# Patient Record
Sex: Female | Born: 1945 | Race: White | Hispanic: No | Marital: Married | Smoking: Current every day smoker
Health system: Southern US, Community
[De-identification: ages and names within clinical notes are randomized; demographics above are authoritative.]

## PROBLEM LIST (undated history)

## (undated) DIAGNOSIS — K219 Gastro-esophageal reflux disease without esophagitis: Secondary | ICD-10-CM

## (undated) DIAGNOSIS — T8859XA Other complications of anesthesia, initial encounter: Secondary | ICD-10-CM

## (undated) DIAGNOSIS — E781 Pure hyperglyceridemia: Secondary | ICD-10-CM

## (undated) DIAGNOSIS — T4145XA Adverse effect of unspecified anesthetic, initial encounter: Secondary | ICD-10-CM

## (undated) DIAGNOSIS — G4730 Sleep apnea, unspecified: Secondary | ICD-10-CM

## (undated) DIAGNOSIS — E039 Hypothyroidism, unspecified: Secondary | ICD-10-CM

## (undated) DIAGNOSIS — J449 Chronic obstructive pulmonary disease, unspecified: Secondary | ICD-10-CM

## (undated) DIAGNOSIS — H269 Unspecified cataract: Secondary | ICD-10-CM

## (undated) DIAGNOSIS — J45909 Unspecified asthma, uncomplicated: Secondary | ICD-10-CM

## (undated) DIAGNOSIS — R011 Cardiac murmur, unspecified: Secondary | ICD-10-CM

## (undated) DIAGNOSIS — F419 Anxiety disorder, unspecified: Secondary | ICD-10-CM

## (undated) DIAGNOSIS — E7800 Pure hypercholesterolemia, unspecified: Secondary | ICD-10-CM

## (undated) DIAGNOSIS — M797 Fibromyalgia: Secondary | ICD-10-CM

## (undated) DIAGNOSIS — I10 Essential (primary) hypertension: Secondary | ICD-10-CM

## (undated) DIAGNOSIS — R0602 Shortness of breath: Secondary | ICD-10-CM

## (undated) HISTORY — PX: ABDOMINAL HYSTERECTOMY: SHX81

## (undated) HISTORY — PX: CARPAL TUNNEL RELEASE: SHX101

## (undated) HISTORY — PX: TONSILLECTOMY: SUR1361

## (undated) HISTORY — PX: BACK SURGERY: SHX140

## (undated) HISTORY — PX: TUBAL LIGATION: SHX77

---

## 2002-09-25 ENCOUNTER — Encounter: Payer: Self-pay | Admitting: Neurosurgery

## 2002-09-26 ENCOUNTER — Ambulatory Visit (HOSPITAL_COMMUNITY): Admission: RE | Admit: 2002-09-26 | Discharge: 2002-09-26 | Payer: Self-pay | Admitting: Neurosurgery

## 2002-09-26 ENCOUNTER — Encounter: Payer: Self-pay | Admitting: Neurosurgery

## 2003-05-08 ENCOUNTER — Encounter: Payer: Self-pay | Admitting: Neurosurgery

## 2003-05-08 ENCOUNTER — Encounter: Admission: RE | Admit: 2003-05-08 | Discharge: 2003-05-08 | Payer: Self-pay | Admitting: Neurosurgery

## 2003-05-31 ENCOUNTER — Encounter: Payer: Self-pay | Admitting: Cardiology

## 2003-06-08 ENCOUNTER — Inpatient Hospital Stay (HOSPITAL_COMMUNITY): Admission: RE | Admit: 2003-06-08 | Discharge: 2003-06-10 | Payer: Self-pay | Admitting: Neurosurgery

## 2003-06-08 ENCOUNTER — Encounter: Payer: Self-pay | Admitting: Neurosurgery

## 2003-07-11 ENCOUNTER — Encounter: Payer: Self-pay | Admitting: Neurosurgery

## 2003-07-11 ENCOUNTER — Encounter: Admission: RE | Admit: 2003-07-11 | Discharge: 2003-07-11 | Payer: Self-pay | Admitting: Neurosurgery

## 2003-09-11 ENCOUNTER — Encounter: Admission: RE | Admit: 2003-09-11 | Discharge: 2003-09-11 | Payer: Self-pay | Admitting: Neurosurgery

## 2004-04-27 ENCOUNTER — Encounter: Payer: Self-pay | Admitting: Cardiology

## 2004-05-01 ENCOUNTER — Encounter: Payer: Self-pay | Admitting: Cardiology

## 2004-07-16 ENCOUNTER — Ambulatory Visit: Payer: Self-pay | Admitting: Cardiology

## 2004-07-17 ENCOUNTER — Encounter: Payer: Self-pay | Admitting: Cardiology

## 2004-07-21 ENCOUNTER — Inpatient Hospital Stay (HOSPITAL_BASED_OUTPATIENT_CLINIC_OR_DEPARTMENT_OTHER): Admission: RE | Admit: 2004-07-21 | Discharge: 2004-07-21 | Payer: Self-pay | Admitting: Cardiology

## 2004-07-28 ENCOUNTER — Ambulatory Visit: Payer: Self-pay | Admitting: Cardiology

## 2004-10-13 ENCOUNTER — Encounter: Payer: Self-pay | Admitting: Cardiology

## 2004-11-04 ENCOUNTER — Encounter: Admission: RE | Admit: 2004-11-04 | Discharge: 2004-11-04 | Payer: Self-pay | Admitting: Neurosurgery

## 2004-11-20 ENCOUNTER — Encounter: Admission: RE | Admit: 2004-11-20 | Discharge: 2004-11-20 | Payer: Self-pay | Admitting: Neurosurgery

## 2004-12-03 ENCOUNTER — Encounter: Admission: RE | Admit: 2004-12-03 | Discharge: 2004-12-03 | Payer: Self-pay | Admitting: Neurosurgery

## 2004-12-19 ENCOUNTER — Encounter: Admission: RE | Admit: 2004-12-19 | Discharge: 2004-12-19 | Payer: Self-pay | Admitting: Neurosurgery

## 2005-08-17 ENCOUNTER — Encounter: Admission: RE | Admit: 2005-08-17 | Discharge: 2005-08-17 | Payer: Self-pay | Admitting: Neurosurgery

## 2006-01-18 ENCOUNTER — Encounter
Admission: RE | Admit: 2006-01-18 | Discharge: 2006-01-18 | Payer: Self-pay | Admitting: Physical Medicine and Rehabilitation

## 2006-07-23 ENCOUNTER — Encounter: Admission: RE | Admit: 2006-07-23 | Discharge: 2006-07-23 | Payer: Self-pay | Admitting: Neurology

## 2006-11-03 ENCOUNTER — Ambulatory Visit: Payer: Self-pay | Admitting: Vascular Surgery

## 2006-11-03 ENCOUNTER — Ambulatory Visit (HOSPITAL_COMMUNITY): Admission: RE | Admit: 2006-11-03 | Discharge: 2006-11-03 | Payer: Self-pay | Admitting: Neurology

## 2007-04-10 ENCOUNTER — Emergency Department (HOSPITAL_COMMUNITY): Admission: EM | Admit: 2007-04-10 | Discharge: 2007-04-10 | Payer: Self-pay | Admitting: *Deleted

## 2007-04-19 ENCOUNTER — Encounter
Admission: RE | Admit: 2007-04-19 | Discharge: 2007-04-19 | Payer: Self-pay | Admitting: Physical Medicine and Rehabilitation

## 2007-12-28 ENCOUNTER — Encounter: Admission: RE | Admit: 2007-12-28 | Discharge: 2007-12-28 | Payer: Self-pay | Admitting: Neurosurgery

## 2008-01-30 ENCOUNTER — Inpatient Hospital Stay (HOSPITAL_COMMUNITY): Admission: RE | Admit: 2008-01-30 | Discharge: 2008-01-31 | Payer: Self-pay | Admitting: Neurosurgery

## 2008-06-17 ENCOUNTER — Encounter: Payer: Self-pay | Admitting: Cardiology

## 2008-06-20 ENCOUNTER — Encounter: Payer: Self-pay | Admitting: Cardiology

## 2008-06-26 ENCOUNTER — Encounter: Admission: RE | Admit: 2008-06-26 | Discharge: 2008-06-26 | Payer: Self-pay | Admitting: Neurosurgery

## 2008-09-03 IMAGING — CR DG CHEST 2V
2 series · 2 of 2 positions shown · non-contrast
Comparison: Report of a prior chest x-ray 09/25/2002.

CLINICAL DATA: Preop for spinal stenosis

CHEST - 2 VIEW

[view not recorded (1 of 2)]
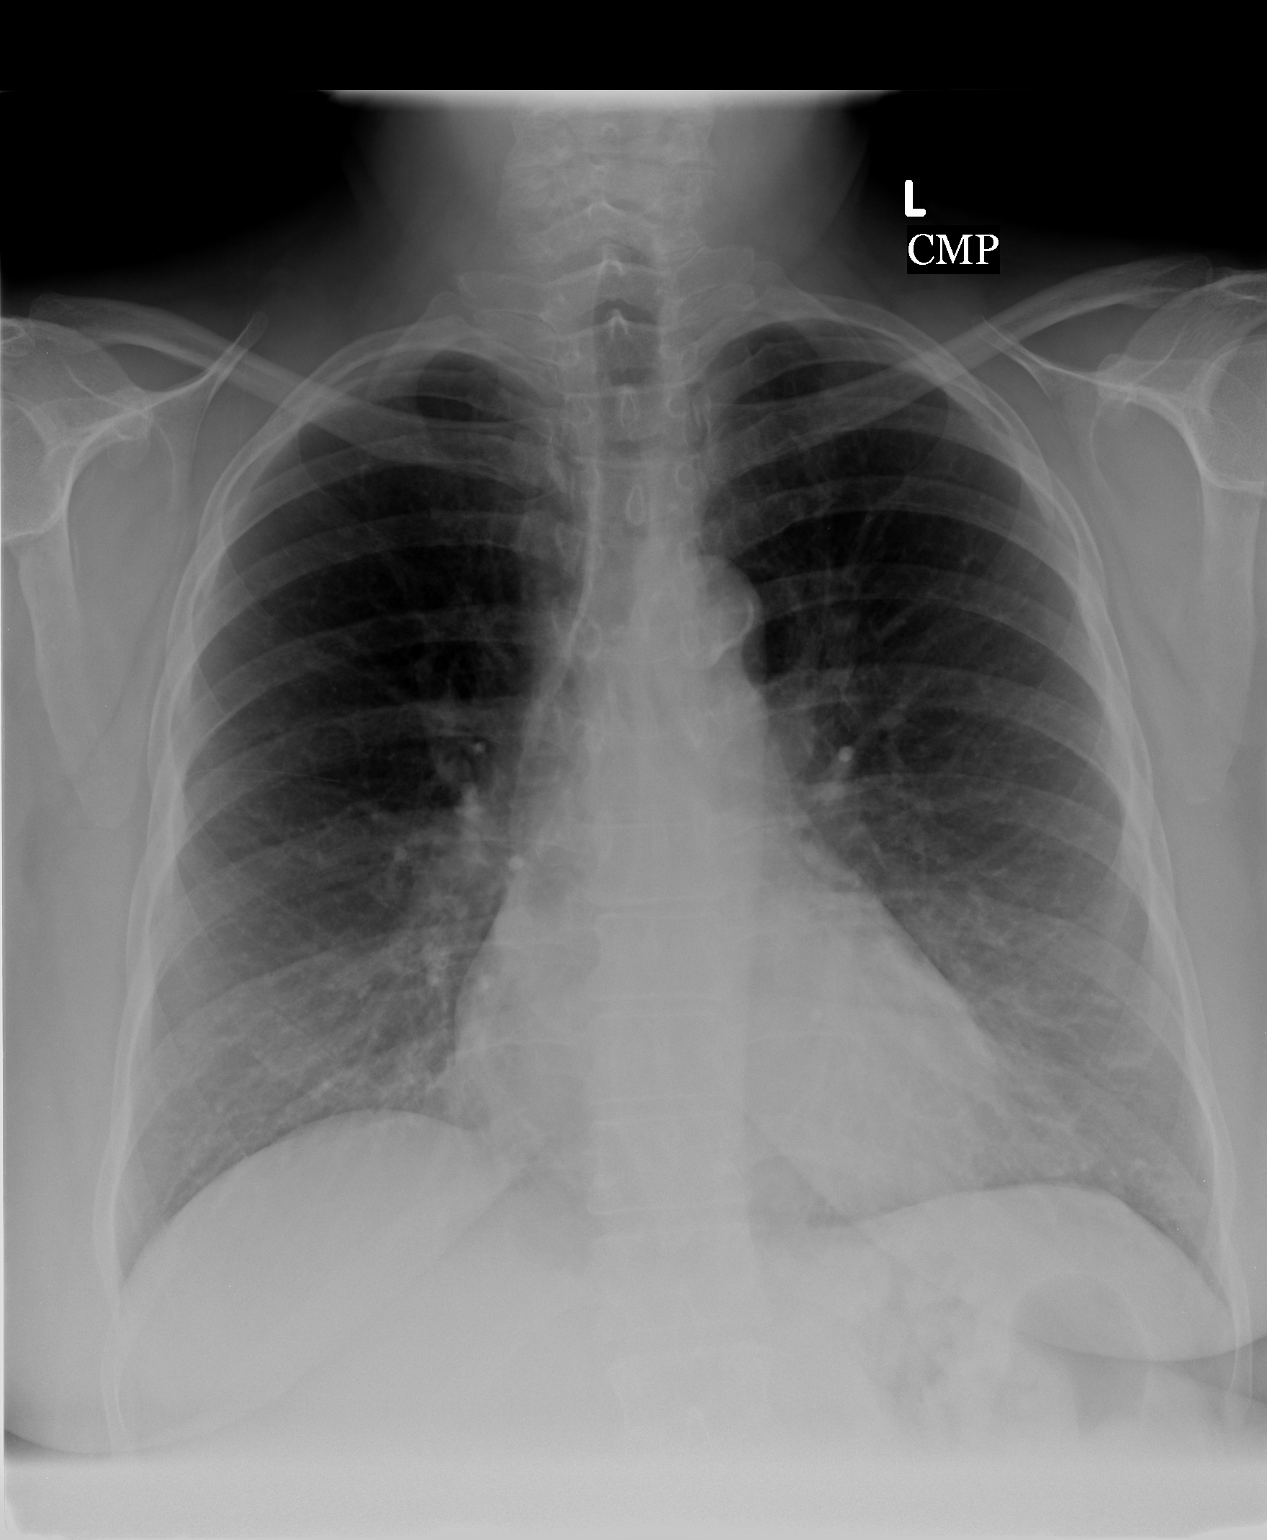

[view not recorded (2 of 2)]
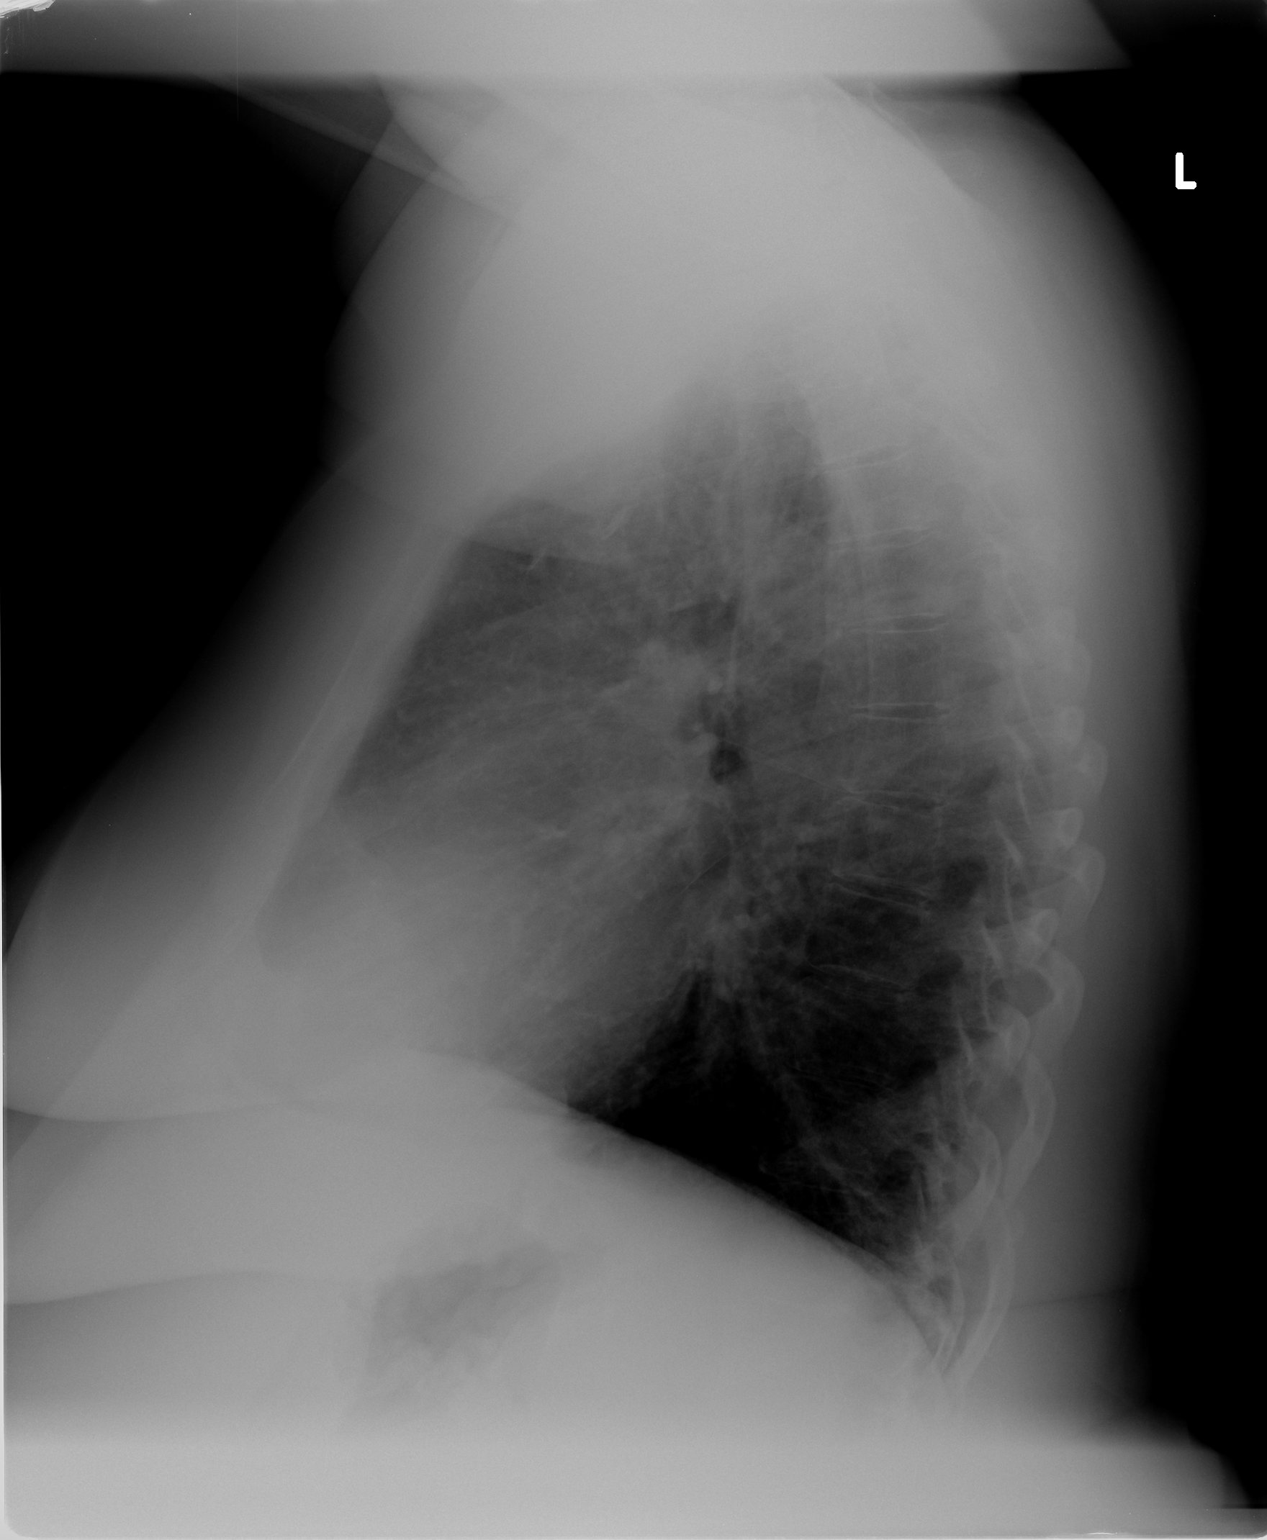

[2 of 2 positions shown; findings below may reference images not displayed]

FINDINGS: Heart size upper normal.  There are prominent lung
markings without active airspace disease.  The lungs are
hyperaerated.  There is no acute process.  Osseous structures
intact.
IMPRESSION: 1.  Heart size upper normal.
2.  Chronic lung changes.
3.  No active disease.

## 2009-04-22 ENCOUNTER — Encounter: Admission: RE | Admit: 2009-04-22 | Discharge: 2009-04-22 | Payer: Self-pay | Admitting: Neurosurgery

## 2009-07-29 ENCOUNTER — Inpatient Hospital Stay (HOSPITAL_COMMUNITY): Admission: RE | Admit: 2009-07-29 | Discharge: 2009-08-01 | Payer: Self-pay | Admitting: Neurosurgery

## 2009-08-21 ENCOUNTER — Encounter: Admission: RE | Admit: 2009-08-21 | Discharge: 2009-08-21 | Payer: Self-pay | Admitting: Neurosurgery

## 2009-11-08 ENCOUNTER — Encounter: Admission: RE | Admit: 2009-11-08 | Discharge: 2009-11-08 | Payer: Self-pay | Admitting: Neurosurgery

## 2009-12-19 ENCOUNTER — Encounter: Payer: Self-pay | Admitting: Cardiology

## 2010-01-08 ENCOUNTER — Encounter: Payer: Self-pay | Admitting: Cardiology

## 2010-03-12 ENCOUNTER — Encounter: Payer: Self-pay | Admitting: Cardiology

## 2010-03-21 ENCOUNTER — Encounter: Payer: Self-pay | Admitting: Cardiology

## 2010-04-08 DIAGNOSIS — I10 Essential (primary) hypertension: Secondary | ICD-10-CM | POA: Insufficient documentation

## 2010-04-08 DIAGNOSIS — F411 Generalized anxiety disorder: Secondary | ICD-10-CM | POA: Insufficient documentation

## 2010-04-08 DIAGNOSIS — R079 Chest pain, unspecified: Secondary | ICD-10-CM

## 2010-04-08 DIAGNOSIS — F17200 Nicotine dependence, unspecified, uncomplicated: Secondary | ICD-10-CM

## 2010-04-08 DIAGNOSIS — E039 Hypothyroidism, unspecified: Secondary | ICD-10-CM | POA: Insufficient documentation

## 2010-04-08 DIAGNOSIS — E119 Type 2 diabetes mellitus without complications: Secondary | ICD-10-CM

## 2010-04-11 ENCOUNTER — Ambulatory Visit: Payer: Self-pay | Admitting: Cardiology

## 2010-04-11 ENCOUNTER — Encounter (INDEPENDENT_AMBULATORY_CARE_PROVIDER_SITE_OTHER): Payer: Self-pay | Admitting: *Deleted

## 2010-04-11 DIAGNOSIS — E669 Obesity, unspecified: Secondary | ICD-10-CM

## 2010-04-11 DIAGNOSIS — R011 Cardiac murmur, unspecified: Secondary | ICD-10-CM

## 2010-04-11 DIAGNOSIS — E781 Pure hyperglyceridemia: Secondary | ICD-10-CM

## 2010-04-11 DIAGNOSIS — R0989 Other specified symptoms and signs involving the circulatory and respiratory systems: Secondary | ICD-10-CM | POA: Insufficient documentation

## 2010-04-14 ENCOUNTER — Encounter: Payer: Self-pay | Admitting: Cardiology

## 2010-04-24 ENCOUNTER — Ambulatory Visit: Payer: Self-pay | Admitting: Cardiology

## 2010-04-24 ENCOUNTER — Encounter: Payer: Self-pay | Admitting: Cardiology

## 2010-05-12 ENCOUNTER — Encounter: Payer: Self-pay | Admitting: Cardiology

## 2010-05-13 ENCOUNTER — Ambulatory Visit: Payer: Self-pay | Admitting: Cardiology

## 2010-05-13 DIAGNOSIS — R0609 Other forms of dyspnea: Secondary | ICD-10-CM

## 2010-05-15 ENCOUNTER — Encounter: Payer: Self-pay | Admitting: Cardiology

## 2010-05-28 ENCOUNTER — Telehealth (INDEPENDENT_AMBULATORY_CARE_PROVIDER_SITE_OTHER): Payer: Self-pay | Admitting: *Deleted

## 2010-08-29 ENCOUNTER — Encounter: Payer: Self-pay | Admitting: Cardiology

## 2010-10-28 NOTE — Progress Notes (Signed)
Summary: Office Visit/ LETTER TO DR. [facility]  Office Visit/ LETTER TO DR. [facility]   Imported By: Dorise Hiss 04/08/2010 14:32:05  _____________________________________________________________________  External Attachment:    Type:   Image     Comment:   External Document

## 2010-10-28 NOTE — Cardiovascular Report (Signed)
Summary: Cardiac Catheterization  Cardiac Catheterization   Imported By: Dorise Hiss 04/08/2010 14:07:40  _____________________________________________________________________  External Attachment:    Type:   Image     Comment:   External Document

## 2010-10-28 NOTE — Letter (Signed)
Summary: [facility] Medical Office Visit Note   [facility] Medical Office Visit Note   Imported By: Roderic Ovens 05/21/2010 11:37:06  _____________________________________________________________________  External Attachment:    Type:   Image     Comment:   External Document

## 2010-10-28 NOTE — Letter (Signed)
Summary: External Correspondence/ FAXED DR. Peterson Ao  External Correspondence/ FAXED DR. Peterson Ao   Imported By: Dorise Hiss 04/14/2010 12:09:33  _____________________________________________________________________  External Attachment:    Type:   Image     Comment:   External Document

## 2010-10-28 NOTE — Letter (Signed)
Summary: External Correspondence/ FAXED REFERRAL MEDICAL NUTRITION THERAP  External Correspondence/ FAXED REFERRAL MEDICAL NUTRITION THERAPY   Imported By: Dorise Hiss 05/23/2010 16:49:38  _____________________________________________________________________  External Attachment:    Type:   Image     Comment:   External Document

## 2010-10-28 NOTE — Assessment & Plan Note (Signed)
Summary: 1 MO F/U PER 7/15 OV-   Visit Type:  Follow-up Primary Provider:  Dr. Sherryll Burger  CC:  Hypertriglyceridemia.  History of Present Illness: The patient presents for followup of dyspnea and chest discomfort. She also has severe hypertriglyceridemia. I sent her to [facility] for management of this and she has been started on fabric acid and fish oil. I did order an echocardiogram which demonstrated normal left ventricular systolic function and no valvular abnormalities. Stress perfusion study suggested no ischemia. She did have some mild carotid plaque less than 50% on the left. She has had no change in her dyspnea and is not describing any PND or orthopnea. She is not having any chest pressure, neck or arm discomfort. She is not having any palpitations, presyncope or syncope. She continues to smoke cigarettes.  Preventive Screening-Counseling & Management  Alcohol-Tobacco     Smoking Status: current     Smoking Cessation Counseling: yes     Packs/Day: 1 PPD  Current Medications (verified): 1)  Klor-Con 10 10 Meq Cr-Tabs (Potassium Chloride) .... Take 1 Tablet By Mouth Once A Day 2)  Hyzaar 100-25 Mg Tabs (Losartan Potassium-Hctz) .... Take 1 Tablet By Mouth Once A Day 3)  Alprazolam 0.5 Mg Tabs (Alprazolam) .... Take 1 Tablet By Mouth Two Times A Day As Needed 4)  Proair Hfa 108 (90 Base) Mcg/act Aers (Albuterol Sulfate) .Marland Kitchen.. 1 Puffs Four Times A Day As Needed 5)  Spiriva Handihaler 18 Mcg Caps (Tiotropium Bromide Monohydrate) .... One Inhalation Daily 6)  Omeprazole 20 Mg Cpdr (Omeprazole) .... Take 1 Tablet By Mouth Two Times A Day 7)  Drisdol 04540 Unit Caps (Ergocalciferol) .... Take One By Mouth Weekly 8)  Metformin Hcl 500 Mg Tabs (Metformin Hcl) .... Take 1 Tablet By Mouth Three Times A Day 9)  Provigil 200 Mg Tabs (Modafinil) .... Take 1/2 Tablet By Mouth Once A Day 10)  Levothyroxine Sodium 150 Mcg Tabs (Levothyroxine Sodium) .... Take 1 Tablet By Mouth Once A Day 11)  Lyrica  75 Mg Caps (Pregabalin) .... Take 1 Tablet By Mouth Two Times A Day 12)  Amlodipine Besylate 5 Mg Tabs (Amlodipine Besylate) .... Take 1 Tablet By Mouth Once A Day 13)  Gabapentin 300 Mg Caps (Gabapentin) .... Take 1 Tablet By Mouth Three Times A Day 14)  Crestor 10 Mg Tabs (Rosuvastatin Calcium) .... Take 1 Tablet By Mouth Once A Day 15)  Metoprolol Succinate 100 Mg Xr24h-Tab (Metoprolol Succinate) .... Take 1 Tablet By Mouth Two Times A Day 16)  Furosemide 40 Mg Tabs (Furosemide) .... Take 1 Tablet By Mouth Once A Day 17)  Nabumetone 750 Mg Tabs (Nabumetone) .... Take 1 Tablet By Mouth Two Times A Day 18)  Cymbalta 60 Mg Cpep (Duloxetine Hcl) .... Take 1 Tablet By Mouth Once A Day 19)  Morphine Sulfate 15 Mg Tabs (Morphine Sulfate) .... Take 1 Tablet By Mouth Three Times A Day 20)  Morphine Sulfate 30 Mg Tabs (Morphine Sulfate) .... Take 1 Tablet By Mouth Three Times A Day  Allergies (verified): No Known Drug Allergies  Comments:  Nurse/Medical Assistant: The patient's medications and allergies were reviewed with the patient and were updated in the Medication and Allergy Lists.  Past History:  Past Medical History: Reviewed history from 04/11/2010 and no changes required. Hypertension Hypothyroidism Hypertriglyceridemia Chronic back pain Tobacco abuse Diabetes mellitus Sleep apnea Morbid obesity  Past Surgical History: Reviewed history from 04/11/2010 and no changes required. Cardiac Catheterization-10/13/2004 Left L3-4 extraforaminal microdiskectomy. 2003 L3-4  stenosis status post L4-5 fusion.   Social History: Packs/Day:  1 PPD  Review of Systems       As stated in the HPI and negative for all other systems.   Vital Signs:  Patient profile:   65 year old female Height:      61 inches Weight:      181 pounds Pulse rate:   82 / minute BP sitting:   130 / 75  (left arm) Cuff size:   large  Vitals Entered By: Carlye Grippe (May 13, 2010 11:50 AM)  Physical  Exam  General:  Well developed, well nourished, in no acute distress. Head:  normocephalic and atraumatic Neck:  Neck supple, no JVD. No masses, thyromegaly or abnormal cervical nodes. Chest Wall:  no deformities or breast masses noted Lungs:  Clear bilaterally to auscultation and percussion. Abdomen:  Bowel sounds positive; abdomen soft and non-tender without masses, organomegaly, or hernias noted. No hepatosplenomegaly, obese Msk:  Back normal, normal gait. Muscle strength and tone normal. Extremities:  No clubbing or cyanosis. Neurologic:  Alert and oriented x 3. Psych:  Normal affect.   Detailed Cardiovascular Exam  Neck    Carotids: right carotid bruit versus transmitted systolic murmur, otherwise unremarkable    Neck Veins: Normal, no JVD.    Heart    Inspection: no deformities or lifts noted.      Palpation: normal PMI with no thrills palpable.      Auscultation: S1 and S2 within normal limits, no S3, no S4, 2/6 apical systolic murmur heard best at the right upper sternal border, early peaking, no diastolic murmurs.  Vascular    Abdominal Aorta: no palpable masses, pulsations, or audible bruits.      Femoral Pulses: normal femoral pulses bilaterally.      Pedal Pulses: normal pedal pulses bilaterally.      Radial Pulses: normal radial pulses bilaterally.      Peripheral Circulation: no clubbing, cyanosis, or edema noted with normal capillary refill.     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 We will continue with risk reduction.  Problem # 2:  TOBACCO ABUSE (ICD-305.1) I spent greater than 3 minutes discussing the need to stop smoking. Hopefully she will comply.  Problem # 3:  HYPERTENSION (ICD-401.9) Her blood pressure is controlled. She will continue the meds as listed.  Problem # 4:  HYPERTRIGLYCERIDEMIA (ICD-272.1) Her lipids will be followed by the lipid clinic at  [facility]. She has labs scheduled for October.  Problem # 5:  DYSPNEA (ICD-786.05) This is likely related to her long-standing smoking. No further cardiac workup is suggested.  Patient Instructions: 1)  Your physician wants you to follow-up in: 6 months. You will receive a reminder letter in the mail one-two months in advance. If you don't receive a letter, please call our office to schedule the follow-up appointment. 2)  Your physician recommends that you continue on your current medications as directed. Please refer to the Current Medication list given to you today. 3)  Your physician discussed the hazards of tobacco use.  Tobacco use cessation is recommended and techniques and options to help you quit were discussed.  Appended Document: Orders Update    Clinical Lists Changes  Orders: Added new Referral order of Nutrition Referral (Nutrition) - Signed

## 2010-10-28 NOTE — Letter (Signed)
Summary: [facility] D/C DR. Beatrix Fetters [facility]  [facility] D/C DR. Kirstie Peri   Imported By: Zachary George 04/08/2010 13:14:31  _____________________________________________________________________  External Attachment:    Type:   Image     Comment:   External Document

## 2010-10-28 NOTE — Progress Notes (Signed)
Summary: Nutritional Referral  Phone Note Outgoing Call   Call placed by: Cyril Loosen, RN, BSN,  May 28, 2010 12:04 PM Call placed to: Patient Summary of Call: Spoke with patient who states nutritional appt has been set up. Initial call taken by: Cyril Loosen, RN, BSN,  May 28, 2010 12:04 PM

## 2010-10-28 NOTE — Assessment & Plan Note (Signed)
Summary: NP-FOLLOW UP ON TRIGLYCERIDES ELEV   Visit Type:  Initial Consult Primary Provider:  Dr. Sherryll Burger  CC:  Chest pain and SOB .  History of Present Illness: The patient presents for evaluation of shortness of breath and chest discomfort and also for management of severe hypertriglyceridemia. She has had cardiovascular workups in the past for complaints of chest discomfort. Treadmill in 2005 was negative. She subsequently had a cardiac catheterization in 2006 demonstrating scattered nonobstructive plaque. She presents now with progressive dyspnea. This has been slow over years but now she is dyspneic to the point of being short of breath walking to her house. She does not describe resting shortness of breath, PND or orthopnea. She does occasionally have some palpitations but no presyncope or syncope. She has chest discomfort that is alternately sharp and heavy. This happens sporadically. She cannot bring this on she is very limited in activity secondary to chronic back pain. She does not describe associated symptoms with this. She does not describe radiation of discomfort to her jaw or arms though she will have neck pain associated with what she thinks her muscle spasms.  Of note the patient has a history of hypertriglyceridemia. I did call the primary care office and reviewed labs that included a total cholesterol of 213, HDL 16, triglycerides 1494. Direct LDL was not obtained. TSH was borderline at 0.4. Sodium was 133.    Preventive Screening-Counseling & Management  Alcohol-Tobacco     Smoking Status: current     Smoking Cessation Counseling: yes     Packs/Day: 1&1/2 PPD  Current Medications (verified): 1)  Klor-Con 10 10 Meq Cr-Tabs (Potassium Chloride) .... Take 1 Tablet By Mouth Once A Day 2)  Hyzaar 100-25 Mg Tabs (Losartan Potassium-Hctz) .... Take 1 Tablet By Mouth Once A Day 3)  Alprazolam 0.5 Mg Tabs (Alprazolam) .... Take 1 Tablet By Mouth Two Times A Day As Needed 4)  Proair  Hfa 108 (90 Base) Mcg/act Aers (Albuterol Sulfate) .Marland Kitchen.. 1 Puffs Four Times A Day As Needed 5)  Spiriva Handihaler 18 Mcg Caps (Tiotropium Bromide Monohydrate) .... One Inhalation Daily 6)  Omeprazole 20 Mg Cpdr (Omeprazole) .... Take 1 Tablet By Mouth Two Times A Day 7)  Drisdol 16109 Unit Caps (Ergocalciferol) .... Take One By Mouth Weekly 8)  Metformin Hcl 500 Mg Tabs (Metformin Hcl) .... Take 1 Tablet By Mouth Three Times A Day 9)  Provigil 200 Mg Tabs (Modafinil) .... Take 1/2 Tablet By Mouth Once A Day 10)  Levothyroxine Sodium 150 Mcg Tabs (Levothyroxine Sodium) .... Take 1 Tablet By Mouth Once A Day 11)  Lyrica 75 Mg Caps (Pregabalin) .... Take 1 Tablet By Mouth Two Times A Day 12)  Amlodipine Besylate 5 Mg Tabs (Amlodipine Besylate) .... Take 1 Tablet By Mouth Once A Day 13)  Gabapentin 300 Mg Caps (Gabapentin) .... Take 1 Tablet By Mouth Three Times A Day 14)  Crestor 10 Mg Tabs (Rosuvastatin Calcium) .... Take 1 Tablet By Mouth Once A Day 15)  Metoprolol Succinate 100 Mg Xr24h-Tab (Metoprolol Succinate) .... Take 1 Tablet By Mouth Two Times A Day 16)  Furosemide 40 Mg Tabs (Furosemide) .... Take 1 Tablet By Mouth Once A Day 17)  Nabumetone 750 Mg Tabs (Nabumetone) .... Take 1 Tablet By Mouth Two Times A Day 18)  Cymbalta 60 Mg Cpep (Duloxetine Hcl) .... Take 1 Tablet By Mouth Once A Day 19)  Morphine Sulfate 15 Mg Tabs (Morphine Sulfate) .... Take 1 Tablet By Mouth Three Times  A Day 20)  Morphine Sulfate 30 Mg Tabs (Morphine Sulfate) .... Take 1 Tablet By Mouth Three Times A Day  Allergies (verified): No Known Drug Allergies  Comments:  Nurse/Medical Assistant: The patient's medication bottles and allergies were reviewed with the patient and were updated in the Medication and Allergy Lists.  Past History:  Past Medical History: Hypertension Hypothyroidism Hypertriglyceridemia Chronic back pain Tobacco abuse Diabetes mellitus Sleep apnea Morbid obesity  Past Surgical  History: Cardiac Catheterization-10/13/2004 Left L3-4 extraforaminal microdiskectomy. 2003 L3-4 stenosis status post L4-5 fusion.   Family History: Father deceased lung CA Mother:deceased GSW No early heart disease  Social History: Disabled  Alcohol Use - no Drug Use - no Tobacco 1 1/2 ppd x 36 years Smoking Status:  current Packs/Day:  1&1/2 PPD  Review of Systems       Positive for hand and feet swelling, joint pains, fatigue, sweats, headaches, 30 pound weight gain in 8 years, cataracts, anxiety, diffuse weakness, asthma, wheezing, frequent cough, stomach cramping. Otherwise as stated in the history of present illness negative for other systems.  Vital Signs:  Patient profile:   65 year old female Height:      61 inches Weight:      185 pounds BMI:     35.08 O2 Sat:      94 % on Room air Pulse rate:   78 / minute BP sitting:   139 / 79  (left arm) Cuff size:   large  Vitals Entered By: Carlye Grippe (April 11, 2010 1:27 PM)  Nutrition Counseling: Patient's BMI is greater than 25 and therefore counseled on weight management options.  O2 Flow:  Room air CC: Chest pain, SOB    Physical Exam  General:  Well developed, well nourished, in no acute distress. Head:  normocephalic and atraumatic Eyes:  PERRLA/EOM intact; conjunctiva and lids normal. Mouth:  Teeth, gums and palate normal. Oral mucosa normal. Neck:  Neck supple, no JVD. No masses, thyromegaly or abnormal cervical nodes. Chest Wall:  no deformities or breast masses noted Lungs:  Clear bilaterally to auscultation and percussion. Abdomen:  Bowel sounds positive; abdomen soft and non-tender without masses, organomegaly, or hernias noted. No hepatosplenomegaly, obese Msk:  Back normal, normal gait. Muscle strength and tone normal. Extremities:  No clubbing or cyanosis. Neurologic:  Alert and oriented x 3. Skin:  Intact without lesions or rashes. Cervical Nodes:  no significant adenopathy Axillary Nodes:  no  significant adenopathy Inguinal Nodes:  no significant adenopathy Psych:  Normal affect.   Detailed Cardiovascular Exam  Neck    Carotids: right carotid bruit versus transmitted systolic murmur, otherwise unremarkable    Neck Veins: Normal, no JVD.    Heart    Inspection: no deformities or lifts noted.      Palpation: normal PMI with no thrills palpable.      Auscultation: S1 and S2 within normal limits, no S3, no S4, 2/6 apical systolic murmur heard best at the right upper sternal border, early peaking, no diastolic murmurs.  Vascular    Abdominal Aorta: no palpable masses, pulsations, or audible bruits.      Femoral Pulses: normal femoral pulses bilaterally.      Pedal Pulses: normal pedal pulses bilaterally.      Radial Pulses: normal radial pulses bilaterally.      Peripheral Circulation: no clubbing, cyanosis, or edema noted with normal capillary refill.     EKG  Procedure date:  04/11/2010  Findings:      Sinus rhythm,  rate 79, axis within normal limits, intervals within normal limits, no acute ST-T wave changes.  Impression & Recommendations:  Problem # 1:  CHEST PAIN UNSPECIFIED (ICD-786.50) The patient has atypical chest pain. She does have progressive dyspnea which could be an anginal equivalent. She had some nonobstructive coronary disease several years ago and catheterization and significant ongoing risk factors. Stress testing is indicated but she would not be able to ambulate. Therefore, she can have a pharmacologic perfusion study Orders: EKG w/ Interpretation (93000) Nuclear Med (Nuc Med)  Problem # 2:  TOBACCO ABUSE (ICD-305.1) We had a long discussion about the need to stop smoking. We gave her a telephone number for support and help. She has failed pharmacologic and nicotine replacement therapies.  Problem # 3:  HYPERTRIGLYCERIDEMIA (ICD-272.1) This is a significant problem and puts her at risk for among other things pancreatitis.  I discussed at length  the need for a very low fat diet. Complicating her treatment is her polypharmacy. In addition she has diabetes. However, she will need combination therapy with niacin and fibroids. I think with the degree of her hypertriglyceridemia she would best be served in the lipid clinic and I have made this followup to [facility]. Orders: Misc. Referral (Misc. Ref)  Problem # 4:  CAROTID BRUIT (ICD-785.9) This may represent a transmitted murmur. However, she has significant vascular risk factors and so carotid Doppler is indicated. Orders: Carotid Duplex (Carotid Duplex)  Problem # 5:  MURMUR (ICD-785.2) I suspect aortic sclerosis or mild stenosis and will follow with an echocardiogram. Orders: 2-D Echocardiogram (2D Echo)  Problem # 6:  OBESITY, UNSPECIFIED (ICD-278.00) Frankly, and unfortunately, this is not her most significant problem and for now I will discuss it with her blood plan no specific therapy.  Patient Instructions: 1)  Your physician has requested that you have a carotid duplex. This test is an ultrasound of the carotid arteries in your neck. It looks at blood flow through these arteries that supply the brain with blood. Allow one hour for this exam. There are no restrictions or special instructions. 2)  Your physician has requested that you have an echocardiogram.  Echocardiography is a painless test that uses sound waves to create images of your heart. It provides your doctor with information about the size and shape of your heart and how well your heart's chambers and valves are working.  This procedure takes approximately one hour. There are no restrictions for this procedure. 3)  Your physician has requested that you have an Best boy.  For further information please visit [email or web address]  Please follow instruction sheet, as given. 4)  You have been referred to Lipid Clinic at [facility] are scheduled with Dr. Garey Ham on Monday, May 12, 2010 at  12:55pm. Their contact number is [phone]. They will mail you a new patient packet with a map for their location. 5)  Follow up with Dr. Antoine Poche on Tues, May 13, 2010 at 11:45am. 6)  Your physician discussed the hazards of tobacco use.  Tobacco use cessation is recommended and techniques and options to help you quit were discussed. 1-800-QUIT-NOW

## 2010-10-28 NOTE — Letter (Signed)
Summary: External Correspondence/ [facility]  External Correspondence/ [facility]   Imported By: Dorise Hiss 03/24/2010 09:21:43  _____________________________________________________________________  External Attachment:    Type:   Image     Comment:   External Document

## 2010-10-28 NOTE — Letter (Signed)
Summary: Lexiscan or Dobutamine Pharmacist, community at [facility]  [address]   [place], [place] [address]   Phone: [phone]  Fax: [phone]      [facility] Cardiovascular Services  Lexiscan or Dobutamine Cardiolite Strss Test    Patricia Stein  Appointment Date:_  Appointment Time:_  Your doctor has ordered a CARDIOLITE STRESS TEST using a medication to stimulate exercise so that you will not have to walk on the treadmill to determine the condition of your heart during stress. If you take blood pressure medication, ask your doctor if you should take it the day of your test. You should not have anything to eat or drink at least 4 hours before your test is scheduled, and no caffeine, including decaffeinated tea and coffee, chocolate, and soft drinks for 24 hours before your test.  You will need to register at the Outpatient/Main Entrance at the hospital 15 minutes before your appointment time. It is a good idea to bring a copy of your order with you. They will direct you to the Diagnostic Imaging (Radiology) Department.  You will be asked to undress from the waist up and given a hospital gown to wear, so dress comfortably from the waist down for example: Sweat pants, shorts, or skirt Rubber soled lace up shoes (tennis shoes)  Plan on about three hours from registration to release from the hospital  You may take all of your medications with water the morning of your test. However, if you do not eat breakfast the morning of your test, you may want to hold your diabetic medications until after the test. Also, you may want to hold Lasix the morning of the test. You can take these medications after the test.

## 2010-10-28 NOTE — Letter (Signed)
Summary: External Correspondence/ [facility]  External Correspondence/ [facility]   Imported By: Dorise Hiss 03/24/2010 09:20:13  _____________________________________________________________________  External Attachment:    Type:   Image     Comment:   External Document

## 2010-11-17 ENCOUNTER — Encounter: Payer: Self-pay | Admitting: Cardiology

## 2010-12-04 NOTE — Letter (Signed)
Summary: [facility]  [facility]   Imported By: Marylou Mccoy 11/28/2010 11:17:46  _____________________________________________________________________  External Attachment:    Type:   Image     Comment:   External Document

## 2010-12-31 LAB — GLUCOSE, CAPILLARY
Glucose-Capillary: 114 mg/dL — ABNORMAL HIGH (ref 70–99)
Glucose-Capillary: 120 mg/dL — ABNORMAL HIGH (ref 70–99)
Glucose-Capillary: 122 mg/dL — ABNORMAL HIGH (ref 70–99)
Glucose-Capillary: 134 mg/dL — ABNORMAL HIGH (ref 70–99)
Glucose-Capillary: 153 mg/dL — ABNORMAL HIGH (ref 70–99)

## 2011-01-01 LAB — BASIC METABOLIC PANEL
BUN: 8 mg/dL (ref 6–23)
Creatinine, Ser: 0.57 mg/dL (ref 0.4–1.2)
GFR calc Af Amer: >60 mL/min (ref >60)
GFR calc non Af Amer: >60 mL/min (ref >60)
Potassium: 3.6 mEq/L (ref 3.5–5.1)

## 2011-01-01 LAB — TYPE AND SCREEN
ABO/RH(D): O POS
Antibody Screen: NEGATIVE

## 2011-01-01 LAB — DIFFERENTIAL
Basophils Absolute: 0 10*3/uL (ref 0.0–0.1)
Basophils Relative: 0 % (ref 0–1)
Monocytes Absolute: 0.5 10*3/uL (ref 0.1–1.0)
Neutro Abs: 5.3 10*3/uL (ref 1.7–7.7)
Neutrophils Relative %: 60 % (ref 43–77)

## 2011-01-01 LAB — CBC
MCHC: 35.2 g/dL (ref 30.0–36.0)
Platelets: 170 10*3/uL (ref 150–400)
RDW: 14.6 % (ref 11.5–15.5)

## 2011-02-10 NOTE — Op Note (Signed)
Patricia Stein, Patricia Stein NO.:  [id number]   MEDICAL RECORD NO.:  [id number]          PATIENT TYPE:  INP   LOCATION:  3022                         FACILITY:  [facility]   PHYSICIAN:  Kathaleen Maser. Pool, M.D.    DATE OF BIRTH:  Apr 15, 1946   DATE OF PROCEDURE:  01/30/2008  DATE OF DISCHARGE:                               OPERATIVE REPORT   PREOPERATIVE DIAGNOSES:  L4-L5 degenerative disease with stenosis,  instability, status post L5-S1 decompression and fusion instrumentation.   POSTOPERATIVE DIAGNOSES:  L4-L5 degenerative disease with stenosis,  instability, status post L5-S1 decompression and fusion instrumentation.   PROCEDURE NOTE:  L4-L5 re-exploration of laminectomy with a redo  complete laminectomy with L4 and L5 foraminotomies, more than will be  required for simple interbody fusion alone.  L4-L5 posterior lumbar  revision utilizing tangent interbody allograft wedge, Telamon interbody  PEEK cage, and local autografting.  L4-L5 posterolateral arthrodesis  utilizing non-segmental pedicle fixation and local autografting.  Re-  exploration of L5-S1 fusion with removal of hardware.   SURGEON:  Kathaleen Maser. Pool, MD   ANESTHESIA:  General endotracheal.   INDICATION:  Ms. Tlatelpa is a 65 year old female status post previous L5-  S1 fusion.  Workup demonstrates evidence of instability and stenosis at  the L4-L5 level.  The patient has failed conservative management.  She  decides to proceed with L4-L5 decompression and fusion hopes of  improving her symptoms.   OPERATION PERFORMED:  The patient was brought to the operating room and  placed on the operating table in supine position.  After an adequate  level was achieved, the patient was prone onto Wilson frame firmly  padded.  The patient's lumbar regions prepped and draped sterilely.  A  10-blade was used to make curvilinear incision overlying the L4-L5  interspace.  This carried out sharply to the midline.  Subperiosteal  dissection was then performed exposing the lamina and facet joints of L3  and L4 as well as the previous laminectomy of L4-L5, and the pedicle  screw fixation at L4-L5.  A deep self-retainer was placed.  Intraoperative fluoroscopy was used and levels were confirmed.  Pedicle  screw fixation at L5-S1 was disassembled.  The fusion was explored at L5-  S1 and found to be solid.  Pedicle screws at S1 were removed.  Pedicle  screws at L5 were left in place.  The previous laminectomy at L5 was  dissected free to the level of inferior lamina of L4.  Complete  laminectomy of L4 was then performed using Leksell rongeurs, Kerrison  rongeurs, and high-speed drill.  All elements of the lamina were  completely removed.  All inferior facetectomies of L4 were performed  bilaterally.  Superior facetectomies of L5 were performed bilaterally.  Ligament flavum and epidural scar was elevated and resected in the usual  fashion using Kerrison rongeurs.  Wide decompressive foraminotomy was  then performed along course of exiting L4 and L5 nerve roots  bilaterally.  Epidural venous plexus was coagulated and cut.  So, I  infused the patient's left side thecal sac nerve roots gently and  mobilized, and  tracked towards the middle.  Disk space was then incised  with 15-blade in a rectangular fashion to widen disk space.  Clean-out  was achieved.  Using pituitary rongeurs, upward angled pituitary  rongeurs, and Epstein curettes.  Procedure was then repeated on the  contralateral side.  Disk space was then distracted up to 10 mm and 10-  mm distractor was left in place.  On starting first, distractor on the  patient's right side, thecal sac nerve root was checked in the left  side.  Disk space was then reamed and cut with a 10-mm tangent  instrument.  Soft tissue was then removed from the interspace.  A 10 x  26-mm Telamon cage packed with morselized autograft and Pro[employer]ix putty  was then packed into place, recessed  approximately 1 mm from posterior  cortical margin of L4.  Distractor was removed from the patient's right  side of L4-L5.  Disk space was then reamed and cut with a 10-mm tangent  instruments.  Soft tissues was then removed from the interspace.  A  morselized autograft was then packed in the interspace and then a 10 x  26-mm tangent wedge was then packed into place, and recessed  approximately 1 mm from the posterior cortical margin of L4.  Pedicles  of L4 were then identified using surface landmarks with an  intraoperative fluoroscopy.  Superficial bone around the pedicle was  then removed using high-speed drill.  The pedicles was then probed using  pedicle awl.  Each pedicle awl tract was then tapped 5.25 mm screw tap.  Each screw tap hole was probed and found to be solid within bone.  The  6.75 x 45-mm radius screws were placed bilaterally at L4.  Transverse  processes were then decorticated at L4 and L5.  Morselized autograft was  packed posterolaterally for later fusion.  Short segment of titanium  rods were then placed over the screw heads.  Locking caps were then  placed over the screw head.  Locking caps then engaged with construct  under compression.  Final images revealed a good position of bone grafts  and hardware with proper operative level and normal alignment of spine.  Wound was then irrigated with antibiotic solution.  Gelfoam was placed  topically.  Hemostasis was found to be good.  A medium Hemovac drain was  left in the epidural space.  The wounds was then closed in layers with  Vicryl suture.  Steri-Strips and sterile dressings were applied.  There  are no complications.  She tolerated the procedure well and she returns  to recovery room postoperatively.           ______________________________  Kathaleen Maser Pool, M.D.     HAP/MEDQ  D:  01/30/2008  T:  01/31/2008  Job:  [id number]

## 2011-02-13 NOTE — Cardiovascular Report (Signed)
Patricia Stein, Patricia Stein NO.:  [id number]   MEDICAL RECORD NO.:  [id number]          PATIENT TYPE:  OIB   LOCATION:  6501                         FACILITY:  [facility]   PHYSICIAN:  Learta Codding, M.D. LHCDATE OF BIRTH:  [birth year]   DATE OF PROCEDURE:  10/13/2004  DATE OF DISCHARGE:  07/21/2004                              CARDIAC CATHETERIZATION   PROCEDURE:  1.  Left heart catheterization with selective coronary angiography.  2.  Ventriculography.   DIAGNOSIS:  Nonobstructive coronary artery disease.   INDICATIONS FOR PROCEDURE:  The patient is a 65 year old female with history  of substernal chest pain.  The patient has been referred for cardiac  catheterization to evaluate her coronary anatomy.   DESCRIPTION OF PROCEDURE:  After informed consent was obtained, the patient  was brought to the catheterization laboratory.  Standard catheters were used  for coronary angiography.  Ventriculography was performed in a single-plane  RAO projection.  No complications were occurred during the procedure.   FINDINGS:  1.  Hemodynamics:  Left ventricular pressure 119/8 mmHg, arterial pressure      119/70 mmHg.  2.  Ventriculography:  Ejection fraction 60%, no wall motion abnormalities.  3.  Selective coronary angiography:  The left main coronary artery was a      large caliber vessel with no evidence of flow limiting disease.  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  The diagonal      branch was free of flow limiting disease.  The circumflex coronary      artery had a proximal 30% stenosis.  The marginal branches were free of      flow-limiting disease.  Right coronary artery is a large caliber vessel      with some narrowing of approximately 20 to 30% in the midsection of the      vessel.   RECOMMENDATIONS:  Angiographic images were reviewed and relayed to Dr. Sherryll Burger.  The  patient will be switched from Lisinopril to an ARB secondary to cough.  Continue medical therapy for hypertriglyceridemia and hypercholesterolemia  as indicated.      GED/MEDQ  D:  10/13/2004  T:  10/13/2004  Job:  [id number]

## 2011-02-13 NOTE — Op Note (Signed)
NAME:  Patricia Stein, Patricia Stein                        ACCOUNT NO.:  [id number]   MEDICAL RECORD NO.:  [id number]                   PATIENT TYPE:  OIB   LOCATION:  3172                                 FACILITY:  [facility]   PHYSICIAN:  Kathaleen Maser. Pool, M.D.                 DATE OF BIRTH:  August 04, 1946   DATE OF PROCEDURE:  09/26/2002  DATE OF DISCHARGE:                                 OPERATIVE REPORT   PREOPERATIVE DIAGNOSES:  Left L3-4 extraforaminal herniated nucleus pulposus  with radiculopathy.   POSTOPERATIVE DIAGNOSES:  Left L3-4 extraforaminal herniated nucleus  pulposus with radiculopathy.   OPERATION PERFORMED:  Left L3-4 extraforaminal microdiskectomy.   SURGEON:  Kathaleen Maser. Pool, M.D.   ASSISTANT:  Donalee Citrin, M.D.   ANESTHESIA:  General endotracheal.   INDICATIONS FOR PROCEDURE:  The patient is a 65 year old female with a  history of back and left lower extremity pain consistent with left-sided L3  radiculopathy which has failed conservative management.  MRI scan  demonstrates a focal left-sided L3-4 extraforaminal disk herniation with  compression exiting left-sided L3 nerve root.  We discussed the options  available for management.  The patient has decided to proceed with  extraforaminal microdiskectomy for hopeful improvement in her symptoms.   DESCRIPTION OF PROCEDURE:  The patient was taken to the operating room and  placed on the table in the supine position.  After adequate level of  anesthesia was achieved, the patient was positioned prone onto a Wilson  frame and appropriately padded.  The patient's lumbar region was prepped and  draped sterilely.  A 10 blade was used to make a linear skin incision  overlying the  interspace.  This was carried down sharply in the midline.  A  subperiosteal dissection was then performed exposing the lamina and facet  joints of L3 and L4 as well as the transverse process at L3.  Deep self-  retaining retractor was placed.  Intraoperative  x-ray was taken, the level  was confirmed.  Extraforaminal approach was then made using the high speed  drill to remove the lateral aspect of the inferior facet of L3 and the  superior facet of L4 as well as the lateral aspect of the pars  interarticularis.  The intertransverse ligament was then elevated and  resected in piecemeal fashion using Kerrison rongeurs.   The microscope was brought into the field and used for microdissection of  the left-sided L3 nerve root and disk herniation.  After clamp was placed  this was coagulated and cut.  The L3 nerve root was identified.  It was  mobilized gradually superiorly.  Disk herniation was readily apparent.  This  was incised with a 15 blade in rectangular fashion.  A wide disk space  cleanout was then achieved pituitary rongeurs and upward angled pituitary  rongeurs and Epstein curets.  All elements of the disk herniation were  completely resected.  The  disk space was entered and all loose or obviously  degenerative disk material was removed from within the disk space.  At this  point a very thorough diskectomy had been performed.  There was no evidence  of any further compression of thecal sac or nerve roots.  The wound was then  irrigated with antibiotic solution.  Gelfoam was placed topically for  hemostasis which was found to be good.  Microscope and retractor system were  removed.  Hemostasis of the muscle was achieved with electrocautery.  The  wound was then closed in layers with Vicryl sutures.  Steri-Strips and  sterile  dressing were applied.  There were no apparent complications.  The patient  tolerated the procedure well and returned to the recovery room  postoperatively.                                                Henry A. Pool, M.D.    HAP/MEDQ  D:  09/26/2002  T:  09/26/2002  Job:  [id number]

## 2011-02-13 NOTE — Op Note (Signed)
NAME:  Patricia Stein, Patricia Stein                        ACCOUNT NO.:  [id number]   MEDICAL RECORD NO.:  [id number]                   PATIENT TYPE:  INP   LOCATION:  3010                                 FACILITY:  [facility]   PHYSICIAN:  Kathaleen Maser. Pool, M.D.                 DATE OF BIRTH:  [birth year]   DATE OF PROCEDURE:  06/08/2003  DATE OF DISCHARGE:                                 OPERATIVE REPORT   PREOPERATIVE DIAGNOSIS:  L5-S1 degenerative disk disease with chronic pain.   POSTOPERATIVE DIAGNOSIS:  L5-S1 degenerative disk disease with chronic pain.   PROCEDURES:  1. L5-S1 decompressive laminectomy with foraminotomies, followed by     posterior lumbar fusion utilizing Tangent wedges and local autograft.  2. Posterolateral fusion using pedicle screw instrumentation and local     autograft.   SURGEON:  Kathaleen Maser. Pool, M.D.   ASSISTANT:  Donalee Citrin, M.D.   ANESTHESIA:  General endotracheal.   INDICATIONS:  Patricia Stein is a 65 year old female with a history of severe  back and left lower extremity pain failing all conservative management.  Workup has demonstrated evidence of very severe degenerative disk disease  with complete disk space collapse at the L5-S1 level.  Discography produces  a concordant pain response and is negative at the L3-4 and L4-5 levels.  The  patient has been counseled as to her options. She has decided to proceed  with an L5-S1 decompression and fusion for hopeful improvement of her  symptoms.   OPERATIVE NOTE:  The patient was taken to the operating room and placed on  the operating room table in a supine position.  After an adequate level of  anesthesia achieved, the patient was placed prone on the Wilson frame,  appropriately padded.  The patient's lumbar region was prepped and draped  sterilely.  A 10 blade was used to make a linear skin incision overlying the  L5 and S1 levels.  This was carried down sharply in the midline.  A  subperiosteal dissection was then  performed, exposing the laminae and facet  joints at L4, L5, and S1, as well as the transverse processes of L5 and the  sacral alae bilaterally.  A deep self-retaining retractor was placed,  intraoperative x-ray was taken, and the level was confirmed.  Complete  laminectomy of L5 was then performed using Leksell rongeurs, Kerrison  rongeurs, and the high-speed drill.  Complete inferior facetectomies of L5  were performed bilaterally and complete superior facetectomies at S1 were  performed bilaterally.  All bone was cleaned and used in later autograft.  The ligamentum flavum was elevated and resected in piecemeal fashion.  The  underlying thecal sac and exiting L5 and S1 nerve roots were widely  decompressed.  Epidural venous plexus was coagulated and cut.  The disk  space was isolated and incised with a 15 blade in rectangular fashion.  A  wide disk space clean-out  was then achieved using pituitary rongeurs, upward-  angled pituitary rongeurs, and Epstein curettes.  This was accomplished  bilaterally.  The disk space was then dilated up to 8 mm and then the 8 mm  dilator was left in the patient's right side.  The thecal sac and nerve  roots were protected on the left.  The disk space was then cut with an 8 mm  box cutter and then cut with an 8 mm chisel.  Soft tissue was removed from  the interspace.  An 8 x 26 mm Tangent wedge was then impacted into place,  recessed approximately 2 mm from the posterior cortical margin.  The  distractor was removed from the patient's right side.  Thecal sac and nerve  roots were protected on the right.  The disk space was then once again cut  with the 8 mm box cutter and cut with an 8 mm chisel.  Soft tissue was  removed.  The disk space was further curetted.  Morcellized autograft was  packed in the interspace.  A second 8 x 26 mm Tangent wedge was then  impacted into place and recessed approximately 1 mm from the posterior  cortical margin.  The  pedicles at L5 and S1 were then isolated and confirmed  using surface landmarks and intraoperative fluoroscopy.  Superficial bone  overlying the pedicles was removed using the high-speed drill.  Each pedicle  was then probed using a pedicle awl.  Each pedicle awl track was found to be  solidly within bone.  Each pedicle awl track was then tapped with a 5.25 mm  screw tap.  Each screw tap hole was found to be solidly within bone.  Spiral  90 6.75 x 40 mm screws were placed bilaterally at L5 and 6.75 x 35 mm screws  were placed bilaterally at S1.  The transverse processes and sacral alae  were then decorticated using the high-speed drill.  Morcellized autograft  was packed posterolaterally.  A short segment of titanium rod was then  contoured and placed through the screw heads at L5 and S1.  Locking caps  were then placed on the screw heads.  The locking caps were then engaged in  a sequential fashion with the construct under compression.  Final images  revealed good position of bone grafts and hardware, proper operative level,  with normal alignment of the spine.  The wound was then irrigated and then  closed in typical fashion.  A medium Hemovac drain was left in the epidural  space.  There were no apparent complications.  The patient tolerated the  procedure well, and she returns to the recovery room postop.                                               Henry A. Pool, M.D.    HAP/MEDQ  D:  06/08/2003  T:  06/09/2003  Job:  [id number]

## 2011-05-04 ENCOUNTER — Other Ambulatory Visit: Payer: Self-pay | Admitting: Neurological Surgery

## 2011-05-04 DIAGNOSIS — M542 Cervicalgia: Secondary | ICD-10-CM

## 2011-05-04 DIAGNOSIS — M545 Low back pain: Secondary | ICD-10-CM

## 2011-05-07 ENCOUNTER — Other Ambulatory Visit: Payer: Self-pay | Admitting: Neurological Surgery

## 2011-05-07 DIAGNOSIS — M545 Low back pain: Secondary | ICD-10-CM

## 2011-05-07 DIAGNOSIS — M542 Cervicalgia: Secondary | ICD-10-CM

## 2011-05-08 ENCOUNTER — Ambulatory Visit
Admission: RE | Admit: 2011-05-08 | Discharge: 2011-05-08 | Disposition: A | Discharge status: Home / Self Care | Payer: Medicare Other | Source: Ambulatory Visit | Attending: Neurological Surgery | Admitting: Neurological Surgery

## 2011-05-08 DIAGNOSIS — M545 Low back pain, unspecified: Secondary | ICD-10-CM

## 2011-05-08 DIAGNOSIS — M542 Cervicalgia: Secondary | ICD-10-CM

## 2011-06-23 LAB — CBC
HCT: 38.6
MCV: 91.7
Platelets: 217
RBC: 4.21
WBC: 8.4

## 2011-06-23 LAB — BASIC METABOLIC PANEL
BUN: 7
CO2: 32
Chloride: 94 — ABNORMAL LOW
Potassium: 3 — ABNORMAL LOW

## 2011-06-23 LAB — DIFFERENTIAL
Eosinophils Absolute: 0.2
Eosinophils Relative: 2
Lymphs Abs: 2.9
Monocytes Relative: 6

## 2011-06-23 LAB — ABO/RH: ABO/RH(D): O POS

## 2011-07-14 LAB — URINALYSIS, ROUTINE W REFLEX MICROSCOPIC
Glucose, UA: NEGATIVE
Hgb urine dipstick: NEGATIVE
Ketones, ur: NEGATIVE
Protein, ur: NEGATIVE

## 2011-07-22 ENCOUNTER — Other Ambulatory Visit: Payer: Self-pay | Admitting: Neurosurgery

## 2011-07-22 DIAGNOSIS — M545 Low back pain, unspecified: Secondary | ICD-10-CM

## 2011-07-29 ENCOUNTER — Ambulatory Visit
Admission: RE | Admit: 2011-07-29 | Discharge: 2011-07-29 | Disposition: A | Discharge status: Home / Self Care | Payer: Medicare Other | Source: Ambulatory Visit | Attending: Neurosurgery | Admitting: Neurosurgery

## 2011-07-29 DIAGNOSIS — M545 Low back pain, unspecified: Secondary | ICD-10-CM

## 2011-09-25 ENCOUNTER — Encounter (HOSPITAL_COMMUNITY): Payer: Self-pay | Admitting: Pharmacy Technician

## 2011-10-05 ENCOUNTER — Encounter (HOSPITAL_COMMUNITY)
Admission: RE | Admit: 2011-10-05 | Discharge: 2011-10-05 | Disposition: A | Discharge status: Home / Self Care | Payer: Medicare Other | Source: Ambulatory Visit | Attending: Anesthesiology | Admitting: Anesthesiology

## 2011-10-05 ENCOUNTER — Other Ambulatory Visit: Payer: Self-pay

## 2011-10-05 ENCOUNTER — Encounter (HOSPITAL_COMMUNITY): Payer: Self-pay

## 2011-10-05 ENCOUNTER — Encounter (HOSPITAL_COMMUNITY)
Admission: RE | Admit: 2011-10-05 | Discharge: 2011-10-05 | Disposition: A | Discharge status: Home / Self Care | Payer: Medicare Other | Source: Ambulatory Visit | Attending: Neurosurgery | Admitting: Neurosurgery

## 2011-10-05 HISTORY — DX: Pure hypercholesterolemia, unspecified: E78.00

## 2011-10-05 HISTORY — DX: Chronic obstructive pulmonary disease, unspecified: J44.9

## 2011-10-05 HISTORY — DX: Shortness of breath: R06.02

## 2011-10-05 HISTORY — DX: Other complications of anesthesia, initial encounter: T88.59XA

## 2011-10-05 HISTORY — DX: Essential (primary) hypertension: I10

## 2011-10-05 HISTORY — DX: Cardiac murmur, unspecified: R01.1

## 2011-10-05 HISTORY — DX: Anxiety disorder, unspecified: F41.9

## 2011-10-05 HISTORY — DX: Adverse effect of unspecified anesthetic, initial encounter: T41.45XA

## 2011-10-05 HISTORY — DX: Sleep apnea, unspecified: G47.30

## 2011-10-05 HISTORY — DX: Hypothyroidism, unspecified: E03.9

## 2011-10-05 LAB — BASIC METABOLIC PANEL
GFR calc Af Amer: >90 mL/min (ref >90)
GFR calc non Af Amer: >90 mL/min (ref >90)
Glucose, Bld: 108 mg/dL — ABNORMAL HIGH (ref 70–99)
Potassium: 3.7 mEq/L (ref 3.5–5.1)
Sodium: 135 mEq/L (ref 135–145)

## 2011-10-05 LAB — TYPE AND SCREEN: ABO/RH(D): O POS

## 2011-10-05 LAB — SURGICAL PCR SCREEN: Staphylococcus aureus: NEGATIVE

## 2011-10-05 LAB — CBC
HCT: 44.5 % (ref 36.0–46.0)
Hemoglobin: 15.8 g/dL — ABNORMAL HIGH (ref 12.0–15.0)
MCV: 91.9 fL (ref 78.0–100.0)
Platelets: 137 10*3/uL — ABNORMAL LOW (ref 150–400)
RBC: 4.84 MIL/uL (ref 3.87–5.11)
WBC: 6.9 10*3/uL (ref 4.0–10.5)

## 2011-10-05 LAB — DIFFERENTIAL
Eosinophils Relative: 4 % (ref 0–5)
Lymphocytes Relative: 48 % — ABNORMAL HIGH (ref 12–46)
Lymphs Abs: 3.3 10*3/uL (ref 0.7–4.0)

## 2011-10-05 MED ORDER — CHLORHEXIDINE GLUCONATE 4 % EX LIQD: 1.0000 "application " | Freq: Once | CUTANEOUS | Status: DC

## 2011-10-05 NOTE — Progress Notes (Signed)
Spoke with Revonda Standard, [place] regarding pt's history and anesthesia history. Sleep study, echo and stress test from [facility] then [place] will review.

## 2011-10-05 NOTE — Pre-Procedure Instructions (Signed)
20 Patricia Stein  10/05/2011   Your procedure is scheduled on:  October 13, 2011  Report to [facility] at 0530 AM.  Call this number if you have problems the morning of surgery: [phone]   Remember:   Do not eat food:After Midnight.  May have clear liquids: up to 4 Hours before arrival.  Clear liquids include soda, tea, black coffee, apple or grape juice, broth.  Take these medicines the morning of surgery with A SIP OF WATER: Albuterol, Xanax, Norvasc, Levothyroxine, Morphine, Prilosec   Do not wear jewelry, make-up or nail polish.  Do not wear lotions, powders, or perfumes. You may wear deodorant.  Do not shave 48 hours prior to surgery.  Do not bring valuables to the hospital.  Contacts, dentures or bridgework may not be worn into surgery.  Leave suitcase in the car. After surgery it may be brought to your room.  For patients admitted to the hospital, checkout time is 11:00 AM the day of discharge.   Patients discharged the day of surgery will not be allowed to drive home.  Special Instructions: CHG Shower Use Special Wash: 1/2 bottle night before surgery and 1/2 bottle morning of surgery.   Please read over the following fact sheets that you were given: Pain Booklet, Coughing and Deep Breathing, Blood Transfusion Information and Surgical Site Infection Prevention

## 2011-10-06 ENCOUNTER — Encounter (HOSPITAL_COMMUNITY): Payer: Self-pay | Admitting: Vascular Surgery

## 2011-10-06 NOTE — Consult Note (Signed)
Anesthesia:  Patient is a 66 year old female scheduled for a L3-5 posterior lateral arthrodesis with pedicle screws on 10/13/11.  Her medical history includes smoking, DM2, hypothyroidism, COPD, OSA, anxiety, HTN, and hypercholesterolemia.    She also reported hypotension in PACU and welts from an undetermined source after her last surgery.  (The Anesthesia records from her last two procedures can be found in her old medical charts that are with her current chart for the Anesthesiologist to review on the day of surgery.)  Her Cardiologist is Dr. Antoine Poche.  He last saw her in August of 2011.  She had a stress test at [facility] on 04/24/10 that showed probably normal LV perfusion.  Global LV systolic function was normal, EF 66%, normal wall motion, small, fixed apical defect consistent with attenuation artifact.  Echo on that same day showed EF 60-65%, normal LV systolic function, trace MR.  Carotid duplex showed no right ICA stenosis and < 50% left ICA stenosis.   Her last cath was done in 2006 and showed: 1. Hemodynamics: Left ventricular pressure 119/8 mmHg, arterial pressure 119/70 mmHg.  2. Ventriculography: Ejection fraction 60%, no wall motion abnormalities.  3. Selective coronary angiography: The left main coronary artery was a large caliber vessel with no evidence of flow limiting disease. 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 The diagonal branch was free of flow limiting disease. The circumflex coronary  artery had a proximal 30% stenosis. The marginal branches were free of flow-limiting disease. Right coronary artery is a large caliber vessel with some narrowing of approximately 20 to 30% in the midsection of the vessel.  Her EKG from 10/05/11 showed NSR, poor anterior R wave progression, but it was not felt significantly changed from her EKG on 07/23/09.  CXR from 10/05/11 showed no active disease.  Stable central mild bronchitic changes.  Labs reviewed.  Her last sleep study is also on her chart.  Plan to proceed in no acute cardiopulmonary symptoms.

## 2011-10-12 MED ORDER — CEFAZOLIN SODIUM-DEXTROSE 2-3 GM-% IV SOLR
2.0000 g | INTRAVENOUS | Status: AC
  Administered 2011-10-13: 2 g via INTRAVENOUS
  Filled 2011-10-12: qty 50

## 2011-10-12 MED ORDER — DEXAMETHASONE SODIUM PHOSPHATE 10 MG/ML IJ SOLN
10.0000 mg | Freq: Once | INTRAMUSCULAR | Status: AC
  Administered 2011-10-13: 10 mg via INTRAVENOUS
  Filled 2011-10-12: qty 1

## 2011-10-13 ENCOUNTER — Encounter (HOSPITAL_COMMUNITY): Payer: Self-pay | Admitting: Vascular Surgery

## 2011-10-13 ENCOUNTER — Ambulatory Visit (HOSPITAL_COMMUNITY): Payer: Medicare Other | Admitting: Vascular Surgery

## 2011-10-13 ENCOUNTER — Encounter (HOSPITAL_COMMUNITY): Payer: Self-pay | Admitting: *Deleted

## 2011-10-13 ENCOUNTER — Ambulatory Visit (HOSPITAL_COMMUNITY): Payer: Medicare Other

## 2011-10-13 ENCOUNTER — Encounter (HOSPITAL_COMMUNITY): Payer: Self-pay | Admitting: Neurosurgery

## 2011-10-13 ENCOUNTER — Inpatient Hospital Stay (HOSPITAL_COMMUNITY)
Admission: RE | Admit: 2011-10-13 | Discharge: 2011-10-14 | DRG: 460 | Disposition: A | Discharge status: Home / Self Care | Payer: Medicare Other | Source: Ambulatory Visit | Attending: Neurosurgery | Admitting: Neurosurgery

## 2011-10-13 ENCOUNTER — Encounter (HOSPITAL_COMMUNITY)
Admission: RE | Disposition: A | Discharge status: Home / Self Care | Payer: Self-pay | Source: Ambulatory Visit | Attending: Neurosurgery

## 2011-10-13 DIAGNOSIS — G4730 Sleep apnea, unspecified: Secondary | ICD-10-CM | POA: Diagnosis present

## 2011-10-13 DIAGNOSIS — Z79899 Other long term (current) drug therapy: Secondary | ICD-10-CM

## 2011-10-13 DIAGNOSIS — X58XXXA Exposure to other specified factors, initial encounter: Secondary | ICD-10-CM | POA: Diagnosis present

## 2011-10-13 DIAGNOSIS — F411 Generalized anxiety disorder: Secondary | ICD-10-CM | POA: Diagnosis present

## 2011-10-13 DIAGNOSIS — Z23 Encounter for immunization: Secondary | ICD-10-CM

## 2011-10-13 DIAGNOSIS — S32009A Unspecified fracture of unspecified lumbar vertebra, initial encounter for closed fracture: Principal | ICD-10-CM | POA: Diagnosis present

## 2011-10-13 DIAGNOSIS — E039 Hypothyroidism, unspecified: Secondary | ICD-10-CM | POA: Diagnosis present

## 2011-10-13 DIAGNOSIS — E7800 Pure hypercholesterolemia, unspecified: Secondary | ICD-10-CM | POA: Diagnosis present

## 2011-10-13 DIAGNOSIS — F17200 Nicotine dependence, unspecified, uncomplicated: Secondary | ICD-10-CM | POA: Diagnosis present

## 2011-10-13 DIAGNOSIS — E119 Type 2 diabetes mellitus without complications: Secondary | ICD-10-CM | POA: Diagnosis present

## 2011-10-13 DIAGNOSIS — I10 Essential (primary) hypertension: Secondary | ICD-10-CM | POA: Diagnosis present

## 2011-10-13 DIAGNOSIS — J4489 Other specified chronic obstructive pulmonary disease: Secondary | ICD-10-CM | POA: Diagnosis present

## 2011-10-13 DIAGNOSIS — J449 Chronic obstructive pulmonary disease, unspecified: Secondary | ICD-10-CM | POA: Diagnosis present

## 2011-10-13 LAB — GLUCOSE, CAPILLARY: Glucose-Capillary: 223 mg/dL — ABNORMAL HIGH (ref 70–99)

## 2011-10-13 SURGERY — POSTERIOR LUMBAR FUSION 1 LEVEL
Anesthesia: General | Site: Back | Wound class: Clean

## 2011-10-13 MED ORDER — HYDROMORPHONE HCL PF 1 MG/ML IJ SOLN
0.5000 mg | INTRAMUSCULAR | Status: DC | PRN
  Administered 2011-10-13 (×2): 0.5 mg via INTRAVENOUS

## 2011-10-13 MED ORDER — LOSARTAN POTASSIUM-HCTZ 100-25 MG PO TABS: 1.0000 | ORAL_TABLET | Freq: Every day | ORAL | Status: DC

## 2011-10-13 MED ORDER — ACETAMINOPHEN 325 MG PO TABS: 650.0000 mg | ORAL_TABLET | ORAL | Status: DC | PRN

## 2011-10-13 MED ORDER — MORPHINE SULFATE CR 15 MG PO TB12
30.0000 mg | ORAL_TABLET | Freq: Three times a day (TID) | ORAL | Status: DC
  Administered 2011-10-13 – 2011-10-14 (×3): 30 mg via ORAL
  Filled 2011-10-13 (×2): qty 2

## 2011-10-13 MED ORDER — POTASSIUM CHLORIDE ER 10 MEQ PO TBCR
10.0000 meq | EXTENDED_RELEASE_TABLET | ORAL | Status: DC
  Administered 2011-10-14: 10 meq via ORAL
  Filled 2011-10-13 (×2): qty 1

## 2011-10-13 MED ORDER — METFORMIN HCL 500 MG PO TABS
500.0000 mg | ORAL_TABLET | Freq: Three times a day (TID) | ORAL | Status: DC
  Administered 2011-10-13 – 2011-10-14 (×3): 500 mg via ORAL
  Filled 2011-10-13 (×6): qty 1

## 2011-10-13 MED ORDER — SODIUM CHLORIDE 0.9 % IV SOLN
INTRAVENOUS | Status: AC
  Filled 2011-10-13: qty 500

## 2011-10-13 MED ORDER — SODIUM CHLORIDE 0.9 % IV SOLN: 250.0000 mL | INTRAVENOUS | Status: DC

## 2011-10-13 MED ORDER — HYDROMORPHONE HCL PF 1 MG/ML IJ SOLN
INTRAMUSCULAR | Status: AC
  Filled 2011-10-13: qty 1

## 2011-10-13 MED ORDER — LIDOCAINE HCL 1 % IJ SOLN: INTRAMUSCULAR | Status: DC | PRN

## 2011-10-13 MED ORDER — LACTATED RINGERS IV SOLN
INTRAVENOUS | Status: DC | PRN
  Administered 2011-10-13: 07:00:00 via INTRAVENOUS

## 2011-10-13 MED ORDER — ALBUTEROL SULFATE HFA 108 (90 BASE) MCG/ACT IN AERS
1.0000 | INHALATION_SPRAY | Freq: Four times a day (QID) | RESPIRATORY_TRACT | Status: DC | PRN
  Filled 2011-10-13: qty 6.7

## 2011-10-13 MED ORDER — SODIUM CHLORIDE 0.9 % IR SOLN
Status: DC | PRN
  Administered 2011-10-13: 07:00:00

## 2011-10-13 MED ORDER — HEMOSTATIC AGENTS (NO CHARGE) OPTIME
TOPICAL | Status: DC | PRN
  Administered 2011-10-13: 1 via TOPICAL

## 2011-10-13 MED ORDER — SODIUM CHLORIDE 0.9 % IJ SOLN: 3.0000 mL | INTRAMUSCULAR | Status: DC | PRN

## 2011-10-13 MED ORDER — NIACIN ER (ANTIHYPERLIPIDEMIC) 500 MG PO TBCR
1000.0000 mg | EXTENDED_RELEASE_TABLET | Freq: Two times a day (BID) | ORAL | Status: DC
Start: 2011-10-13 — End: 2011-10-14
  Administered 2011-10-13 – 2011-10-14 (×3): 1000 mg via ORAL
  Filled 2011-10-13 (×5): qty 2

## 2011-10-13 MED ORDER — DIAZEPAM 5 MG PO TABS: 5.0000 mg | ORAL_TABLET | Freq: Four times a day (QID) | ORAL | Status: DC | PRN

## 2011-10-13 MED ORDER — LIDOCAINE HCL 4 % MT SOLN
OROMUCOSAL | Status: DC | PRN
  Administered 2011-10-13: 4 mL via TOPICAL

## 2011-10-13 MED ORDER — THROMBIN 20000 UNITS EX KIT
PACK | CUTANEOUS | Status: DC | PRN
  Administered 2011-10-13: 20000 [IU] via TOPICAL

## 2011-10-13 MED ORDER — HYDROCHLOROTHIAZIDE 25 MG PO TABS
25.0000 mg | ORAL_TABLET | Freq: Every day | ORAL | Status: DC
  Filled 2011-10-13 (×2): qty 1

## 2011-10-13 MED ORDER — DOCUSATE SODIUM 100 MG PO CAPS
100.0000 mg | ORAL_CAPSULE | Freq: Two times a day (BID) | ORAL | Status: DC
  Administered 2011-10-13 – 2011-10-14 (×3): 100 mg via ORAL
  Filled 2011-10-13 (×2): qty 1

## 2011-10-13 MED ORDER — PNEUMOCOCCAL VAC POLYVALENT 25 MCG/0.5ML IJ INJ
0.5000 mL | INJECTION | INTRAMUSCULAR | Status: AC
  Administered 2011-10-14: 0.5 mL via INTRAMUSCULAR
  Filled 2011-10-13: qty 0.5

## 2011-10-13 MED ORDER — AMLODIPINE BESYLATE 5 MG PO TABS
5.0000 mg | ORAL_TABLET | Freq: Every day | ORAL | Status: DC
  Filled 2011-10-13 (×2): qty 1

## 2011-10-13 MED ORDER — SENNA 8.6 MG PO TABS
1.0000 | ORAL_TABLET | Freq: Two times a day (BID) | ORAL | Status: DC
  Administered 2011-10-13 – 2011-10-14 (×3): 8.6 mg via ORAL
  Filled 2011-10-13 (×4): qty 1

## 2011-10-13 MED ORDER — PANTOPRAZOLE SODIUM 40 MG PO TBEC
40.0000 mg | DELAYED_RELEASE_TABLET | Freq: Every day | ORAL | Status: DC
  Administered 2011-10-13 – 2011-10-14 (×2): 40 mg via ORAL
  Filled 2011-10-13 (×2): qty 1

## 2011-10-13 MED ORDER — PHENOL 1.4 % MT LIQD: 1.0000 | OROMUCOSAL | Status: DC | PRN

## 2011-10-13 MED ORDER — HYDROXYZINE HCL 50 MG PO TABS
50.0000 mg | ORAL_TABLET | ORAL | Status: DC | PRN
  Filled 2011-10-13: qty 1

## 2011-10-13 MED ORDER — INFLUENZA VIRUS VACC SPLIT PF IM SUSP
0.5000 mL | INTRAMUSCULAR | Status: AC
  Administered 2011-10-14: 0.5 mL via INTRAMUSCULAR
  Filled 2011-10-13: qty 0.5

## 2011-10-13 MED ORDER — OXYCODONE-ACETAMINOPHEN 5-325 MG PO TABS
1.0000 | ORAL_TABLET | ORAL | Status: DC | PRN
  Administered 2011-10-13 – 2011-10-14 (×4): 2 via ORAL
  Filled 2011-10-13 (×4): qty 2

## 2011-10-13 MED ORDER — HYDROCODONE-ACETAMINOPHEN 5-325 MG PO TABS: 1.0000 | ORAL_TABLET | ORAL | Status: DC | PRN

## 2011-10-13 MED ORDER — HYDROXYZINE HCL 50 MG/ML IM SOLN
50.0000 mg | INTRAMUSCULAR | Status: DC | PRN
  Filled 2011-10-13: qty 1

## 2011-10-13 MED ORDER — ROSUVASTATIN CALCIUM 10 MG PO TABS
10.0000 mg | ORAL_TABLET | Freq: Every day | ORAL | Status: DC
  Administered 2011-10-13: 10 mg via ORAL
  Filled 2011-10-13 (×2): qty 1

## 2011-10-13 MED ORDER — MORPHINE SULFATE CR 15 MG PO TB12: 30.0000 mg | ORAL_TABLET | Freq: Three times a day (TID) | ORAL | Status: DC

## 2011-10-13 MED ORDER — FUROSEMIDE 40 MG PO TABS
40.0000 mg | ORAL_TABLET | ORAL | Status: DC
  Administered 2011-10-14: 40 mg via ORAL
  Filled 2011-10-13 (×2): qty 1

## 2011-10-13 MED ORDER — MORPHINE SULFATE CR 15 MG PO TB12
15.0000 mg | ORAL_TABLET | Freq: Three times a day (TID) | ORAL | Status: DC
  Administered 2011-10-13 – 2011-10-14 (×3): 15 mg via ORAL
  Filled 2011-10-13: qty 3
  Filled 2011-10-13 (×2): qty 1

## 2011-10-13 MED ORDER — SODIUM CHLORIDE 0.9 % IJ SOLN
3.0000 mL | Freq: Two times a day (BID) | INTRAMUSCULAR | Status: DC
  Administered 2011-10-13: 3 mL via INTRAVENOUS

## 2011-10-13 MED ORDER — IBUPROFEN 200 MG PO TABS
200.0000 mg | ORAL_TABLET | Freq: Two times a day (BID) | ORAL | Status: DC
  Administered 2011-10-13 – 2011-10-14 (×2): 200 mg via ORAL
  Filled 2011-10-13 (×4): qty 1

## 2011-10-13 MED ORDER — DULOXETINE HCL 60 MG PO CPEP
60.0000 mg | ORAL_CAPSULE | Freq: Every day | ORAL | Status: DC
  Administered 2011-10-13 – 2011-10-14 (×2): 60 mg via ORAL
  Filled 2011-10-13 (×2): qty 1

## 2011-10-13 MED ORDER — PROPOFOL 10 MG/ML IV EMUL
INTRAVENOUS | Status: DC | PRN
  Administered 2011-10-13: 120 mg via INTRAVENOUS

## 2011-10-13 MED ORDER — ALPRAZOLAM 0.5 MG PO TABS
0.5000 mg | ORAL_TABLET | Freq: Two times a day (BID) | ORAL | Status: DC | PRN
  Administered 2011-10-13: 0.5 mg via ORAL
  Filled 2011-10-13: qty 1

## 2011-10-13 MED ORDER — CEFAZOLIN SODIUM 1-5 GM-% IV SOLN
1.0000 g | Freq: Three times a day (TID) | INTRAVENOUS | Status: AC
  Administered 2011-10-13 (×2): 1 g via INTRAVENOUS
  Filled 2011-10-13 (×3): qty 50

## 2011-10-13 MED ORDER — ROCURONIUM BROMIDE 100 MG/10ML IV SOLN
INTRAVENOUS | Status: DC | PRN
  Administered 2011-10-13: 50 mg via INTRAVENOUS

## 2011-10-13 MED ORDER — HYDROMORPHONE HCL PF 1 MG/ML IJ SOLN
0.2500 mg | INTRAMUSCULAR | Status: DC | PRN
  Administered 2011-10-13 (×4): 0.5 mg via INTRAVENOUS

## 2011-10-13 MED ORDER — MIDAZOLAM HCL 5 MG/5ML IJ SOLN
INTRAMUSCULAR | Status: DC | PRN
  Administered 2011-10-13: 2 mg via INTRAVENOUS

## 2011-10-13 MED ORDER — BACITRACIN 50000 UNITS IM SOLR
INTRAMUSCULAR | Status: AC
  Filled 2011-10-13: qty 1

## 2011-10-13 MED ORDER — LEVOTHYROXINE SODIUM 137 MCG PO TABS
137.0000 ug | ORAL_TABLET | ORAL | Status: DC
  Administered 2011-10-14: 137 ug via ORAL
  Filled 2011-10-13 (×2): qty 1

## 2011-10-13 MED ORDER — LOSARTAN POTASSIUM 50 MG PO TABS
100.0000 mg | ORAL_TABLET | Freq: Every day | ORAL | Status: DC
  Filled 2011-10-13 (×2): qty 2

## 2011-10-13 MED ORDER — ONDANSETRON HCL 4 MG/2ML IJ SOLN: 4.0000 mg | Freq: Once | INTRAMUSCULAR | Status: DC | PRN

## 2011-10-13 MED ORDER — ONDANSETRON HCL 4 MG/2ML IJ SOLN
INTRAMUSCULAR | Status: DC | PRN
  Administered 2011-10-13: 4 mg via INTRAVENOUS

## 2011-10-13 MED ORDER — 0.9 % SODIUM CHLORIDE (POUR BTL) OPTIME
TOPICAL | Status: DC | PRN
  Administered 2011-10-13: 1000 mL

## 2011-10-13 MED ORDER — NEOSTIGMINE METHYLSULFATE 1 MG/ML IJ SOLN
INTRAMUSCULAR | Status: DC | PRN
  Administered 2011-10-13: 4 mg via INTRAVENOUS

## 2011-10-13 MED ORDER — PREGABALIN 75 MG PO CAPS
150.0000 mg | ORAL_CAPSULE | Freq: Two times a day (BID) | ORAL | Status: DC
Start: 2011-10-13 — End: 2011-10-14
  Administered 2011-10-13 – 2011-10-14 (×3): 150 mg via ORAL
  Filled 2011-10-13: qty 1
  Filled 2011-10-13: qty 2
  Filled 2011-10-13 (×2): qty 1

## 2011-10-13 MED ORDER — FENTANYL CITRATE 0.05 MG/ML IJ SOLN
INTRAMUSCULAR | Status: DC | PRN
  Administered 2011-10-13 (×2): 50 ug via INTRAVENOUS
  Administered 2011-10-13: 100 ug via INTRAVENOUS

## 2011-10-13 MED ORDER — BUPIVACAINE HCL (PF) 0.25 % IJ SOLN
INTRAMUSCULAR | Status: DC | PRN
  Administered 2011-10-13: 20 mL

## 2011-10-13 MED ORDER — EPHEDRINE SULFATE 50 MG/ML IJ SOLN
INTRAMUSCULAR | Status: DC | PRN
  Administered 2011-10-13 (×3): 5 mg via INTRAVENOUS
  Administered 2011-10-13: 10 mg via INTRAVENOUS
  Administered 2011-10-13: 5 mg via INTRAVENOUS

## 2011-10-13 MED ORDER — HETASTARCH-ELECTROLYTES 6 % IV SOLN
INTRAVENOUS | Status: DC | PRN
  Administered 2011-10-13: 09:00:00 via INTRAVENOUS

## 2011-10-13 MED ORDER — GLYCOPYRROLATE 0.2 MG/ML IJ SOLN
INTRAMUSCULAR | Status: DC | PRN
  Administered 2011-10-13: .6 mg via INTRAVENOUS

## 2011-10-13 MED ORDER — HYDROMORPHONE HCL PF 1 MG/ML IJ SOLN
INTRAMUSCULAR | Status: AC
  Administered 2011-10-13: 0.5 mg via INTRAVENOUS
  Filled 2011-10-13: qty 1

## 2011-10-13 MED ORDER — LIDOCAINE HCL (CARDIAC) 20 MG/ML IV SOLN
INTRAVENOUS | Status: DC | PRN
  Administered 2011-10-13: 20 mg via INTRAVENOUS

## 2011-10-13 MED ORDER — ACETAMINOPHEN 650 MG RE SUPP: 650.0000 mg | RECTAL | Status: DC | PRN

## 2011-10-13 MED ORDER — MENTHOL 3 MG MT LOZG: 1.0000 | LOZENGE | OROMUCOSAL | Status: DC | PRN

## 2011-10-13 MED ORDER — HYDROMORPHONE HCL PF 1 MG/ML IJ SOLN: 0.5000 mg | INTRAMUSCULAR | Status: DC | PRN

## 2011-10-13 MED ORDER — METOPROLOL SUCCINATE ER 100 MG PO TB24
100.0000 mg | ORAL_TABLET | Freq: Two times a day (BID) | ORAL | Status: DC
  Filled 2011-10-13 (×4): qty 1

## 2011-10-13 SURGICAL SUPPLY — 66 items
BAG DECANTER FOR FLEXI CONT (MISCELLANEOUS) ×2 IMPLANT
BENZOIN TINCTURE PRP APPL 2/3 (GAUZE/BANDAGES/DRESSINGS) ×2 IMPLANT
BLADE SURG ROTATE 9660 (MISCELLANEOUS) IMPLANT
BRUSH SCRUB EZ PLAIN DRY (MISCELLANEOUS) ×2 IMPLANT
BUR MATCHSTICK NEURO 3.0 LAGG (BURR) ×2 IMPLANT
CANISTER SUCTION 2500CC (MISCELLANEOUS) ×2 IMPLANT
CAP LCK SPNE (Orthopedic Implant) ×6 IMPLANT
CAP LOCK SPINE RADIUS (Orthopedic Implant) ×6 IMPLANT
CAP LOCKING (Orthopedic Implant) ×6 IMPLANT
CLOTH BEACON ORANGE TIMEOUT ST (SAFETY) ×2 IMPLANT
CONT SPEC 4OZ CLIKSEAL STRL BL (MISCELLANEOUS) ×4 IMPLANT
COVER BACK TABLE 24X17X13 BIG (DRAPES) IMPLANT
COVER TABLE BACK 60X90 (DRAPES) ×2 IMPLANT
CROSSLINK MEDIUM (Orthopedic Implant) ×2 IMPLANT
DECANTER SPIKE VIAL GLASS SM (MISCELLANEOUS) ×2 IMPLANT
DERMABOND ADVANCED (GAUZE/BANDAGES/DRESSINGS) ×1
DERMABOND ADVANCED .7 DNX12 (GAUZE/BANDAGES/DRESSINGS) ×1 IMPLANT
DRAPE C-ARM 42X72 X-RAY (DRAPES) ×4 IMPLANT
DRAPE LAPAROTOMY 100X72X124 (DRAPES) ×2 IMPLANT
DRAPE POUCH INSTRU U-SHP 10X18 (DRAPES) ×2 IMPLANT
DRAPE PROXIMA HALF (DRAPES) IMPLANT
DRAPE SURG 17X23 STRL (DRAPES) ×8 IMPLANT
ELECT REM PT RETURN 9FT ADLT (ELECTROSURGICAL) ×2
ELECTRODE REM PT RTRN 9FT ADLT (ELECTROSURGICAL) ×1 IMPLANT
EVACUATOR 1/8 PVC DRAIN (DRAIN) ×2 IMPLANT
GAUZE SPONGE 4X4 16PLY XRAY LF (GAUZE/BANDAGES/DRESSINGS) IMPLANT
GLOVE BIOGEL PI IND STRL 6.5 (GLOVE) ×1 IMPLANT
GLOVE BIOGEL PI IND STRL 7.5 (GLOVE) ×1 IMPLANT
GLOVE BIOGEL PI INDICATOR 6.5 (GLOVE) ×1
GLOVE BIOGEL PI INDICATOR 7.5 (GLOVE) ×1
GLOVE ECLIPSE 7.5 STRL STRAW (GLOVE) ×2 IMPLANT
GLOVE ECLIPSE 8.5 STRL (GLOVE) ×4 IMPLANT
GLOVE EXAM NITRILE LRG STRL (GLOVE) IMPLANT
GLOVE EXAM NITRILE MD LF STRL (GLOVE) ×4 IMPLANT
GLOVE EXAM NITRILE XL STR (GLOVE) IMPLANT
GLOVE EXAM NITRILE XS STR PU (GLOVE) IMPLANT
GLOVE INDICATOR 7.0 STRL GRN (GLOVE) ×2 IMPLANT
GLOVE SS BIOGEL STRL SZ 6.5 (GLOVE) ×1 IMPLANT
GLOVE SUPERSENSE BIOGEL SZ 6.5 (GLOVE) ×1
GOWN BRE IMP SLV AUR LG STRL (GOWN DISPOSABLE) ×4 IMPLANT
GOWN BRE IMP SLV AUR XL STRL (GOWN DISPOSABLE) ×6 IMPLANT
GOWN STRL REIN 2XL LVL4 (GOWN DISPOSABLE) IMPLANT
GRAFT BN 10X1XDBM MAGNIFUSE (Bone Implant) ×1 IMPLANT
GRAFT BONE MAGNIFUSE 1X10CM (Bone Implant) ×1 IMPLANT
KIT BASIN OR (CUSTOM PROCEDURE TRAY) ×2 IMPLANT
KIT INFUSE X SMALL 1.4CC (Orthopedic Implant) ×2 IMPLANT
KIT ROOM TURNOVER OR (KITS) ×2 IMPLANT
MILL MEDIUM DISP (BLADE) ×2 IMPLANT
NEEDLE HYPO 22GX1.5 SAFETY (NEEDLE) ×2 IMPLANT
NS IRRIG 1000ML POUR BTL (IV SOLUTION) ×2 IMPLANT
PACK LAMINECTOMY NEURO (CUSTOM PROCEDURE TRAY) ×2 IMPLANT
ROD 70MM (Rod) ×2 IMPLANT
ROD SPNL 70X5.5XNS TI RDS (Rod) ×2 IMPLANT
SCREW 6.75X40MM (Screw) ×4 IMPLANT
SPONGE GAUZE 4X4 12PLY (GAUZE/BANDAGES/DRESSINGS) ×2 IMPLANT
SPONGE SURGIFOAM ABS GEL 100 (HEMOSTASIS) ×4 IMPLANT
STRIP CLOSURE SKIN 1/2X4 (GAUZE/BANDAGES/DRESSINGS) ×2 IMPLANT
SUT VIC AB 0 CT1 18XCR BRD8 (SUTURE) ×2 IMPLANT
SUT VIC AB 0 CT1 8-18 (SUTURE) ×2
SUT VIC AB 2-0 CT1 18 (SUTURE) ×2 IMPLANT
SUT VIC AB 3-0 SH 8-18 (SUTURE) ×6 IMPLANT
SYR 20ML ECCENTRIC (SYRINGE) ×2 IMPLANT
TOWEL OR 17X24 6PK STRL BLUE (TOWEL DISPOSABLE) ×2 IMPLANT
TOWEL OR 17X26 10 PK STRL BLUE (TOWEL DISPOSABLE) ×2 IMPLANT
TRAY FOLEY CATH 14FRSI W/METER (CATHETERS) ×2 IMPLANT
WATER STERILE IRR 1000ML POUR (IV SOLUTION) ×2 IMPLANT

## 2011-10-13 NOTE — Transfer of Care (Signed)
Immediate Anesthesia Transfer of Care Note  Patient: Patricia Stein  Procedure(s) Performed:  POSTERIOR LUMBAR FUSION 1 LEVEL - Lumbar three--five posterior lumbar arthrodesis WITH PEDICLE SCREWS  Patient Location: PACU  Anesthesia Type: General  Level of Consciousness: awake, alert  and oriented  Airway & Oxygen Therapy: Patient Spontanous Breathing and Patient connected to nasal cannula oxygen  Post-op Assessment: Report given to PACU RN, Post -op Vital signs reviewed and stable, Patient moving all extremities and Patient able to stick tongue midline  Post vital signs: Reviewed and stable Filed Vitals:   10/13/11 0627  BP: 99/66  Pulse: 58  Temp: 36.6 C  Resp: 16    Complications: No apparent anesthesia complications

## 2011-10-13 NOTE — Anesthesia Procedure Notes (Signed)
Procedure Name: Intubation Date/Time: 10/13/2011 7:53 AM Performed by: Elizbeth Squires Pre-anesthesia Checklist: Patient identified, Emergency Drugs available, Suction available and Patient being monitored Patient Re-evaluated:Patient Re-evaluated prior to inductionOxygen Delivery Method: Circle System Utilized Preoxygenation: Pre-oxygenation with 100% oxygen Intubation Type: IV induction Ventilation: Mask ventilation without difficulty and Oral airway inserted - appropriate to patient size Laryngoscope Size: Mac and 3 Grade View: Grade I Tube type: Oral Number of attempts: 1 Airway Equipment and Method: stylet Placement Confirmation: ETT inserted through vocal cords under direct vision,  positive ETCO2 and breath sounds checked- equal and bilateral Secured at: 19 cm Tube secured with: Tape Dental Injury: Teeth and Oropharynx as per pre-operative assessment

## 2011-10-13 NOTE — Anesthesia Postprocedure Evaluation (Signed)
  Anesthesia Post-op Note  Patient: Patricia Stein  Procedure(s) Performed:  POSTERIOR LUMBAR FUSION 1 LEVEL - Lumbar three--five posterior lumbar arthrodesis WITH PEDICLE SCREWS  Patient Location: PACU  Anesthesia Type: General  Level of Consciousness: awake, alert , oriented and patient cooperative  Airway and Oxygen Therapy: Patient Spontanous Breathing and Patient connected to nasal cannula oxygen  Post-op Pain: mild  Post-op Assessment: Post-op Vital signs reviewed, Patient's Cardiovascular Status Stable, Respiratory Function Stable, Patent Airway, No signs of Nausea or vomiting and Pain level controlled  Post-op Vital Signs: stable  Complications: No apparent anesthesia complications

## 2011-10-13 NOTE — Brief Op Note (Signed)
10/13/2011  9:39 AM  PATIENT:  Patricia Stein  66 y.o. female  PRE-OPERATIVE DIAGNOSIS:  FRACTURE  POST-OPERATIVE DIAGNOSIS:  FRACTURE  PROCEDURE:  Procedure(s): POSTERIOR LUMBAR FUSION 1 LEVEL  SURGEON:  Surgeon(s): Sherilyn Cooter A Rayner Erman  PHYSICIAN ASSISTANT:   ASSISTANTS: Kritzer   ANESTHESIA:   general  EBL:  Total I/O In: 1700 [I.V.:1200; IV Piggyback:500] Out: 450 [Urine:200; Blood:250]  BLOOD ADMINISTERED:none  DRAINS: (Medium) Hemovact drain(s) in the Epidural space with  Suction Open   LOCAL MEDICATIONS USED:  MARCAINE 20CC  SPECIMEN:  No Specimen  DISPOSITION OF SPECIMEN:  N/A  COUNTS:  YES  TOURNIQUET:  * No tourniquets in log *  DICTATION: .Dragon Dictation  PLAN OF CARE: Admit to inpatient   PATIENT DISPOSITION:  PACU - hemodynamically stable.   Delay start of Pharmacological VTE agent (>24hrs) due to surgical blood loss or risk of bleeding:  Yes

## 2011-10-13 NOTE — Op Note (Signed)
Date of procedure: 10/13/2011 F2  Date of dictation: Same  Service: Neurosurgery  Preoperative diagnosis: L4-5 fracture with instability.  Postoperative diagnosis: Same  Procedure Name: Reexploration of L L3-4 posterior lateral fusion with removal of instrumentation.  L3-4-5 posterior lateral arthrodesis utilizing segmental pedicle screw instrumentation local autographing, morselized allograft, and bone morphogenic protein.  Surgeon:Merranda Bolls A.Aaima Gaddie, M.D.  Asst. Surgeon: Gerlene Fee  Anesthesia: General  Indication: Patient is a 66 year old female status post previous L3-4 L4-5 and L5-S1 fusion remotely in the past. Patient was known to have solid arthrodesis at L4-5 and L5-S1 and subsequently underwent a L3 for decompression and fusion which was uncomplicated. Patient recently suffered an injury with a new fracture of her L4-5 fusion extending into the vertebral body of L4. She has failed conservative management and presents now for operative stabilization and fusion for hopeful improvement of her symptoms.  Operative note: Patient is taken to the operating room and placed on the operative table in the supine position. After an adequate level of anesthesia is achieved patient positioned prone onto a Wilson frame and appropriate padded. Patient's lumbar region was prepped and draped sterilely. 10 blade used to make a linear skin incision overlying the L4-5 interspace. This carried down sharply in the midline. Supper off Henderson Newcomer performed exposing the lamina of L2 and then working out laterally later exposing the pedicle screw sedation and lateral fusion mass at L3-4. Dissection of the lateral facet complex and transverse processes of L4 and L5 were then made. Deep self-retaining retractors placed intraoperative fluoroscopy is used and the levels were confirmed. Previously placed pedicle screw station at L3-4 was disassembled and the rods were removed. Pedicle screws at L3 and L4 we will position and  solid bilaterally. Pedicles of L5 were identified using surface landmarks and intraoperative fluoroscopy. Superficial bone overlying the pedicle of L5 presented it removed using the high-speed drill. Each pedicle was then probed using a pedicle awl. Each pedicle awl track was then tapped with a 5.2 fiber screw tap. Her temple was then probed and found to be solidly within bone. 6.75 x 40 mm radius screws placed bilaterally at L5. Transverse processes of L3-4 and 5 were decorticating high-speed drill. Morselized autograft and a bone morphogenic-soaked sponge was placed posterolaterally for later fusion. On top of this a package of magna graft was packed posterolaterally for additional graft material. Wound is then irrigated with and bike solution. Short same day short segment titanium rod was then placed through the screw heads at L3-4 and 5. Locking caps and placed over the screw heads. Transverse connector was placed and the construct was fully tightened down. Final images revealed good position the bone graft and hardware proper upper level with normal lamina spine. Wound is irrigated one final time and closed in a typical fashion. Steri-Strips triggers were applied. There were no apparent complications. Patient tolerated suture well and returns to the recovery room postop.

## 2011-10-13 NOTE — Anesthesia Preprocedure Evaluation (Addendum)
Anesthesia Evaluation  Patient identified by MRN, date of birth, ID band Patient awake, Patient confused and Patient unresponsive    Reviewed: Allergy & Precautions, H&P , NPO status , Patient's Chart, lab work & pertinent test results, reviewed documented beta blocker date and time   History of Anesthesia Complications (+) AWARENESS UNDER ANESTHESIA  Airway Mallampati: II TM Distance: >3 FB Neck ROM: Full    Dental  (+) Edentulous Upper, Partial Lower and Dental Advisory Given   Pulmonary shortness of breath and with exertion, sleep apnea and Continuous Positive Airway Pressure Ventilation , COPD COPD inhaler, Current Smoker,          Cardiovascular Exercise Tolerance: Poor hypertension, Pt. on medications and Pt. on home beta blockers + Valvular Problems/Murmurs regular Normal    Neuro/Psych Anxiety Negative Neurological ROS     GI/Hepatic negative GI ROS, Neg liver ROS,   Endo/Other  Diabetes mellitus-, Well Controlled, Type 2, Oral Hypoglycemic AgentsHypothyroidism   Renal/GU   Genitourinary negative   Musculoskeletal   Abdominal   Peds  Hematology negative hematology ROS (+)   Anesthesia Other Findings   Reproductive/Obstetrics                          Anesthesia Physical Anesthesia Plan  ASA: III  Anesthesia Plan: General   Post-op Pain Management:    Induction: Intravenous  Airway Management Planned: Oral ETT  Additional Equipment:   Intra-op Plan:   Post-operative Plan: Extubation in OR  Informed Consent: I have reviewed the patients History and Physical, chart, labs and discussed the procedure including the risks, benefits and alternatives for the proposed anesthesia with the patient or authorized representative who has indicated his/her understanding and acceptance.   Dental advisory given  Plan Discussed with: CRNA, Anesthesiologist and Surgeon  Anesthesia Plan  Comments:        Anesthesia Quick Evaluation

## 2011-10-13 NOTE — Preoperative (Signed)
Beta Blockers   Reason not to administer Beta Blockers:Not Applicable 

## 2011-10-13 NOTE — H&P (Signed)
Patricia Stein is an 66 y.o. female.   Chief Complaint: Back pain HPI: 66 year old female status post previous L3-4 L4-5 and L5-S1 fusion. The patient has had previous posterior hardware removed at L4-S1. She had been doing reasonably well when she suffered acute event leading to worsening back pain. Workup demonstrated evidence of an acute fracture through her fusion mass at L4-5 with new evidence of instability. There is no evidence of radiculopathy. There's no evidence of any new spinal stenosis. Patient's failed conservative management including bracing. We discussed options for management operative and not care. Patient presents now for lumbar fusion surgery at L4-5.  Past Medical History  Diagnosis Date  . Diabetes mellitus   . Hypothyroidism   . COPD (chronic obstructive pulmonary disease)   . Heart murmur     small per pt see [facility] cardiolgist  . Shortness of breath   . Sleep apnea     uses CPAP with oxygen sleep study done ~ 2 years ago in [place] at [facility]  . Anxiety   . Hypertension     stress test done in 2011, last seen heart MD in 2011  . Hypercholesteremia     pt reports that her levels are really high  . Complication of anesthesia     pt reports with last surgery they had trouble with her BP and getting it up she went to recovery room with welps on her unsure  what cause reaction    Past Surgical History  Procedure Date  . Abdominal hysterectomy   . Carpal tunnel release   . Tonsillectomy   . Tubal ligation   . Back surgery     x 4    Family History  Problem Relation Age of Onset  . Anesthesia problems Neg Hx    Social History:  reports that she has been smoking.  She has never used smokeless tobacco. She reports that she does not drink alcohol or use illicit drugs.  Allergies: No Known Allergies  Medications Prior to Admission  Medication Dose Route Frequency Provider Last Rate Last Dose  . bacitracin 66440 UNITS injection           .  ceFAZolin (ANCEF) IVPB 2 g/50 mL premix  2 g Intravenous 60 min Pre-Op Knute Mazzuca A Jolayne Branson      . dexamethasone (DECADRON) injection 10 mg  10 mg Intravenous Once [employer]      . HYDROmorphone (DILAUDID) injection 0.25-0.5 mg  0.25-0.5 mg Intravenous Q5 min PRN Rivka Barbara, MD      . ondansetron [facility]) injection 4 mg  4 mg Intravenous Once PRN Rivka Barbara, MD      . sodium chloride 0.9 % infusion            No current outpatient prescriptions on file as of 10/13/2011.    Results for orders placed during the hospital encounter of 10/13/11 (from the past 48 hour(s))  GLUCOSE, CAPILLARY     Status: Normal   Collection Time   10/13/11  6:29 AM      Component Value Range Comment   Glucose-Capillary 99  70 - 99 (mg/dL)    Comment 1 Notify RN      No results found.  Review of Systems  All other systems reviewed and are negative.    Blood pressure 99/66, pulse 58, temperature 97.9 F (36.6 C), temperature source Oral, resp. rate 16, SpO2 93.00%. Physical Exam  Nursing note and vitals reviewed. Constitutional: She is oriented  to person, place, and time. She appears well-developed and well-nourished. No distress.  HENT:  Head: Normocephalic and atraumatic.  Right Ear: External ear normal.  Left Ear: External ear normal.  Eyes: Conjunctivae and EOM are normal. Pupils are equal, round, and reactive to light.  Neck: Normal range of motion. Neck supple. No tracheal deviation present. No thyromegaly present.  Cardiovascular: Normal rate, regular rhythm and normal heart sounds.   Respiratory: Effort normal and breath sounds normal. No respiratory distress. She has no wheezes.  GI: Soft. Bowel sounds are normal. She exhibits no distension. There is no tenderness.  Musculoskeletal: Normal range of motion. She exhibits no edema and no tenderness.  Neurological: She is alert and oriented to person, place, and time. She has normal reflexes. She displays normal reflexes. No cranial  nerve deficit. She exhibits normal muscle tone. Coordination normal.  Skin: Skin is warm and dry. No rash noted. She is not diaphoretic. No erythema. No pallor.  Psychiatric: She has a normal mood and affect. Her behavior is normal. Judgment and thought content normal.     Assessment/Plan L4-5 fracture. Plan reexploration of previous L3-4 fusion with L3-L5 posterior lateral arthrodesis utilizing segmental pedicle screw instrumentation. Risks and benefits have been explained patient wishes to proceed.  Amontae Ng A 10/13/2011, 7:37 AM

## 2011-10-14 LAB — GLUCOSE, CAPILLARY: Glucose-Capillary: 135 mg/dL — ABNORMAL HIGH (ref 70–99)

## 2011-10-14 MED ORDER — OXYCODONE-ACETAMINOPHEN 5-325 MG PO TABS: 1.0000 | ORAL_TABLET | ORAL | Status: AC | PRN

## 2011-10-14 MED ORDER — MORPHINE SULFATE CR 30 MG PO TB12: 30.0000 mg | ORAL_TABLET | Freq: Three times a day (TID) | ORAL | Status: AC

## 2011-10-14 MED ORDER — DIAZEPAM 5 MG PO TABS: 5.0000 mg | ORAL_TABLET | Freq: Four times a day (QID) | ORAL | Status: AC | PRN

## 2011-10-14 MED ORDER — MORPHINE SULFATE CR 15 MG PO TB12: 15.0000 mg | ORAL_TABLET | Freq: Three times a day (TID) | ORAL | Status: AC

## 2011-10-14 NOTE — Progress Notes (Signed)
Utilization review completed. Jhanvi Drakeford, RN, BSN. 10/14/11 

## 2011-10-14 NOTE — Progress Notes (Signed)
Patient D/C instructions reviewed and patient had no further questions. Pt. States pain decreased and pt in no signs of acute distress. Pt D/C home.

## 2011-10-14 NOTE — Discharge Summary (Signed)
Physician Discharge Summary  Patient ID: CING [place] MRN: [id number] DOB/AGE: Feb 22, 1946 66 y.o.  Admit date: 10/13/2011 Discharge date: 10/14/2011  Admission Diagnoses:  Discharge Diagnoses:  Principal Problem:  *Lumbar vertebral fracture   Discharged Condition: good  Hospital Course: Patient mid-to the hospital where she underwent uncomplicated lumbar fusion at L4-5 for treatment of her symptomatic L4-5 fracture. Postoperatively she is done very well with resolution of her preoperative back pain. She is having no lower chamois symptoms and definitely feels improved relative to her preoperative state. Patient is tolerating regular diet she's angulating well and is ready for discharge home. S2   Discharge Exam: Blood pressure 111/66, pulse 69, temperature 99 F (37.2 C), temperature source Oral, resp. rate 17, SpO2 97.00%. Awake and alert oriented appropriate. Cranial nerve function is intact. Motor and sensory function extremities is normal except for some mild decreased sensation to pinprick and light touch her left L3 dermatome which is chronic. Her wound is healing well. Chest and abdominal exam is benign.  Disposition:    Medication List  As of 10/14/2011  9:07 AM   TAKE these medications         albuterol 108 (90 BASE) MCG/ACT inhaler   Commonly known as: PROVENTIL HFA;VENTOLIN HFA   Inhale 1 puff into the lungs every 6 (six) hours as needed. FOR WHEEZING      ALPRAZolam 0.5 MG tablet   Commonly known as: XANAX   Take 0.5 mg by mouth 2 (two) times daily as needed. FOR ANXIETY      amLODipine 5 MG tablet   Commonly known as: NORVASC   Take 5 mg by mouth daily.      diazepam 5 MG tablet   Commonly known as: VALIUM   Take 1-2 tablets (5-10 mg total) by mouth every 6 (six) hours as needed (spasms/pain).      DULoxetine 60 MG capsule   Commonly known as: CYMBALTA   Take 60 mg by mouth daily.      furosemide 40 MG tablet   Commonly known as: LASIX   Take 40 mg by  mouth every morning.      ibuprofen 200 MG tablet   Commonly known as: ADVIL,MOTRIN   Take 200 mg by mouth 2 (two) times daily.      levothyroxine 137 MCG tablet   Commonly known as: SYNTHROID, LEVOTHROID   Take 137 mcg by mouth every morning.      losartan-hydrochlorothiazide 100-25 MG per tablet   Commonly known as: HYZAAR   Take 1 tablet by mouth daily.      metFORMIN 500 MG tablet   Commonly known as: GLUCOPHAGE   Take 500 mg by mouth 3 (three) times daily after meals.      metoprolol succinate 100 MG 24 hr tablet   Commonly known as: TOPROL-XL   Take 100 mg by mouth 2 (two) times daily.      morphine 15 MG 12 hr tablet   Commonly known as: MS CONTIN   Take 15 mg by mouth 3 (three) times daily.      morphine 30 MG 12 hr tablet   Commonly known as: MS CONTIN   Take 30 mg by mouth 3 (three) times daily.      morphine 15 MG 12 hr tablet   Commonly known as: MS CONTIN   Take 1 tablet (15 mg total) by mouth every 8 (eight) hours.      morphine 30 MG 12 hr tablet   Commonly  known as: MS CONTIN   Take 1 tablet (30 mg total) by mouth every 8 (eight) hours.      niacin 1000 MG CR tablet   Commonly known as: NIASPAN   Take 1,000 mg by mouth 2 (two) times daily.      omeprazole 20 MG capsule   Commonly known as: PRILOSEC   Take 20 mg by mouth 2 (two) times daily.      oxyCODONE-acetaminophen 5-325 MG per tablet   Commonly known as: PERCOCET   Take 1-2 tablets by mouth every 4 (four) hours as needed for pain.      potassium chloride 10 MEQ tablet   Commonly known as: K-DUR   Take 10 mEq by mouth every morning.      pregabalin 150 MG capsule   Commonly known as: LYRICA   Take 150 mg by mouth 2 (two) times daily.      rosuvastatin 10 MG tablet   Commonly known as: CRESTOR   Take 10 mg by mouth at bedtime.           Follow-up Information    Follow up with Sharai Overbay A. Call in 1 week.   Contact information:   [address] [place]  [place] [address] [phone]          Signed: Temple Pacini 10/14/2011, 9:07 AM

## 2011-10-14 NOTE — Progress Notes (Signed)
UR COMPLTED

## 2011-10-16 MED FILL — Sodium Chloride IV Soln 0.9%: INTRAVENOUS | Qty: 1000 | Status: AC

## 2011-10-16 MED FILL — Heparin Sodium (Porcine) Inj 1000 Unit/ML: INTRAMUSCULAR | Qty: 30 | Status: AC

## 2011-11-05 ENCOUNTER — Other Ambulatory Visit: Payer: Self-pay | Admitting: Neurosurgery

## 2011-11-05 ENCOUNTER — Ambulatory Visit
Admission: RE | Admit: 2011-11-05 | Discharge: 2011-11-05 | Disposition: A | Discharge status: Home / Self Care | Payer: Medicare Other | Source: Ambulatory Visit | Attending: Neurosurgery | Admitting: Neurosurgery

## 2011-11-05 DIAGNOSIS — S32009A Unspecified fracture of unspecified lumbar vertebra, initial encounter for closed fracture: Secondary | ICD-10-CM

## 2011-12-22 ENCOUNTER — Other Ambulatory Visit: Payer: Self-pay | Admitting: Neurosurgery

## 2011-12-22 DIAGNOSIS — M545 Low back pain: Secondary | ICD-10-CM

## 2011-12-22 DIAGNOSIS — M25571 Pain in right ankle and joints of right foot: Secondary | ICD-10-CM

## 2012-01-13 ENCOUNTER — Ambulatory Visit
Admission: RE | Admit: 2012-01-13 | Discharge: 2012-01-13 | Disposition: A | Discharge status: Home / Self Care | Payer: Medicare Other | Source: Ambulatory Visit | Attending: Neurosurgery | Admitting: Neurosurgery

## 2012-01-13 DIAGNOSIS — M25571 Pain in right ankle and joints of right foot: Secondary | ICD-10-CM

## 2012-01-13 DIAGNOSIS — M545 Low back pain: Secondary | ICD-10-CM

## 2012-02-25 ENCOUNTER — Other Ambulatory Visit: Payer: Self-pay | Admitting: Neurosurgery

## 2012-02-25 DIAGNOSIS — M48061 Spinal stenosis, lumbar region without neurogenic claudication: Secondary | ICD-10-CM

## 2012-04-26 ENCOUNTER — Ambulatory Visit
Admission: RE | Admit: 2012-04-26 | Discharge: 2012-04-26 | Disposition: A | Discharge status: Home / Self Care | Payer: Medicare Other | Source: Ambulatory Visit | Attending: Neurosurgery | Admitting: Neurosurgery

## 2012-04-26 DIAGNOSIS — M48061 Spinal stenosis, lumbar region without neurogenic claudication: Secondary | ICD-10-CM

## 2012-04-27 ENCOUNTER — Other Ambulatory Visit: Payer: Medicare Other

## 2012-09-02 ENCOUNTER — Other Ambulatory Visit: Payer: Self-pay | Admitting: Neurosurgery

## 2012-09-02 DIAGNOSIS — M48061 Spinal stenosis, lumbar region without neurogenic claudication: Secondary | ICD-10-CM

## 2012-09-06 ENCOUNTER — Other Ambulatory Visit: Payer: Medicare Other

## 2012-09-09 ENCOUNTER — Other Ambulatory Visit: Payer: Medicare Other

## 2012-09-09 ENCOUNTER — Inpatient Hospital Stay: Admission: RE | Admit: 2012-09-09 | Payer: Medicare Other | Source: Ambulatory Visit

## 2012-09-13 ENCOUNTER — Ambulatory Visit
Admission: RE | Admit: 2012-09-13 | Discharge: 2012-09-13 | Disposition: A | Discharge status: Home / Self Care | Payer: Medicare Other | Source: Ambulatory Visit | Attending: Neurosurgery | Admitting: Neurosurgery

## 2012-09-13 VITALS — BP 166/89 | HR 72

## 2012-09-13 DIAGNOSIS — M48061 Spinal stenosis, lumbar region without neurogenic claudication: Secondary | ICD-10-CM

## 2012-09-13 MED ORDER — IOHEXOL 180 MG/ML  SOLN
15.0000 mL | Freq: Once | INTRAMUSCULAR | Status: AC | PRN
  Administered 2012-09-13: 15 mL via INTRATHECAL

## 2012-09-13 MED ORDER — DIAZEPAM 5 MG PO TABS
10.0000 mg | ORAL_TABLET | Freq: Once | ORAL | Status: AC
  Administered 2012-09-13: 10 mg via ORAL

## 2012-09-13 MED ORDER — ONDANSETRON HCL 4 MG/2ML IJ SOLN
4.0000 mg | Freq: Once | INTRAMUSCULAR | Status: AC
  Administered 2012-09-13: 4 mg via INTRAMUSCULAR

## 2012-09-13 MED ORDER — MEPERIDINE HCL 100 MG/ML IJ SOLN
75.0000 mg | Freq: Once | INTRAMUSCULAR | Status: AC
  Administered 2012-09-13: 75 mg via INTRAMUSCULAR

## 2012-09-30 ENCOUNTER — Other Ambulatory Visit: Payer: Self-pay | Admitting: Neurosurgery

## 2012-09-30 ENCOUNTER — Encounter (HOSPITAL_COMMUNITY): Payer: Self-pay | Admitting: Pharmacy Technician

## 2012-10-10 ENCOUNTER — Encounter (HOSPITAL_COMMUNITY): Payer: Self-pay | Admitting: Respiratory Therapy

## 2012-10-11 ENCOUNTER — Encounter (HOSPITAL_COMMUNITY)
Admission: RE | Admit: 2012-10-11 | Discharge: 2012-10-11 | Disposition: A | Discharge status: Home / Self Care | Payer: Medicare Other | Source: Ambulatory Visit | Attending: Neurosurgery | Admitting: Neurosurgery

## 2012-10-11 ENCOUNTER — Encounter (HOSPITAL_COMMUNITY): Payer: Self-pay

## 2012-10-11 HISTORY — DX: Gastro-esophageal reflux disease without esophagitis: K21.9

## 2012-10-11 HISTORY — DX: Fibromyalgia: M79.7

## 2012-10-11 LAB — SURGICAL PCR SCREEN
MRSA, PCR: NEGATIVE
Staphylococcus aureus: NEGATIVE

## 2012-10-11 LAB — TYPE AND SCREEN
ABO/RH(D): O POS
Antibody Screen: NEGATIVE

## 2012-10-11 LAB — BASIC METABOLIC PANEL
GFR calc Af Amer: >90 mL/min (ref >90)
GFR calc non Af Amer: >90 mL/min (ref >90)
Potassium: 3.9 mEq/L (ref 3.5–5.1)
Sodium: 135 mEq/L (ref 135–145)

## 2012-10-11 LAB — CBC WITH DIFFERENTIAL/PLATELET
Basophils Absolute: 0 10*3/uL (ref 0.0–0.1)
Basophils Relative: 0 % (ref 0–1)
HCT: 43.5 % (ref 36.0–46.0)
MCHC: 35.4 g/dL (ref 30.0–36.0)
Monocytes Absolute: 0.5 10*3/uL (ref 0.1–1.0)
Neutro Abs: 3.9 10*3/uL (ref 1.7–7.7)
RDW: 14.3 % (ref 11.5–15.5)

## 2012-10-11 NOTE — Pre-Procedure Instructions (Signed)
Patricia Stein  10/11/2012   Your procedure is scheduled on:  Friday October 14, 2012  Report to [facility] at 1125 AM.  Call this number if you have problems the morning of surgery: [phone]   Remember:   Do not eat food or drink liquids after midnight.   Take these medicines the morning of surgery with A SIP OF WATER: Alprazolam[ Xanax], Amlodipine[ Norvasc], Cymbalta [Duloxetine], Synthroid, Metoprolol, Prilosec,  And MS Contin if needed for pain. Bring inhaler Proventil with you day of surgery.   Do not wear jewelry, make-up or nail polish.  Do not wear lotions, powders, or perfumes. You may wear deodorant.  Do not shave 48 hours prior to surgery. Do not bring valuables to the hospital.  Contacts, dentures or bridgework may not be worn into surgery.  Leave suitcase in the car. After surgery it may be brought to your room.  For patients admitted to the hospital, checkout time is 11:00 AM the day of  discharge.   Patients discharged the day of surgery will not be allowed to drive  home.   Special Instructions: Shower using CHG 2 nights before surgery and the night before surgery.  If you shower the day of surgery use CHG.  Use special wash - you have one bottle of CHG for all showers.  You should use approximately 1/3 of the bottle for each shower.   Please read over the following fact sheets that you were given: Pain Booklet, Coughing and Deep Breathing, MRSA Information and Surgical Site Infection Prevention

## 2012-10-12 ENCOUNTER — Encounter (HOSPITAL_COMMUNITY): Payer: Self-pay | Admitting: Vascular Surgery

## 2012-10-12 NOTE — Consult Note (Signed)
Anesthesia Chart Review: Patient is a 67 year old female scheduled for left L2-3 extreme lumber interbody fusion by Dr. Jordan Likes on 10/14/12.  She is s/p L3-5 posterior lateral arthrodesis on 10/13/11. Other medical history includes smoking, DM2, hypothyroidism, COPD, OSA, anxiety, HTN, murmur, obesity, fibromyalgia, hypercholesterolemia.  She reported hypotension in PACU and welts from an undetermined source from her surgery ~ 2010, and feels she is difficult to wake up after anesthesia.  PCP is Dr. Kirstie Peri.  She has been evaluated by Cardiologist Dr. Antoine Poche in the past for chest pain in July 2011, and a stress test and echo were ordered (see below). He last saw her in August of 2011.   She had a stress test at [facility] on 04/24/10 that showed probably normal LV perfusion. Global LV systolic function was normal, EF 66%, normal wall motion, small, fixed apical defect consistent with attenuation artifact. Echo on that same day showed EF 60-65%, normal LV systolic function, trace MR. Carotid duplex showed no right ICA stenosis and < 50% left ICA stenosis. [facility] records are scanned under the Media tab under Correspondence dated 10/15/11.)  Her last cath was done in 2006 and showed:  1. Hemodynamics: Left ventricular pressure 119/8 mmHg, arterial pressure 119/70 mmHg.  2. Ventriculography: Ejection fraction 60%, no wall motion abnormalities.  3. Selective coronary angiography: The left main coronary artery was a large caliber vessel with no evidence of flow limiting disease. 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 The diagonal branch was free of flow limiting disease. The circumflex coronary  artery had a proximal 30% stenosis. The marginal branches were free of flow-limiting disease. Right coronary artery is a large caliber vessel with some narrowing of approximately 20 to 30% in the midsection of the vessel.    Her EKG from 10/11/12 showed NSR, non-specific T wave abnormality.  It was not felt significantly changed since her prior EKG on 10/05/11.  CXR from 10/11/12 showed no active disease. Stable central mild bronchitic changes.   Preoperative labs reviewed.    Her last anesthesia records are in Epic for review if needed. Her EKG is stable. Would anticipate she can proceed as planned if no acute change in her status.  She will be evaluated by her assigned anesthesiologist on the day of surgery.  Shonna Chock, PA-C 10/12/12 1246

## 2012-10-13 MED ORDER — CEFAZOLIN SODIUM-DEXTROSE 2-3 GM-% IV SOLR
2.0000 g | INTRAVENOUS | Status: AC
  Administered 2012-10-14: 2 g via INTRAVENOUS
  Filled 2012-10-13: qty 50

## 2012-10-14 ENCOUNTER — Encounter (HOSPITAL_COMMUNITY)
Admission: RE | Disposition: A | Discharge status: Home / Self Care | Payer: Self-pay | Source: Ambulatory Visit | Attending: Neurosurgery

## 2012-10-14 ENCOUNTER — Inpatient Hospital Stay (HOSPITAL_COMMUNITY): Payer: Medicare Other | Admitting: Vascular Surgery

## 2012-10-14 ENCOUNTER — Encounter (HOSPITAL_COMMUNITY): Payer: Self-pay | Admitting: Vascular Surgery

## 2012-10-14 ENCOUNTER — Inpatient Hospital Stay (HOSPITAL_COMMUNITY)
Admission: RE | Admit: 2012-10-14 | Discharge: 2012-10-16 | DRG: 460 | Disposition: A | Discharge status: Home / Self Care | Payer: Medicare Other | Source: Ambulatory Visit | Attending: Neurosurgery | Admitting: Neurosurgery

## 2012-10-14 ENCOUNTER — Inpatient Hospital Stay (HOSPITAL_COMMUNITY): Payer: Medicare Other

## 2012-10-14 ENCOUNTER — Encounter (HOSPITAL_COMMUNITY): Payer: Self-pay | Admitting: Neurosurgery

## 2012-10-14 ENCOUNTER — Encounter (HOSPITAL_COMMUNITY): Payer: Self-pay | Admitting: *Deleted

## 2012-10-14 DIAGNOSIS — IMO0001 Reserved for inherently not codable concepts without codable children: Secondary | ICD-10-CM | POA: Diagnosis present

## 2012-10-14 DIAGNOSIS — E119 Type 2 diabetes mellitus without complications: Secondary | ICD-10-CM | POA: Diagnosis present

## 2012-10-14 DIAGNOSIS — J4489 Other specified chronic obstructive pulmonary disease: Secondary | ICD-10-CM | POA: Diagnosis present

## 2012-10-14 DIAGNOSIS — F17200 Nicotine dependence, unspecified, uncomplicated: Secondary | ICD-10-CM | POA: Diagnosis present

## 2012-10-14 DIAGNOSIS — Z9981 Dependence on supplemental oxygen: Secondary | ICD-10-CM

## 2012-10-14 DIAGNOSIS — M5137 Other intervertebral disc degeneration, lumbosacral region: Principal | ICD-10-CM | POA: Diagnosis present

## 2012-10-14 DIAGNOSIS — I10 Essential (primary) hypertension: Secondary | ICD-10-CM | POA: Diagnosis present

## 2012-10-14 DIAGNOSIS — M48062 Spinal stenosis, lumbar region with neurogenic claudication: Secondary | ICD-10-CM | POA: Diagnosis present

## 2012-10-14 DIAGNOSIS — K219 Gastro-esophageal reflux disease without esophagitis: Secondary | ICD-10-CM | POA: Diagnosis present

## 2012-10-14 DIAGNOSIS — F411 Generalized anxiety disorder: Secondary | ICD-10-CM | POA: Diagnosis present

## 2012-10-14 DIAGNOSIS — J449 Chronic obstructive pulmonary disease, unspecified: Secondary | ICD-10-CM | POA: Diagnosis present

## 2012-10-14 DIAGNOSIS — G4730 Sleep apnea, unspecified: Secondary | ICD-10-CM | POA: Diagnosis present

## 2012-10-14 DIAGNOSIS — M51379 Other intervertebral disc degeneration, lumbosacral region without mention of lumbar back pain or lower extremity pain: Principal | ICD-10-CM | POA: Diagnosis present

## 2012-10-14 DIAGNOSIS — E039 Hypothyroidism, unspecified: Secondary | ICD-10-CM | POA: Diagnosis present

## 2012-10-14 HISTORY — PX: ANTERIOR LAT LUMBAR FUSION: SHX1168

## 2012-10-14 SURGERY — ANTERIOR LATERAL LUMBAR FUSION 1 LEVEL
Anesthesia: General | Site: Spine Lumbar | Laterality: Left | Wound class: Clean

## 2012-10-14 MED ORDER — HYDROMORPHONE HCL PF 1 MG/ML IJ SOLN
0.2500 mg | INTRAMUSCULAR | Status: DC | PRN
  Administered 2012-10-14 (×4): 0.5 mg via INTRAVENOUS

## 2012-10-14 MED ORDER — BISACODYL 10 MG RE SUPP: 10.0000 mg | Freq: Every day | RECTAL | Status: DC | PRN

## 2012-10-14 MED ORDER — ALPRAZOLAM 0.5 MG PO TABS
0.5000 mg | ORAL_TABLET | Freq: Two times a day (BID) | ORAL | Status: DC | PRN
  Administered 2012-10-16: 0.5 mg via ORAL
  Filled 2012-10-14: qty 1

## 2012-10-14 MED ORDER — LEVOTHYROXINE SODIUM 125 MCG PO TABS
125.0000 ug | ORAL_TABLET | Freq: Every day | ORAL | Status: DC
  Administered 2012-10-15 – 2012-10-16 (×2): 125 ug via ORAL
  Filled 2012-10-14 (×3): qty 1

## 2012-10-14 MED ORDER — FLEET ENEMA 7-19 GM/118ML RE ENEM: 1.0000 | ENEMA | Freq: Once | RECTAL | Status: AC | PRN

## 2012-10-14 MED ORDER — DEXAMETHASONE SODIUM PHOSPHATE 10 MG/ML IJ SOLN: 10.0000 mg | INTRAMUSCULAR | Status: DC

## 2012-10-14 MED ORDER — OXYCODONE HCL 5 MG/5ML PO SOLN: 5.0000 mg | Freq: Once | ORAL | Status: AC | PRN

## 2012-10-14 MED ORDER — SODIUM CHLORIDE 0.9 % IJ SOLN
3.0000 mL | Freq: Two times a day (BID) | INTRAMUSCULAR | Status: DC
  Administered 2012-10-15 (×2): 3 mL via INTRAVENOUS

## 2012-10-14 MED ORDER — PROPOFOL INFUSION 10 MG/ML OPTIME
INTRAVENOUS | Status: DC | PRN
  Administered 2012-10-14: 50 ug/kg/min via INTRAVENOUS

## 2012-10-14 MED ORDER — BACITRACIN 50000 UNITS IM SOLR
INTRAMUSCULAR | Status: AC
  Filled 2012-10-14: qty 1

## 2012-10-14 MED ORDER — PROMETHAZINE HCL 25 MG/ML IJ SOLN: 6.2500 mg | INTRAMUSCULAR | Status: DC | PRN

## 2012-10-14 MED ORDER — LACTATED RINGERS IV SOLN
INTRAVENOUS | Status: DC | PRN
  Administered 2012-10-14: 13:00:00 via INTRAVENOUS

## 2012-10-14 MED ORDER — ALBUTEROL SULFATE (5 MG/ML) 0.5% IN NEBU
2.5000 mg | INHALATION_SOLUTION | RESPIRATORY_TRACT | Status: AC
  Administered 2012-10-14: 2.5 mg via RESPIRATORY_TRACT

## 2012-10-14 MED ORDER — HYDROCHLOROTHIAZIDE 25 MG PO TABS
25.0000 mg | ORAL_TABLET | Freq: Every day | ORAL | Status: DC
  Administered 2012-10-14 – 2012-10-16 (×3): 25 mg via ORAL
  Filled 2012-10-14 (×3): qty 1

## 2012-10-14 MED ORDER — PROPOFOL 10 MG/ML IV BOLUS
INTRAVENOUS | Status: DC | PRN
  Administered 2012-10-14: 150 mg via INTRAVENOUS
  Administered 2012-10-14: 50 mg via INTRAVENOUS

## 2012-10-14 MED ORDER — MORPHINE SULFATE CR 30 MG PO TB12: 30.0000 mg | ORAL_TABLET | Freq: Three times a day (TID) | ORAL | Status: DC

## 2012-10-14 MED ORDER — DEXAMETHASONE SODIUM PHOSPHATE 10 MG/ML IJ SOLN
INTRAMUSCULAR | Status: AC
  Administered 2012-10-14: 10 mg via INTRAVENOUS
  Filled 2012-10-14: qty 1

## 2012-10-14 MED ORDER — ALUM & MAG HYDROXIDE-SIMETH 200-200-20 MG/5ML PO SUSP: 30.0000 mL | Freq: Four times a day (QID) | ORAL | Status: DC | PRN

## 2012-10-14 MED ORDER — AMLODIPINE BESYLATE 5 MG PO TABS
5.0000 mg | ORAL_TABLET | Freq: Every day | ORAL | Status: DC
  Administered 2012-10-14 – 2012-10-16 (×3): 5 mg via ORAL
  Filled 2012-10-14 (×3): qty 1

## 2012-10-14 MED ORDER — DIAZEPAM 5 MG PO TABS
ORAL_TABLET | ORAL | Status: AC
  Filled 2012-10-14: qty 1

## 2012-10-14 MED ORDER — 0.9 % SODIUM CHLORIDE (POUR BTL) OPTIME
TOPICAL | Status: DC | PRN
  Administered 2012-10-14: 1000 mL

## 2012-10-14 MED ORDER — ACETAMINOPHEN 325 MG PO TABS: 650.0000 mg | ORAL_TABLET | ORAL | Status: DC | PRN

## 2012-10-14 MED ORDER — ZOLPIDEM TARTRATE 5 MG PO TABS
5.0000 mg | ORAL_TABLET | Freq: Every evening | ORAL | Status: DC | PRN
  Administered 2012-10-14: 5 mg via ORAL
  Filled 2012-10-14: qty 1

## 2012-10-14 MED ORDER — LIDOCAINE HCL (CARDIAC) 20 MG/ML IV SOLN
INTRAVENOUS | Status: DC | PRN
  Administered 2012-10-14: 100 mg via INTRAVENOUS

## 2012-10-14 MED ORDER — HYDROMORPHONE HCL PF 1 MG/ML IJ SOLN
0.5000 mg | INTRAMUSCULAR | Status: DC | PRN
Start: 2012-10-14 — End: 2012-10-16
  Administered 2012-10-14 – 2012-10-15 (×4): 1 mg via INTRAVENOUS
  Filled 2012-10-14 (×4): qty 1

## 2012-10-14 MED ORDER — EPHEDRINE SULFATE 50 MG/ML IJ SOLN
INTRAMUSCULAR | Status: DC | PRN
  Administered 2012-10-14: 15 mg via INTRAVENOUS

## 2012-10-14 MED ORDER — NIACIN ER (ANTIHYPERLIPIDEMIC) 500 MG PO TBCR
1000.0000 mg | EXTENDED_RELEASE_TABLET | Freq: Two times a day (BID) | ORAL | Status: DC
  Administered 2012-10-14 – 2012-10-16 (×4): 1000 mg via ORAL
  Filled 2012-10-14 (×5): qty 2

## 2012-10-14 MED ORDER — METOPROLOL SUCCINATE ER 100 MG PO TB24
100.0000 mg | ORAL_TABLET | Freq: Two times a day (BID) | ORAL | Status: DC
  Administered 2012-10-14 – 2012-10-16 (×3): 100 mg via ORAL
  Filled 2012-10-14 (×5): qty 1

## 2012-10-14 MED ORDER — POTASSIUM CHLORIDE ER 10 MEQ PO TBCR
10.0000 meq | EXTENDED_RELEASE_TABLET | ORAL | Status: DC
  Administered 2012-10-15 – 2012-10-16 (×2): 10 meq via ORAL
  Filled 2012-10-14 (×3): qty 1

## 2012-10-14 MED ORDER — POLYETHYLENE GLYCOL 3350 17 G PO PACK
17.0000 g | PACK | Freq: Every day | ORAL | Status: DC | PRN
  Filled 2012-10-14: qty 1

## 2012-10-14 MED ORDER — MORPHINE SULFATE CR 15 MG PO TB12: 15.0000 mg | ORAL_TABLET | Freq: Three times a day (TID) | ORAL | Status: DC

## 2012-10-14 MED ORDER — PREGABALIN 50 MG PO CAPS
150.0000 mg | ORAL_CAPSULE | Freq: Two times a day (BID) | ORAL | Status: DC
  Administered 2012-10-15 – 2012-10-16 (×3): 150 mg via ORAL
  Filled 2012-10-14: qty 2
  Filled 2012-10-14 (×3): qty 3

## 2012-10-14 MED ORDER — HYDROCODONE-ACETAMINOPHEN 5-325 MG PO TABS
1.0000 | ORAL_TABLET | ORAL | Status: DC | PRN
  Administered 2012-10-15 – 2012-10-16 (×2): 2 via ORAL
  Filled 2012-10-14 (×2): qty 2

## 2012-10-14 MED ORDER — OXYCODONE-ACETAMINOPHEN 5-325 MG PO TABS
ORAL_TABLET | ORAL | Status: AC
  Filled 2012-10-14: qty 2

## 2012-10-14 MED ORDER — ALBUTEROL SULFATE (5 MG/ML) 0.5% IN NEBU
INHALATION_SOLUTION | RESPIRATORY_TRACT | Status: AC
  Filled 2012-10-14: qty 0.5

## 2012-10-14 MED ORDER — SODIUM CHLORIDE 0.9 % IV SOLN
250.0000 mL | INTRAVENOUS | Status: DC
  Administered 2012-10-15: 1000 mL via INTRAVENOUS

## 2012-10-14 MED ORDER — OXYCODONE HCL 5 MG PO TABS
5.0000 mg | ORAL_TABLET | Freq: Once | ORAL | Status: AC | PRN
  Administered 2012-10-14: 5 mg via ORAL

## 2012-10-14 MED ORDER — ALBUTEROL SULFATE HFA 108 (90 BASE) MCG/ACT IN AERS
1.0000 | INHALATION_SPRAY | Freq: Four times a day (QID) | RESPIRATORY_TRACT | Status: DC | PRN
Start: 2012-10-14 — End: 2012-10-16
  Filled 2012-10-14: qty 6.7

## 2012-10-14 MED ORDER — DIAZEPAM 5 MG PO TABS
5.0000 mg | ORAL_TABLET | Freq: Four times a day (QID) | ORAL | Status: DC | PRN
  Administered 2012-10-14 (×2): 5 mg via ORAL
  Administered 2012-10-15 – 2012-10-16 (×2): 10 mg via ORAL
  Filled 2012-10-14: qty 1
  Filled 2012-10-14 (×2): qty 2

## 2012-10-14 MED ORDER — LOSARTAN POTASSIUM 50 MG PO TABS
100.0000 mg | ORAL_TABLET | Freq: Every day | ORAL | Status: DC
  Administered 2012-10-14 – 2012-10-16 (×3): 100 mg via ORAL
  Filled 2012-10-14 (×3): qty 2

## 2012-10-14 MED ORDER — MIDAZOLAM HCL 5 MG/5ML IJ SOLN
INTRAMUSCULAR | Status: DC | PRN
  Administered 2012-10-14: 2 mg via INTRAVENOUS

## 2012-10-14 MED ORDER — DULOXETINE HCL 60 MG PO CPEP
60.0000 mg | ORAL_CAPSULE | Freq: Two times a day (BID) | ORAL | Status: DC
  Administered 2012-10-14 – 2012-10-16 (×4): 60 mg via ORAL
  Filled 2012-10-14 (×5): qty 1

## 2012-10-14 MED ORDER — ATORVASTATIN CALCIUM 40 MG PO TABS
40.0000 mg | ORAL_TABLET | Freq: Every day | ORAL | Status: DC
  Administered 2012-10-15: 40 mg via ORAL
  Filled 2012-10-14 (×2): qty 1

## 2012-10-14 MED ORDER — SODIUM CHLORIDE 0.9 % IR SOLN
Status: DC | PRN
  Administered 2012-10-14: 13:00:00

## 2012-10-14 MED ORDER — FUROSEMIDE 40 MG PO TABS
40.0000 mg | ORAL_TABLET | ORAL | Status: DC
  Administered 2012-10-15 – 2012-10-16 (×2): 40 mg via ORAL
  Filled 2012-10-14 (×3): qty 1

## 2012-10-14 MED ORDER — OXYCODONE-ACETAMINOPHEN 5-325 MG PO TABS
1.0000 | ORAL_TABLET | ORAL | Status: DC | PRN
Start: 2012-10-14 — End: 2012-10-16
  Administered 2012-10-14 – 2012-10-16 (×5): 2 via ORAL
  Filled 2012-10-14 (×4): qty 2

## 2012-10-14 MED ORDER — CEFAZOLIN SODIUM 1-5 GM-% IV SOLN
1.0000 g | Freq: Three times a day (TID) | INTRAVENOUS | Status: AC
  Administered 2012-10-14 – 2012-10-15 (×2): 1 g via INTRAVENOUS
  Filled 2012-10-14 (×3): qty 50

## 2012-10-14 MED ORDER — SENNA 8.6 MG PO TABS
1.0000 | ORAL_TABLET | Freq: Two times a day (BID) | ORAL | Status: DC
  Administered 2012-10-15 – 2012-10-16 (×3): 8.6 mg via ORAL
  Filled 2012-10-14 (×4): qty 1

## 2012-10-14 MED ORDER — SODIUM CHLORIDE 0.9 % IJ SOLN: 3.0000 mL | INTRAMUSCULAR | Status: DC | PRN

## 2012-10-14 MED ORDER — OXYCODONE HCL 5 MG PO TABS
ORAL_TABLET | ORAL | Status: AC
  Filled 2012-10-14: qty 1

## 2012-10-14 MED ORDER — LOSARTAN POTASSIUM-HCTZ 100-25 MG PO TABS: 1.0000 | ORAL_TABLET | Freq: Every day | ORAL | Status: DC

## 2012-10-14 MED ORDER — SODIUM CHLORIDE 0.9 % IV SOLN
INTRAVENOUS | Status: AC
  Administered 2012-10-15: 1000 mL via INTRAVENOUS
  Filled 2012-10-14: qty 500

## 2012-10-14 MED ORDER — SUCCINYLCHOLINE CHLORIDE 20 MG/ML IJ SOLN
INTRAMUSCULAR | Status: DC | PRN
  Administered 2012-10-14: 120 mg via INTRAVENOUS

## 2012-10-14 MED ORDER — METFORMIN HCL ER 500 MG PO TB24
500.0000 mg | ORAL_TABLET | Freq: Three times a day (TID) | ORAL | Status: DC
  Administered 2012-10-14 – 2012-10-16 (×5): 500 mg via ORAL
  Filled 2012-10-14 (×7): qty 1

## 2012-10-14 MED ORDER — PHENOL 1.4 % MT LIQD: 1.0000 | OROMUCOSAL | Status: DC | PRN

## 2012-10-14 MED ORDER — HYDROMORPHONE HCL PF 1 MG/ML IJ SOLN
INTRAMUSCULAR | Status: AC
  Filled 2012-10-14: qty 1

## 2012-10-14 MED ORDER — ARTIFICIAL TEARS OP OINT
TOPICAL_OINTMENT | OPHTHALMIC | Status: DC | PRN
  Administered 2012-10-14: 1 via OPHTHALMIC

## 2012-10-14 MED ORDER — PREGABALIN 75 MG PO CAPS: 150.0000 mg | ORAL_CAPSULE | Freq: Two times a day (BID) | ORAL | Status: DC

## 2012-10-14 MED ORDER — MENTHOL 3 MG MT LOZG: 1.0000 | LOZENGE | OROMUCOSAL | Status: DC | PRN

## 2012-10-14 MED ORDER — ONDANSETRON HCL 4 MG/2ML IJ SOLN
4.0000 mg | INTRAMUSCULAR | Status: DC | PRN
  Administered 2012-10-15: 4 mg via INTRAVENOUS
  Filled 2012-10-14: qty 2

## 2012-10-14 MED ORDER — ONDANSETRON HCL 4 MG/2ML IJ SOLN
INTRAMUSCULAR | Status: DC | PRN
  Administered 2012-10-14: 4 mg via INTRAVENOUS

## 2012-10-14 MED ORDER — PANTOPRAZOLE SODIUM 40 MG PO TBEC
80.0000 mg | DELAYED_RELEASE_TABLET | Freq: Every day | ORAL | Status: DC
  Administered 2012-10-14 – 2012-10-16 (×3): 80 mg via ORAL
  Filled 2012-10-14 (×3): qty 2

## 2012-10-14 MED ORDER — FENTANYL CITRATE 0.05 MG/ML IJ SOLN
INTRAMUSCULAR | Status: DC | PRN
  Administered 2012-10-14 (×4): 50 ug via INTRAVENOUS
  Administered 2012-10-14 (×3): 100 ug via INTRAVENOUS

## 2012-10-14 MED ORDER — MORPHINE SULFATE ER 15 MG PO TBCR
45.0000 mg | EXTENDED_RELEASE_TABLET | Freq: Three times a day (TID) | ORAL | Status: DC
  Administered 2012-10-14 – 2012-10-16 (×5): 45 mg via ORAL
  Filled 2012-10-14 (×5): qty 3

## 2012-10-14 MED ORDER — ACETAMINOPHEN 650 MG RE SUPP: 650.0000 mg | RECTAL | Status: DC | PRN

## 2012-10-14 SURGICAL SUPPLY — 56 items
5.0 x 45 bolt ×2 IMPLANT
5.0 x 50 bolt ×2 IMPLANT
6.5 x 45 bolt ×2 IMPLANT
6.5 x 50 bolt ×2 IMPLANT
6.5 x 60 bolt IMPLANT
BAG DECANTER FOR FLEXI CONT (MISCELLANEOUS) ×2 IMPLANT
BENZOIN TINCTURE PRP APPL 2/3 (GAUZE/BANDAGES/DRESSINGS) ×2 IMPLANT
BLADE SURG ROTATE 9660 (MISCELLANEOUS) IMPLANT
BONE MATRIX OSTEOCEL PLUS 5CC (Bone Implant) ×2 IMPLANT
CLOTH BEACON ORANGE TIMEOUT ST (SAFETY) ×2 IMPLANT
CONT SPEC 4OZ CLIKSEAL STRL BL (MISCELLANEOUS) IMPLANT
COVER BACK TABLE 24X17X13 BIG (DRAPES) ×2 IMPLANT
DERMABOND ADVANCED (GAUZE/BANDAGES/DRESSINGS) ×1
DERMABOND ADVANCED .7 DNX12 (GAUZE/BANDAGES/DRESSINGS) ×1 IMPLANT
DRAPE C-ARM 42X72 X-RAY (DRAPES) ×2 IMPLANT
DRAPE C-ARMOR (DRAPES) ×2 IMPLANT
DRAPE LAPAROTOMY 100X72X124 (DRAPES) ×2 IMPLANT
DRAPE POUCH INSTRU U-SHP 10X18 (DRAPES) ×2 IMPLANT
DRAPE SURG 17X23 STRL (DRAPES) ×4 IMPLANT
ELECT REM PT RETURN 9FT ADLT (ELECTROSURGICAL) ×2
ELECTRODE REM PT RTRN 9FT ADLT (ELECTROSURGICAL) ×1 IMPLANT
GAUZE SPONGE 4X4 16PLY XRAY LF (GAUZE/BANDAGES/DRESSINGS) IMPLANT
GLOVE ECLIPSE 8.5 STRL (GLOVE) ×2 IMPLANT
GLOVE EXAM NITRILE LRG STRL (GLOVE) IMPLANT
GLOVE EXAM NITRILE MD LF STRL (GLOVE) IMPLANT
GLOVE EXAM NITRILE XL STR (GLOVE) IMPLANT
GLOVE EXAM NITRILE XS STR PU (GLOVE) IMPLANT
GOWN BRE IMP SLV AUR LG STRL (GOWN DISPOSABLE) IMPLANT
GOWN BRE IMP SLV AUR XL STRL (GOWN DISPOSABLE) ×4 IMPLANT
GOWN STRL REIN 2XL LVL4 (GOWN DISPOSABLE) IMPLANT
GUIDE PINS ×4 IMPLANT
IMPLANT COROENT LDTXL 10X18X50 (Intraocular Lens) ×2 IMPLANT
KIT BASIN OR (CUSTOM PROCEDURE TRAY) ×2 IMPLANT
KIT DILATOR XLIF 5 (KITS) ×1 IMPLANT
KIT MAXCESS (KITS) ×2 IMPLANT
KIT NEEDLE NVM5 EMG ELECT (KITS) ×1 IMPLANT
KIT NEEDLE NVM5 EMG ELECTRODE (KITS) ×1
KIT ROOM TURNOVER OR (KITS) ×2 IMPLANT
KIT XLIF (KITS) ×1
NEEDLE HYPO 22GX1.5 SAFETY (NEEDLE) ×2 IMPLANT
NS IRRIG 1000ML POUR BTL (IV SOLUTION) ×2 IMPLANT
PACK LAMINECTOMY NEURO (CUSTOM PROCEDURE TRAY) ×2 IMPLANT
PUTTY BONE DBX 5CC MIX (Putty) ×2 IMPLANT
SPONGE GAUZE 4X4 12PLY (GAUZE/BANDAGES/DRESSINGS) ×2 IMPLANT
SPONGE LAP 4X18 X RAY DECT (DISPOSABLE) IMPLANT
STRIP CLOSURE SKIN 1/2X4 (GAUZE/BANDAGES/DRESSINGS) ×2 IMPLANT
SUT VIC AB 2-0 CT1 18 (SUTURE) ×2 IMPLANT
SUT VIC AB 3-0 SH 8-18 (SUTURE) ×2 IMPLANT
SYR 20ML ECCENTRIC (SYRINGE) ×2 IMPLANT
TAPE CLOTH 3X10 TAN LF (GAUZE/BANDAGES/DRESSINGS) ×4 IMPLANT
TAPE CLOTH SURG 4X10 WHT LF (GAUZE/BANDAGES/DRESSINGS) ×2 IMPLANT
TOWEL OR 17X24 6PK STRL BLUE (TOWEL DISPOSABLE) ×2 IMPLANT
TOWEL OR 17X26 10 PK STRL BLUE (TOWEL DISPOSABLE) ×2 IMPLANT
TRAY FOLEY CATH 14FRSI W/METER (CATHETERS) ×2 IMPLANT
WATER STERILE IRR 1000ML POUR (IV SOLUTION) ×2 IMPLANT
decade 4 hole plate ×2 IMPLANT

## 2012-10-14 NOTE — Transfer of Care (Signed)
Immediate Anesthesia Transfer of Care Note  Patient: Patricia Stein  Procedure(s) Performed: Procedure(s) (LRB) with comments: ANTERIOR LATERAL LUMBAR FUSION 1 LEVEL (Left)  Patient Location: PACU  Anesthesia Type:General  Level of Consciousness: patient cooperative, lethargic and responds to stimulation  Airway & Oxygen Therapy: Patient Spontanous Breathing and Patient connected to nasal cannula oxygen  Post-op Assessment: Report given to PACU RN and Patient moving all extremities X 4  Post vital signs: Reviewed and stable  Complications: No apparent anesthesia complications

## 2012-10-14 NOTE — H&P (Signed)
Patricia Stein is an 67 y.o. female.   Chief Complaint: Back pain HPI: 67 year old female status post previous L3-S1 decompression and fusion. Patient presents now with worsening back pain and lower extremity symptoms consistent with neurogenic claudication failing conservative management. Workup demonstrates evidence of progressive breakdown of the L2-3 disc space with disc space collapse retrolisthesis and marked foraminal stenosis. Patient presents now for L2-3 decompression and fusion.  Past Medical History  Diagnosis Date  . Diabetes mellitus   . Hypothyroidism   . COPD (chronic obstructive pulmonary disease)   . Heart murmur     small per pt see [facility] cardiolgist  . Shortness of breath   . Sleep apnea     uses CPAP with oxygen sleep study done ~ 2 years ago in [place] at [facility]  . Anxiety   . Hypertension     stress test done in 2011, last seen heart MD in 2011  . Hypercholesteremia     pt reports that her levels are really high  . GERD (gastroesophageal reflux disease)   . Fibromyalgia   . Complication of anesthesia     pt reports with ~ 2010 they had trouble with her BP and getting it up she went to recovery room with welps on her unsure  what cause reaction    Past Surgical History  Procedure Date  . Abdominal hysterectomy   . Carpal tunnel release   . Tonsillectomy   . Tubal ligation   . Back surgery     x6    Family History  Problem Relation Age of Onset  . Anesthesia problems Neg Hx    Social History:  reports that she has been smoking Cigarettes.  She has a 55.5 pack-year smoking history. She has never used smokeless tobacco. She reports that she does not drink alcohol or use illicit drugs.  Allergies: No Known Allergies  Medications Prior to Admission  Medication Sig Dispense Refill  . albuterol (PROVENTIL HFA;VENTOLIN HFA) 108 (90 BASE) MCG/ACT inhaler Inhale 1 puff into the lungs every 6 (six) hours as needed. FOR WHEEZING       . ALPRAZolam  (XANAX) 0.5 MG tablet Take 0.5 mg by mouth 2 (two) times daily as needed. FOR ANXIETY       . amLODipine (NORVASC) 5 MG tablet Take 5 mg by mouth daily.        . DULoxetine (CYMBALTA) 60 MG capsule Take 60 mg by mouth 2 (two) times daily.       . furosemide (LASIX) 40 MG tablet Take 40 mg by mouth every morning.        Marland Kitchen levothyroxine (SYNTHROID, LEVOTHROID) 125 MCG tablet Take 125 mcg by mouth daily.      Marland Kitchen losartan-hydrochlorothiazide (HYZAAR) 100-25 MG per tablet Take 1 tablet by mouth daily.        . metFORMIN (GLUMETZA) 500 MG (MOD) 24 hr tablet Take 500 mg by mouth 3 (three) times daily.      . metoprolol (TOPROL-XL) 100 MG 24 hr tablet Take 100 mg by mouth 2 (two) times daily.        Marland Kitchen morphine (MS CONTIN) 15 MG 12 hr tablet Take 15 mg by mouth 3 (three) times daily. Takes with ms contin 30mg       . morphine (MS CONTIN) 30 MG 12 hr tablet Take 30 mg by mouth 3 (three) times daily. Takes with ms contin 15mg       . niacin (NIASPAN) 1000 MG CR tablet Take 1,000  mg by mouth 2 (two) times daily.        Marland Kitchen omeprazole (PRILOSEC) 20 MG capsule Take 20 mg by mouth 2 (two) times daily.        . polyethylene glycol (MIRALAX / GLYCOLAX) packet Take 17 g by mouth daily as needed.      . potassium chloride (K-DUR) 10 MEQ tablet Take 10 mEq by mouth every morning.        . pregabalin (LYRICA) 150 MG capsule Take 150 mg by mouth 2 (two) times daily.        . rosuvastatin (CRESTOR) 20 MG tablet Take 20 mg by mouth daily.        Results for orders placed during the hospital encounter of 10/14/12 (from the past 48 hour(s))  GLUCOSE, CAPILLARY     Status: Abnormal   Collection Time   10/14/12 11:47 AM      Component Value Range Comment   Glucose-Capillary 118 (*) 70 - 99 mg/dL    No results found.  Review of Systems  Constitutional: Negative.   HENT: Negative.   Eyes: Negative.   Respiratory: Negative.   Cardiovascular: Negative.   Gastrointestinal: Negative.   Genitourinary: Negative.     Musculoskeletal: Negative.   Skin: Negative.   Neurological: Negative.   Endo/Heme/Allergies: Negative.   Psychiatric/Behavioral: Negative.     Blood pressure 124/68, pulse 62, temperature 97.6 F (36.4 C), temperature source Oral, resp. rate 18, SpO2 93.00%. Physical Exam  Constitutional: She is oriented to person, place, and time. She appears well-developed and well-nourished.  HENT:  Head: Normocephalic and atraumatic.  Right Ear: External ear normal.  Left Ear: External ear normal.  Nose: Nose normal.  Mouth/Throat: Oropharynx is clear and moist.  Eyes: Conjunctivae normal and EOM are normal. Pupils are equal, round, and reactive to light. Right eye exhibits no discharge. Left eye exhibits no discharge.  Neck: Normal range of motion. Neck supple. No tracheal deviation present. No thyromegaly present.  Cardiovascular: Normal rate, regular rhythm, normal heart sounds and intact distal pulses.  Exam reveals no friction rub.   No murmur heard. Respiratory: Effort normal and breath sounds normal. No respiratory distress. She has no wheezes.  GI: Soft. Bowel sounds are normal. She exhibits no distension. There is no tenderness.  Musculoskeletal: Normal range of motion. She exhibits no edema and no tenderness.  Neurological: She is alert and oriented to person, place, and time. She has normal reflexes. She displays normal reflexes. No cranial nerve deficit. She exhibits normal muscle tone. Coordination normal.  Skin: Skin is warm and dry. No rash noted. No erythema. No pallor.  Psychiatric: She has a normal mood and affect. Her behavior is normal. Judgment and thought content normal.     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 Plan left anterior lateral retroperitoneal interbody decompression infusion (XLIF) with morselized allograft coupled with posterior percutaneous pedicle screw fixation. Risks and benefits have been explained. Patient wishes  to proceed.  Patricia Stein A 10/14/2012, 12:39 PM

## 2012-10-14 NOTE — Anesthesia Preprocedure Evaluation (Addendum)
Anesthesia Evaluation    History of Anesthesia Complications Negative for: history of anesthetic complications  Airway       Dental   Pulmonary shortness of breath and with exertion, COPD COPD inhaler, Current Smoker,          Cardiovascular hypertension, Pt. on home beta blockers     Neuro/Psych Anxiety    GI/Hepatic Neg liver ROS, GERD-  Medicated,  Endo/Other  diabetes, Oral Hypoglycemic AgentsHypothyroidism   Renal/GU negative Renal ROS     Musculoskeletal  (+) Fibromyalgia -, narcotic dependent  Abdominal   Peds  Hematology   Anesthesia Other Findings   Reproductive/Obstetrics                           Anesthesia Physical Anesthesia Plan  ASA: III  Anesthesia Plan: General   Post-op Pain Management:    Induction: Intravenous  Airway Management Planned: Oral ETT  Additional Equipment:   Intra-op Plan:   Post-operative Plan: Possible Post-op intubation/ventilation  Informed Consent: I have reviewed the patients History and Physical, chart, labs and discussed the procedure including the risks, benefits and alternatives for the proposed anesthesia with the patient or authorized representative who has indicated his/her understanding and acceptance.   Dental advisory given  Plan Discussed with: CRNA, Anesthesiologist and Surgeon  Anesthesia Plan Comments:         Anesthesia Quick Evaluation

## 2012-10-14 NOTE — Brief Op Note (Signed)
10/14/2012  2:57 PM  PATIENT:  Patricia Stein  67 y.o. female  PRE-OPERATIVE DIAGNOSIS:  stenosis  POST-OPERATIVE DIAGNOSIS:  stenosis  PROCEDURE:  Procedure(s) (LRB) with comments: ANTERIOR LATERAL LUMBAR FUSION 1 LEVEL (Left)  SURGEON:  Surgeon(s) and Role:    * Temple Pacini, MD - Primary    * Tia Alert, MD - Assisting  PHYSICIAN ASSISTANT:   ASSISTANTS:    ANESTHESIA:   general  EBL:  Total I/O In: -  Out: 160 [Urine:60; Blood:100]  BLOOD ADMINISTERED:none  DRAINS: none   LOCAL MEDICATIONS USED:  NONE  SPECIMEN:  No Specimen  DISPOSITION OF SPECIMEN:  N/A  COUNTS:  YES  TOURNIQUET:  * No tourniquets in log *  DICTATION: .Dragon Dictation  PLAN OF CARE: Admit to inpatient   PATIENT DISPOSITION:  PACU - hemodynamically stable.   Delay start of Pharmacological VTE agent (>24hrs) due to surgical blood loss or risk of bleeding: yes

## 2012-10-14 NOTE — Anesthesia Postprocedure Evaluation (Signed)
Anesthesia Post Note  Patient: Patricia Stein  Procedure(s) Performed: Procedure(s) (LRB): ANTERIOR LATERAL LUMBAR FUSION 1 LEVEL (Left)  Anesthesia type: general  Patient location: PACU  Post pain: Pain level controlled  Post assessment: Patient's Cardiovascular Status Stable  Last Vitals:  Filed Vitals:   10/14/12 1610  BP: 147/80  Pulse: 80  Temp:   Resp: 17    Post vital signs: Reviewed and stable  Level of consciousness: sedated  Complications: No apparent anesthesia complications

## 2012-10-14 NOTE — Anesthesia Procedure Notes (Addendum)
Procedure Name: Intubation Date/Time: 10/14/2012 12:53 PM Performed by: Angelica Pou Pre-anesthesia Checklist: Patient identified, Timeout performed, Emergency Drugs available, Suction available and Patient being monitored Patient Re-evaluated:Patient Re-evaluated prior to inductionOxygen Delivery Method: Circle system utilized Preoxygenation: Pre-oxygenation with 100% oxygen Intubation Type: IV induction Ventilation: Mask ventilation without difficulty Laryngoscope Size: Mac and 3 Grade View: Grade I Tube type: Oral Tube size: 7.0 mm Number of attempts: 1 Airway Equipment and Method: Stylet Placement Confirmation: ETT inserted through vocal cords under direct vision,  breath sounds checked- equal and bilateral and positive ETCO2 Secured at: 22 cm Tube secured with: Tape Dental Injury: Teeth and Oropharynx as per pre-operative assessment

## 2012-10-14 NOTE — Preoperative (Signed)
Beta Blockers   Reason not to administer Beta Blockers:Not Applicable, took metoprolol today. 

## 2012-10-14 NOTE — Op Note (Signed)
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  Left L2-3 anterior lateral plate instrumentation.  Surgeon:Mandela Bello A.Ebrahim Deremer, M.D.  Asst. Surgeon:  Yetta Barre  Anesthesia: General  Indication: 67 year old female status post previous L3-S1 fusion with progressive breakdown of the L2-3 interspace symptoms of back pain and neurogenic claudication. Workup demonstrates evidence of marked disc space collapse retrolisthesis and stenosis. Patient presents now for L2-3 decompression infusion in hopes of improving her symptoms.  Operative note: After induction of anesthesia, patient positioned in the right lateral decubitus position. Left flank prepped and draped sterilely. Intraoperative neural monitoring used throughout. 2 incisions were made; one overlying the disc space of L2-3 within the left flank and one posteriorly and the left flank. Using the more posterior incision access was gained into the retroperitoneal space. Freeing up the peroneal structures and moving nose anteriorly a introducer was then passed through the left flank wound and passed down to the left psoas muscle. Under fluoroscopic guidance the dilator was then docked onto the L2-3 disc space. Neuro monitoring was once again performed. No nerves were found to be adjacent. Guidewire was placed and progressive dilators were then passed. Self-retaining retractor was placed. Dilators were removed. Operative bed was stimulated directly and once again no nerves were found within the field. Shim was inserted into the posterior wall of the disc space. Distractor was further widened. The spaces incised a 15 blade in a  rectangular fashion. An aggressive disc space cleanout was then achieved using pituitary rongeurs and various curettes and scrapers. After thorough discectomy been achieved the disc space was sized and a 10 mm x 18 mm lordotic implant was found to be most appropriate. A 10 x 18 x 45 mm cage was then packed with morselized allograft and osteo- cell plus. This is impacted into place and positioned well on both AP and lateral fluoroscopy. Slides were removed and attention was then placed toward lateral plate instrumentation. A new basis decade 4 hole plate was used. Pilot holes were first made area and pins were passed and the plate was secured. Utilizing fluoroscopic guidance screws were passed into the bodies of L2 and L3 pedicle a being careful at L3 to avoid a previous posterior pedicle screw instrumentation. All 4 screws had good bicortical purchase. They were securely tightened and locking screws were engaged and their heads. The plate itself was given a final tightening. Final images revealed good position of the cage and bone graft as well as the lateral plate in both the AP and lateral planes. Wound is irrigated one final time. Hemostasis was assured with bipolar cautery. Retractor was removed. Wound is then closed in a typical fashion. Steri-Strips and sterile dressing were applied. There were no apparent complications. Patient tolerated the procedure well and returned to the recovery room postop.

## 2012-10-15 LAB — GLUCOSE, CAPILLARY
Glucose-Capillary: 177 mg/dL — ABNORMAL HIGH (ref 70–99)
Glucose-Capillary: 100 mg/dL — ABNORMAL HIGH (ref 70–99)

## 2012-10-15 NOTE — Evaluation (Signed)
Physical Therapy Evaluation Patient Details Name: Patricia Stein MRN: [id number] DOB: June 13, 1946 Today's Date: 10/15/2012 Time: 6213-0865 PT Time Calculation (min): 39 min  PT Assessment / Plan / Recommendation Clinical Impression  Pt s/p ALF L2-3. Pt moving well, although still requiring supervision to minguard for safety. Pt will benefit from skilled PT in the acute care setting in order to maximize functional mobility and safety prior to d/c home    PT Assessment  Patient needs continued PT services    Follow Up Recommendations  No PT follow up;Supervision for mobility/OOB    Does the patient have the potential to tolerate intense rehabilitation      Barriers to Discharge        Equipment Recommendations  None recommended by PT    Recommendations for Other Services     Frequency Min 5X/week    Precautions / Restrictions Precautions Precautions: Back Precaution Booklet Issued: Yes (comment) Precaution Comments: pt educated on 3/3 back precautions Required Braces or Orthoses: Spinal Brace Spinal Brace: Applied in sitting position   Pertinent Vitals/Pain Pain 4/10. Pain meds given prior to session.       Mobility  Bed Mobility Bed Mobility: Rolling Right;Right Sidelying to Sit;Sitting - Scoot to [place] of Bed;Sit to Sidelying Right Rolling Right: 4: Min guard Right Sidelying to Sit: 4: Min guard Sitting - Scoot to [place] of Bed: 4: Min guard Sit to Sidelying Right: 4: Min guard Details for Bed Mobility Assistance: VC for safe technique as pt twisting while completing bed mobility. Transfers Transfers: Sit to Stand;Stand to Sit Sit to Stand: 4: Min guard;With upper extremity assist;From bed;From chair/3-in-1;From toilet Stand to Sit: 4: Min guard;With upper extremity assist;To chair/3-in-1;To bed;To toilet Details for Transfer Assistance: Minguard for safety. Cues for safe hand placement to/from Rw as well as to avoid bending while  standing. Ambulation/Gait Ambulation/Gait Assistance: 4: Min guard Ambulation Distance (Feet): 150 Feet Assistive device: Rolling walker;None Ambulation/Gait Assistance Details: Minguard assist for safety without RW. Pt with no LOB or need for UE assist Gait Pattern: Within Functional Limits Stairs: Yes Stairs Assistance: 4: Min guard Stair Management Technique: Two rails;Step to pattern;Forwards Number of Stairs: 2     Shoulder Instructions     Exercises     PT Diagnosis: Difficulty walking;Acute pain  PT Problem List: Decreased activity tolerance;Decreased knowledge of use of DME;Decreased mobility;Decreased balance;Decreased safety awareness;Decreased knowledge of precautions;Pain PT Treatment Interventions: DME instruction;Gait training;Stair training;Functional mobility training;Therapeutic activities;Patient/family education   PT Goals Acute Rehab PT Goals PT Goal Formulation: With patient Time For Goal Achievement: 10/22/12 Potential to Achieve Goals: Good Pt will go Supine/Side to Sit: with modified independence PT Goal: Supine/Side to Sit - Progress: Goal set today Pt will go Sit to Supine/Side: with modified independence PT Goal: Sit to Supine/Side - Progress: Goal set today Pt will go Sit to Stand: with modified independence PT Goal: Sit to Stand - Progress: Goal set today Pt will go Stand to Sit: with modified independence PT Goal: Stand to Sit - Progress: Goal set today Pt will Transfer Bed to Chair/Chair to Bed: with modified independence PT Transfer Goal: Bed to Chair/Chair to Bed - Progress: Goal set today Pt will Ambulate: >150 feet;with modified independence;with least restrictive assistive device PT Goal: Ambulate - Progress: Goal set today Pt will Go Up / Down Stairs: 1-2 stairs;with modified independence PT Goal: Up/Down Stairs - Progress: Goal set today  Visit Information  Last PT Received On: 10/15/12 Assistance Needed: +1    Subjective  Data       Prior Functioning  Home Living Lives With: Spouse Available Help at Discharge: Family;Other (Comment) (works during the day) Type of Home: House Home Access: Stairs to enter Secretary/administrator of Steps: 2 Entrance Stairs-Rails: Right;Can reach both;Left Home Layout: One level Bathroom Shower/Tub: Walk-in Contractor: Standard Bathroom Accessibility: Yes How Accessible: Accessible via walker Home Adaptive Equipment: Shower chair with back Prior Function Level of Independence: Independent Able to Take Stairs?: Yes Vocation: Retired Musician: No difficulties Dominant Hand: Right    Cognition  Overall Cognitive Status: Appears within functional limits for tasks assessed/performed Arousal/Alertness: Awake/alert Orientation Level: Appears intact for tasks assessed Behavior During Session: [facility] for tasks performed    Extremity/Trunk Assessment Right Lower Extremity Assessment RLE ROM/Strength/Tone: Within functional levels RLE Sensation: WFL - Light Touch Left Lower Extremity Assessment LLE ROM/Strength/Tone: Within functional levels LLE Sensation: WFL - Light Touch   Balance    End of Session PT - End of Session Equipment Utilized During Treatment: Gait belt;Back brace Activity Tolerance: Patient tolerated treatment well Patient left: in bed;with call bell/phone within reach;with family/visitor present Nurse Communication: Mobility status  GP     Milana Kidney 10/15/2012, 1:01 PM  10/15/2012 Milana Kidney DPT PAGER: [phone] OFFICE: [phone]

## 2012-10-15 NOTE — Evaluation (Signed)
Occupational Therapy Evaluation Patient Details Name: Patricia Stein MRN: [id number] DOB: Feb 10, 1946 Today's Date: 10/15/2012 Time: 4098-1191 OT Time Calculation (min): 28 min  OT Assessment / Plan / Recommendation Clinical Impression  67 yo female admitted for ALF L2-3 with back brace. pt with previous surgery and progressing well. Ot to sign off acutely. Recommend no follow up    OT Assessment  Patient does not need any further OT services    Follow Up Recommendations  No OT follow up    Barriers to Discharge      Equipment Recommendations  None recommended by OT    Recommendations for Other Services    Frequency       Precautions / Restrictions Precautions Precautions: Back Precaution Booklet Issued: Yes (comment) Precaution Comments: pt educated on 3/3 back precautions Required Braces or Orthoses: Spinal Brace Spinal Brace: Applied in sitting position   Pertinent Vitals/Pain No pain reported at this time    ADL  Grooming: Wash/dry face;Teeth care;Denture care;Independent Where Assessed - Grooming: Unsupported standing Upper Body Bathing: Chest;Right arm;Left arm;Abdomen;Modified independent Where Assessed - Upper Body Bathing: Unsupported sit to stand Lower Body Bathing: Modified independent Where Assessed - Lower Body Bathing: Unsupported sit to stand Upper Body Dressing: Modified independent Where Assessed - Upper Body Dressing: Unsupported sitting Lower Body Dressing: Modified independent Where Assessed - Lower Body Dressing: Unsupported sit to stand Toilet Transfer: Modified independent Toilet Transfer Method: Sit to Barista: Regular height toilet Toileting - Clothing Manipulation and Hygiene: Modified independent Where Assessed - Engineer, mining and Hygiene: Sit to stand from 3-in-1 or toilet Tub/Shower Transfer: Supervision/safety Tub/Shower Transfer Method: Science writer: Walk in  shower Equipment Used: Gait belt;Back brace Transfers/Ambulation Related to ADLs: Pt ambulating mod I without need for DME ADL Comments: Pt able to cross bil Le for LB dressing. pt demonstrated ability to fix bil socks supported sitting. pt don / doff brace x2 during session. pt demonstrates shower transfer with ability to step into shower. Pt educated on bed mobility and demonstrated x2 during session.     OT Diagnosis:    OT Problem List:   OT Treatment Interventions:     OT Goals    Visit Information  Last OT Received On: 10/15/12 Assistance Needed: +1 PT/OT Co-Evaluation/Treatment: Yes    Subjective Data  Subjective: "I had a fusion before but I dont' remember all that stuff" Patient Stated Goal: to go home tomorrow   Prior Functioning     Home Living Lives With: Spouse Available Help at Discharge: Family;Other (Comment) Type of Home: House Home Access: Stairs to enter [employer] of Steps: 2 Entrance Stairs-Rails: Right;Can reach both;Left Home Layout: One level Bathroom Shower/Tub: Walk-in Contractor: Standard Bathroom Accessibility: Yes How Accessible: Accessible via walker Home Adaptive Equipment: Shower chair with back Prior Function Level of Independence: Independent Able to Take Stairs?: Yes Vocation: Retired Musician: No difficulties Dominant Hand: Right         Vision/Perception     Cognition  Overall Cognitive Status: Appears within functional limits for tasks assessed/performed Arousal/Alertness: Awake/alert Orientation Level: Appears intact for tasks assessed Behavior During Session: [facility] for tasks performed    Extremity/Trunk Assessment Right Upper Extremity Assessment RUE ROM/Strength/Tone: Within functional levels RUE Sensation: WFL - Light Touch RUE Coordination: WFL - gross/fine motor Left Upper Extremity Assessment LUE ROM/Strength/Tone: Within functional levels LUE Sensation: WFL -  Light Touch LUE Coordination: WFL - gross/fine motor  Trunk Assessment Trunk Assessment: Normal  Mobility Bed Mobility Bed Mobility: Rolling Right;Right Sidelying to Sit;Sitting - Scoot to [place] of Bed;Sit to Sidelying Right Rolling Right: 4: Min guard Right Sidelying to Sit: 4: Min guard Sitting - Scoot to [place] of Bed: 4: Min guard Sit to Sidelying Right: 4: Min guard Details for Bed Mobility Assistance: VC for safe technique as pt twisting while completing bed mobility. Pt demonstrates ability to complete with back precautions on second attempt Transfers Sit to Stand: 6: Modified independent (Device/Increase time);With upper extremity assist;From bed Stand to Sit: 6: Modified independent (Device/Increase time);With upper extremity assist;To bed Details for Transfer Assistance: pt ambulating without RW at end of session.     Shoulder Instructions     Exercise     Balance Balance Balance Assessed: Yes Dynamic Standing Balance Dynamic Standing - Balance Support: No upper extremity supported;During functional activity Dynamic Standing - Level of Assistance: 6: Modified independent (Device/Increase time)   End of Session OT - End of Session Activity Tolerance: Patient tolerated treatment well Patient left: in bed;with call bell/phone within reach Nurse Communication: Mobility status;Precautions  GO     Lucile Shutters 10/15/2012, 1:23 PM Pager: [phone]

## 2012-10-15 NOTE — Progress Notes (Signed)
Orthopedic Tech Progress Note Patient Details:  Patricia Stein 1946-08-10 [id number] Patient has lumbar fusion brace from pervious surgery and does not want a new brace. PT in room at time of Ortho Tech visit; PT stated that brace was fitting well and did not see the need for a new brace to be ordered but would double check with doctor to make sure.  Patient ID: KARTER HAIRE, female   DOB: Jan 08, 1946, 67 y.o.   MRN: [id number]   Orie Rout 10/15/2012, 10:51 AM

## 2012-10-15 NOTE — Plan of Care (Signed)
Problem: Phase I Progression Outcomes Goal: OOB as tolerated unless otherwise ordered Outcome: Progressing Patient able to be up ambulating in room with brace, walker and one stand by assist. Goal: Log roll for position change Outcome: Not Progressing Patient needs to be reminded frequently about no twisting, arching or bending. Goal: Initial discharge plan identified Outcome: Progressing Discussed discharge plans and how patient would be able to function upon returning home. Goal: PT/OT consults requested Outcome: Completed/Met Date Met:  10/15/12 Patient evaluated by both PT and OT. Goal: Hemodynamically stable Outcome: Progressing Vitals remain stable.

## 2012-10-15 NOTE — Progress Notes (Signed)
Postop day 1. Pain better controlled today. Pain control very difficult last night however. No lower extremity pain. Was able to stand and walk a little bit this morning.  She is afebrile. Her vitals are stable. Urine output good. Motor and sensory function of her lower extremities intact. Dressing is dry.  Status post L2-3 XLIF. Pain control still difficult. Will continue as an inpatient for 1 more day. Probable discharge tomorrow.

## 2012-10-16 MED ORDER — HYDROCODONE-ACETAMINOPHEN 5-325 MG PO TABS: 1.0000 | ORAL_TABLET | ORAL | Status: DC | PRN

## 2012-10-16 MED ORDER — DIAZEPAM 5 MG PO TABS: 5.0000 mg | ORAL_TABLET | Freq: Four times a day (QID) | ORAL | Status: DC | PRN

## 2012-10-16 NOTE — Discharge Summary (Signed)
Physician Discharge Summary  Patient ID: Patricia Stein MRN: [id number] DOB/AGE: 1945-12-30 67 y.o.  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 The patient tolerated the procedure well and was taken to the recovery room and then to the floor in stable condition. The hospital course was routine. There were no complications. The wound remained clean dry and intact. Pt had appropriate back soreness. No complaints of leg pain or new N/T/W. The patient remained afebrile with stable vital signs, and tolerated a regular diet. The patient continued to increase activities, and pain was well controlled with oral pain medications.   Consults: None  Significant Diagnostic Studies:  Results for orders placed during the hospital encounter of 10/14/12  GLUCOSE, CAPILLARY      Component Value Range   Glucose-Capillary 118 (*) 70 - 99 mg/dL  GLUCOSE, CAPILLARY      Component Value Range   Glucose-Capillary 159 (*) 70 - 99 mg/dL   Comment 1 Notify RN    GLUCOSE, CAPILLARY      Component Value Range   Glucose-Capillary 177 (*) 70 - 99 mg/dL   Comment 1 Documented in Chart     Comment 2 Notify RN    GLUCOSE, CAPILLARY      Component Value Range   Glucose-Capillary 100 (*) 70 - 99 mg/dL   Comment 1 Notify RN     Comment 2 Documented in Chart    GLUCOSE, CAPILLARY      Component Value Range   Glucose-Capillary 196 (*) 70 - 99 mg/dL    Dg Chest 2 View  1/91/4782  *RADIOLOGY REPORT*  Clinical Data: Lumbar fusion  CHEST - 2 VIEW  Comparison: 10/05/2011  Findings: No acute infiltrate or pulmonary edema. Cardiomediastinal silhouette is stable. Stable central mild bronchitic changes. Bony thorax is unremarkable.  IMPRESSION: No active disease.  No  significant change.  Stable central mild bronchitic changes.   Original Report Authenticated By: Natasha Mead, M.D.    Dg Lumbar Spine 2-3 Views  10/14/2012  *RADIOLOGY REPORT*  Clinical Data: L2-L3 fixation.  LUMBAR SPINE - 2-3 VIEW  Comparison: 09/13/2012 CT.  Findings: Remote L3-L5 trans pedicle screw fixation.  2 intraoperative images which demonstrate a left-sided plate and screw fixation device at the L2-L3 level. No acute hardware complication.  IMPRESSION: Intraoperative imaging of L2-L3 fixation.   Original Report Authenticated By: Jeronimo Greaves, M.D.     Antibiotics:  Anti-infectives     Start     Dose/Rate Route Frequency Ordered Stop   10/14/12 2030   ceFAZolin (ANCEF) IVPB 1 g/50 mL premix        1 g 100 mL/hr over 30 Minutes Intravenous Every 8 hours 10/14/12 2016 10/15/12 0411   10/14/12 1250   bacitracin 50,000 Units in sodium chloride irrigation 0.9 % 500 mL irrigation  Status:  Discontinued          As needed 10/14/12 1250 10/14/12 1503   10/14/12 1226   bacitracin 95621 UNITS injection     Comments: KASIK, TAMMY: cabinet override         10/14/12 1226 10/15/12 0029   10/14/12 0600   ceFAZolin (ANCEF) IVPB 2 g/50 mL premix        2 g 100 mL/hr over 30 Minutes Intravenous On call to  O.R. 10/13/12 1446 10/14/12 1300          Discharge Exam: Blood pressure 128/74, pulse 81, temperature 98.2 F (36.8 C), temperature source Oral, resp. rate 16, height 5\' 1"  (1.549 m), weight 80.423 kg (177 lb 4.8 oz), SpO2 100.00%. Neurologic: Grossly normal Incision okay  Discharge Medications:     Medication List     As of 10/16/2012  8:58 AM    TAKE these medications         albuterol 108 (90 BASE) MCG/ACT inhaler   Commonly known as: PROVENTIL HFA;VENTOLIN HFA   Inhale 1 puff into the lungs every 6 (six) hours as needed. FOR WHEEZING      ALPRAZolam 0.5 MG tablet   Commonly known as: XANAX   Take 0.5 mg by mouth 2 (two) times daily as needed. FOR ANXIETY      amLODipine 5  MG tablet   Commonly known as: NORVASC   Take 5 mg by mouth daily.      diazepam 5 MG tablet   Commonly known as: VALIUM   Take 1-2 tablets (5-10 mg total) by mouth every 6 (six) hours as needed (spasm).      DULoxetine 60 MG capsule   Commonly known as: CYMBALTA   Take 60 mg by mouth 2 (two) times daily.      furosemide 40 MG tablet   Commonly known as: LASIX   Take 40 mg by mouth every morning.      HYDROcodone-acetaminophen 5-325 MG per tablet   Commonly known as: NORCO/VICODIN   Take 1-2 tablets by mouth every 4 (four) hours as needed for pain.      levothyroxine 125 MCG tablet   Commonly known as: SYNTHROID, LEVOTHROID   Take 125 mcg by mouth daily.      losartan-hydrochlorothiazide 100-25 MG per tablet   Commonly known as: HYZAAR   Take 1 tablet by mouth daily.      metFORMIN 500 MG (MOD) 24 hr tablet   Commonly known as: GLUMETZA   Take 500 mg by mouth 3 (three) times daily.      metoprolol succinate 100 MG 24 hr tablet   Commonly known as: TOPROL-XL   Take 100 mg by mouth 2 (two) times daily.      morphine 15 MG 12 hr tablet   Commonly known as: MS CONTIN   Take 15 mg by mouth 3 (three) times daily. Takes with ms contin 30mg       morphine 30 MG 12 hr tablet   Commonly known as: MS CONTIN   Take 30 mg by mouth 3 (three) times daily. Takes with ms contin 15mg       niacin 1000 MG CR tablet   Commonly known as: NIASPAN   Take 1,000 mg by mouth 2 (two) times daily.      omeprazole 20 MG capsule   Commonly known as: PRILOSEC   Take 20 mg by mouth 2 (two) times daily.      polyethylene glycol packet   Commonly known as: MIRALAX / GLYCOLAX   Take 17 g by mouth daily as needed.      potassium chloride 10 MEQ tablet   Commonly known as: K-DUR   Take 10 mEq by mouth every morning.      pregabalin 150 MG capsule   Commonly known as: LYRICA   Take 150 mg by mouth 2 (two) times daily.      rosuvastatin 20 MG tablet   Commonly known as: CRESTOR  Take 20 mg  by mouth daily.        Disposition: Home  Final Dx: XLIF L2-3      Discharge Orders    Future Orders Please Complete By Expires   Diet - low sodium heart healthy      Increase activity slowly      Discharge instructions      Comments:   No driving, or bending twisting or lifting.   Call MD for:  temperature >100.4      Call MD for:  persistant nausea and vomiting      Call MD for:  severe uncontrolled pain      Call MD for:  redness, tenderness, or signs of infection (pain, swelling, redness, odor or green/yellow discharge around incision site)      Call MD for:  difficulty breathing, headache or visual disturbances         Follow-up Information    Follow up with POOL,HENRY A, MD. Schedule an appointment as soon as possible for a visit in 2 weeks.   Contact information:   [address] [place] [address] [phone]           Signed: Tia Alert 10/16/2012, 8:58 AM

## 2012-10-16 NOTE — Plan of Care (Signed)
Problem: Discharge Progression Outcomes Goal: Discharge plan in place and appropriate Outcome: Completed/Met Date Met:  10/16/12 Discharge ordered and in place.  Patient to go home with spouse. Goal: Pain controlled with appropriate interventions Outcome: Adequate for Discharge Pain medication routine discussed to be in place upon discharge. Goal: Tolerates diet Outcome: Completed/Met Date Met:  10/16/12 Taking diet without incident. Goal: Ambulates without assistance Outcome: Completed/Met Date Met:  10/16/12 Patient able to be up ambulating with minimal assist. Goal: Demonstrates proper use of assistive devices Outcome: Completed/Met Date Met:  10/16/12 Patient has used walker in home before and dons brace without difficulty.

## 2012-10-16 NOTE — Plan of Care (Signed)
Problem: Discharge Progression Outcomes Goal: Incision without S/S infection Outcome: Completed/Met Date Met:  10/16/12 Removed dressing to operative site; allowed spouse to see site to know what patient incision looked like post op.  Instructed on s/s of infection to report to physician.

## 2012-10-16 NOTE — Progress Notes (Signed)
Physical Therapy Treatment Patient Details Name: Patricia Stein MRN: [id number] DOB: 1946/03/21 Today's Date: 10/16/2012 Time: 4098-1191 PT Time Calculation (min): 15 min  PT Assessment / Plan / Recommendation Comments on Treatment Session  Pt moving well althouigh still requiring cueing throughout for safe technique to maintain back precautions. Plan to d/c home today    Follow Up Recommendations  No PT follow up;Supervision for mobility/OOB     Does the patient have the potential to tolerate intense rehabilitation     Barriers to Discharge        Equipment Recommendations  None recommended by PT    Recommendations for Other Services    Frequency Min 5X/week   Plan Discharge plan remains appropriate;Frequency remains appropriate    Precautions / Restrictions Precautions Precautions: Back Precaution Booklet Issued: Yes (comment) Precaution Comments: pt able to verbalize 3/3 back precuations. Cues to avoid twisting Required Braces or Orthoses: Spinal Brace Spinal Brace: Applied in sitting position   Pertinent Vitals/Pain Pain 4/10 in back. Pain meds given prior to session.     Mobility  Bed Mobility Bed Mobility: Rolling Right;Right Sidelying to Sit;Sitting - Scoot to [place] of Bed;Sit to Sidelying Right Rolling Right: 5: Supervision Right Sidelying to Sit: 5: Supervision Sitting - Scoot to Edge of Bed: 5: Supervision Sit to Sidelying Right: 5: Supervision Details for Bed Mobility Assistance: VC for safe technique as pt tempted to twist during transfer in/out of bed Transfers Transfers: Sit to Stand;Stand to Sit Sit to Stand: 6: Modified independent (Device/Increase time);With upper extremity assist;From bed Stand to Sit: 6: Modified independent (Device/Increase time);With upper extremity assist;To bed Ambulation/Gait Ambulation/Gait Assistance: 5: Supervision Ambulation Distance (Feet): 400 Feet Assistive device: Rolling walker Ambulation/Gait Assistance Details: VC  for safe distance to Rw as wel as upright posture. Pt with more comfort with RW, no balance deficits Gait Pattern: Within Functional Limits    Exercises     PT Diagnosis:    PT Problem List:   PT Treatment Interventions:     PT Goals Acute Rehab PT Goals PT Goal: Supine/Side to Sit - Progress: Progressing toward goal PT Goal: Sit to Supine/Side - Progress: Progressing toward goal PT Goal: Sit to Stand - Progress: Met PT Goal: Stand to Sit - Progress: Met PT Transfer Goal: Bed to Chair/Chair to Bed - Progress: Progressing toward goal PT Goal: Ambulate - Progress: Progressing toward goal  Visit Information  Last PT Received On: 10/16/12 Assistance Needed: +1    Subjective Data      Cognition  Overall Cognitive Status: Appears within functional limits for tasks assessed/performed Arousal/Alertness: Awake/alert Orientation Level: Appears intact for tasks assessed Behavior During Session: [facility] for tasks performed    Balance     End of Session PT - End of Session Equipment Utilized During Treatment: Gait belt;Back brace Activity Tolerance: Patient tolerated treatment well Patient left: in bed;with call bell/phone within reach;with family/visitor present Nurse Communication: Mobility status   GP     Milana Kidney 10/16/2012, 10:20 AM

## 2012-10-16 NOTE — Plan of Care (Signed)
Problem: Phase III Progression Outcomes Goal: Pain controlled on oral analgesia Outcome: Progressing Patient trying different combinations of medications to achieve optimal pain relief.

## 2012-10-16 NOTE — Plan of Care (Signed)
Problem: Phase III Progression Outcomes Goal: Activity at appropriate level-compared to baseline (UP IN CHAIR FOR HEMODIALYSIS)  Outcome: Progressing Patient having increased ease in getting up and moving about room. Goal: Demonstrates donning/doffing brace Outcome: Completed/Met Date Met:  10/16/12 Patient able to don brace without difficulty. Reinforced best placement for support to lumbar area. Goal: Demonstrates proper use of assistive devices Outcome: Completed/Met Date Met:  10/16/12 Patient uses walker at home and very familiar with its use.

## 2012-10-17 ENCOUNTER — Encounter (HOSPITAL_COMMUNITY): Payer: Self-pay | Admitting: Neurosurgery

## 2012-10-17 NOTE — Progress Notes (Signed)
Utilization review completed.  

## 2012-10-30 ENCOUNTER — Inpatient Hospital Stay (HOSPITAL_COMMUNITY): Payer: Medicare Other

## 2012-10-30 ENCOUNTER — Encounter (HOSPITAL_COMMUNITY): Payer: Self-pay | Admitting: Physical Medicine and Rehabilitation

## 2012-10-30 ENCOUNTER — Inpatient Hospital Stay (HOSPITAL_COMMUNITY)
Admission: EM | Admit: 2012-10-30 | Discharge: 2012-11-02 | DRG: 607 | Disposition: A | Discharge status: Home / Self Care | Payer: Medicare Other | Attending: Internal Medicine | Admitting: Internal Medicine

## 2012-10-30 DIAGNOSIS — G4733 Obstructive sleep apnea (adult) (pediatric): Secondary | ICD-10-CM

## 2012-10-30 DIAGNOSIS — J449 Chronic obstructive pulmonary disease, unspecified: Secondary | ICD-10-CM

## 2012-10-30 DIAGNOSIS — E669 Obesity, unspecified: Secondary | ICD-10-CM

## 2012-10-30 DIAGNOSIS — R0602 Shortness of breath: Secondary | ICD-10-CM

## 2012-10-30 DIAGNOSIS — R21 Rash and other nonspecific skin eruption: Secondary | ICD-10-CM

## 2012-10-30 DIAGNOSIS — R011 Cardiac murmur, unspecified: Secondary | ICD-10-CM

## 2012-10-30 DIAGNOSIS — I959 Hypotension, unspecified: Secondary | ICD-10-CM

## 2012-10-30 DIAGNOSIS — Z981 Arthrodesis status: Secondary | ICD-10-CM

## 2012-10-30 DIAGNOSIS — L500 Allergic urticaria: Principal | ICD-10-CM | POA: Diagnosis present

## 2012-10-30 DIAGNOSIS — F411 Generalized anxiety disorder: Secondary | ICD-10-CM

## 2012-10-30 DIAGNOSIS — L509 Urticaria, unspecified: Secondary | ICD-10-CM

## 2012-10-30 DIAGNOSIS — Z79899 Other long term (current) drug therapy: Secondary | ICD-10-CM

## 2012-10-30 DIAGNOSIS — Z9989 Dependence on other enabling machines and devices: Secondary | ICD-10-CM | POA: Diagnosis present

## 2012-10-30 DIAGNOSIS — E039 Hypothyroidism, unspecified: Secondary | ICD-10-CM

## 2012-10-30 DIAGNOSIS — M549 Dorsalgia, unspecified: Secondary | ICD-10-CM

## 2012-10-30 DIAGNOSIS — IMO0001 Reserved for inherently not codable concepts without codable children: Secondary | ICD-10-CM | POA: Diagnosis present

## 2012-10-30 DIAGNOSIS — R079 Chest pain, unspecified: Secondary | ICD-10-CM

## 2012-10-30 DIAGNOSIS — J4489 Other specified chronic obstructive pulmonary disease: Secondary | ICD-10-CM | POA: Diagnosis present

## 2012-10-30 DIAGNOSIS — S32009A Unspecified fracture of unspecified lumbar vertebra, initial encounter for closed fracture: Secondary | ICD-10-CM

## 2012-10-30 DIAGNOSIS — E871 Hypo-osmolality and hyponatremia: Secondary | ICD-10-CM | POA: Diagnosis present

## 2012-10-30 DIAGNOSIS — R0989 Other specified symptoms and signs involving the circulatory and respiratory systems: Secondary | ICD-10-CM

## 2012-10-30 DIAGNOSIS — I10 Essential (primary) hypertension: Secondary | ICD-10-CM

## 2012-10-30 DIAGNOSIS — F17200 Nicotine dependence, unspecified, uncomplicated: Secondary | ICD-10-CM

## 2012-10-30 DIAGNOSIS — M48062 Spinal stenosis, lumbar region with neurogenic claudication: Secondary | ICD-10-CM

## 2012-10-30 DIAGNOSIS — E785 Hyperlipidemia, unspecified: Secondary | ICD-10-CM | POA: Diagnosis present

## 2012-10-30 DIAGNOSIS — E119 Type 2 diabetes mellitus without complications: Secondary | ICD-10-CM

## 2012-10-30 DIAGNOSIS — E781 Pure hyperglyceridemia: Secondary | ICD-10-CM

## 2012-10-30 DIAGNOSIS — E7800 Pure hypercholesterolemia, unspecified: Secondary | ICD-10-CM | POA: Diagnosis present

## 2012-10-30 DIAGNOSIS — K219 Gastro-esophageal reflux disease without esophagitis: Secondary | ICD-10-CM

## 2012-10-30 LAB — CBC WITH DIFFERENTIAL/PLATELET
Basophils Relative: 0 % (ref 0–1)
Eosinophils Absolute: 0.1 10*3/uL (ref 0.0–0.7)
HCT: 42.5 % (ref 36.0–46.0)
Hemoglobin: 14.5 g/dL (ref 12.0–15.0)
MCH: 31.7 pg (ref 26.0–34.0)
MCHC: 34.1 g/dL (ref 30.0–36.0)
Monocytes Absolute: 0.5 10*3/uL (ref 0.1–1.0)
Monocytes Relative: 3 % (ref 3–12)
RDW: 14.7 % (ref 11.5–15.5)

## 2012-10-30 LAB — URINALYSIS, ROUTINE W REFLEX MICROSCOPIC
Hgb urine dipstick: NEGATIVE
Specific Gravity, Urine: 1.028 (ref 1.005–1.030)
Urobilinogen, UA: 1 mg/dL (ref 0.0–1.0)

## 2012-10-30 LAB — URINE MICROSCOPIC-ADD ON

## 2012-10-30 LAB — COMPREHENSIVE METABOLIC PANEL
Albumin: 3.3 g/dL — ABNORMAL LOW (ref 3.5–5.2)
BUN: 22 mg/dL (ref 6–23)
Calcium: 9.4 mg/dL (ref 8.4–10.5)
Creatinine, Ser: 1.21 mg/dL — ABNORMAL HIGH (ref 0.50–1.10)
Total Bilirubin: 0.4 mg/dL (ref 0.3–1.2)
Total Protein: 7.4 g/dL (ref 6.0–8.3)

## 2012-10-30 LAB — PROCALCITONIN: Procalcitonin: <0.1 ng/mL

## 2012-10-30 MED ORDER — MORPHINE SULFATE ER 15 MG PO TBCR
45.0000 mg | EXTENDED_RELEASE_TABLET | Freq: Three times a day (TID) | ORAL | Status: DC
  Administered 2012-10-30 – 2012-11-02 (×9): 45 mg via ORAL
  Filled 2012-10-30 (×9): qty 3

## 2012-10-30 MED ORDER — OXYCODONE HCL 5 MG PO TABS
5.0000 mg | ORAL_TABLET | ORAL | Status: DC | PRN
  Administered 2012-10-31 – 2012-11-02 (×5): 5 mg via ORAL
  Filled 2012-10-30 (×5): qty 1

## 2012-10-30 MED ORDER — HYDROMORPHONE HCL PF 1 MG/ML IJ SOLN: 0.5000 mg | INTRAMUSCULAR | Status: DC | PRN

## 2012-10-30 MED ORDER — SODIUM CHLORIDE 0.9 % IV BOLUS (SEPSIS)
1000.0000 mL | Freq: Once | INTRAVENOUS | Status: AC
  Administered 2012-10-30: 500 mL via INTRAVENOUS

## 2012-10-30 MED ORDER — DULOXETINE HCL 60 MG PO CPEP
60.0000 mg | ORAL_CAPSULE | Freq: Two times a day (BID) | ORAL | Status: DC
  Administered 2012-10-30 – 2012-11-02 (×6): 60 mg via ORAL
  Filled 2012-10-30 (×7): qty 1

## 2012-10-30 MED ORDER — GADOBENATE DIMEGLUMINE 529 MG/ML IV SOLN
15.0000 mL | Freq: Once | INTRAVENOUS | Status: AC | PRN
  Administered 2012-10-30: 15 mL via INTRAVENOUS

## 2012-10-30 MED ORDER — ONDANSETRON HCL 4 MG/2ML IJ SOLN: 4.0000 mg | Freq: Four times a day (QID) | INTRAMUSCULAR | Status: DC | PRN

## 2012-10-30 MED ORDER — DEXTROSE 5 % IV SOLN
1.0000 g | INTRAVENOUS | Status: DC
  Administered 2012-10-30: 1 g via INTRAVENOUS
  Filled 2012-10-30 (×2): qty 10

## 2012-10-30 MED ORDER — ALPRAZOLAM 0.5 MG PO TABS
0.5000 mg | ORAL_TABLET | Freq: Three times a day (TID) | ORAL | Status: DC | PRN
  Administered 2012-10-31: 0.5 mg via ORAL
  Filled 2012-10-30: qty 1

## 2012-10-30 MED ORDER — MORPHINE SULFATE 4 MG/ML IJ SOLN
4.0000 mg | INTRAMUSCULAR | Status: DC | PRN
  Administered 2012-10-30: 4 mg via INTRAVENOUS
  Filled 2012-10-30: qty 1

## 2012-10-30 MED ORDER — SODIUM CHLORIDE 0.9 % IJ SOLN: 3.0000 mL | INTRAMUSCULAR | Status: DC | PRN

## 2012-10-30 MED ORDER — INSULIN ASPART 100 UNIT/ML ~~LOC~~ SOLN
0.0000 [IU] | Freq: Three times a day (TID) | SUBCUTANEOUS | Status: DC
  Administered 2012-10-31: 2 [IU] via SUBCUTANEOUS
  Administered 2012-10-31 – 2012-11-01 (×3): 1 [IU] via SUBCUTANEOUS
  Administered 2012-11-01: 2 [IU] via SUBCUTANEOUS
  Administered 2012-11-02: 1 [IU] via SUBCUTANEOUS

## 2012-10-30 MED ORDER — SODIUM CHLORIDE 0.9 % IV SOLN: 250.0000 mL | INTRAVENOUS | Status: DC | PRN

## 2012-10-30 MED ORDER — ONDANSETRON HCL 4 MG PO TABS: 4.0000 mg | ORAL_TABLET | Freq: Four times a day (QID) | ORAL | Status: DC | PRN

## 2012-10-30 MED ORDER — ALBUTEROL SULFATE (5 MG/ML) 0.5% IN NEBU
2.5000 mg | INHALATION_SOLUTION | RESPIRATORY_TRACT | Status: DC | PRN
  Filled 2012-10-30: qty 0.5

## 2012-10-30 MED ORDER — ATORVASTATIN CALCIUM 40 MG PO TABS
40.0000 mg | ORAL_TABLET | Freq: Every day | ORAL | Status: DC
  Administered 2012-10-31 – 2012-11-01 (×2): 40 mg via ORAL
  Filled 2012-10-30 (×4): qty 1

## 2012-10-30 MED ORDER — PREGABALIN 50 MG PO CAPS
150.0000 mg | ORAL_CAPSULE | Freq: Two times a day (BID) | ORAL | Status: DC
  Administered 2012-10-30 – 2012-11-02 (×6): 150 mg via ORAL
  Filled 2012-10-30 (×6): qty 3

## 2012-10-30 MED ORDER — ALBUTEROL SULFATE HFA 108 (90 BASE) MCG/ACT IN AERS: 1.0000 | INHALATION_SPRAY | Freq: Four times a day (QID) | RESPIRATORY_TRACT | Status: DC | PRN

## 2012-10-30 MED ORDER — MORPHINE SULFATE ER 15 MG PO TBCR: 15.0000 mg | EXTENDED_RELEASE_TABLET | Freq: Three times a day (TID) | ORAL | Status: DC

## 2012-10-30 MED ORDER — DIPHENHYDRAMINE HCL 50 MG/ML IJ SOLN
25.0000 mg | INTRAMUSCULAR | Status: DC | PRN
Start: 2012-10-30 — End: 2012-10-31
  Administered 2012-10-31: 25 mg via INTRAVENOUS
  Filled 2012-10-30: qty 1

## 2012-10-30 MED ORDER — NIACIN ER (ANTIHYPERLIPIDEMIC) 500 MG PO TBCR
1000.0000 mg | EXTENDED_RELEASE_TABLET | Freq: Two times a day (BID) | ORAL | Status: DC
  Administered 2012-10-30 – 2012-11-02 (×6): 1000 mg via ORAL
  Filled 2012-10-30 (×7): qty 2

## 2012-10-30 MED ORDER — POLYETHYLENE GLYCOL 3350 17 G PO PACK
17.0000 g | PACK | Freq: Every day | ORAL | Status: DC
  Administered 2012-11-01 – 2012-11-02 (×2): 17 g via ORAL
  Filled 2012-10-30 (×4): qty 1

## 2012-10-30 MED ORDER — DIPHENHYDRAMINE HCL 25 MG PO CAPS
25.0000 mg | ORAL_CAPSULE | ORAL | Status: DC | PRN
  Administered 2012-10-30 – 2012-10-31 (×2): 25 mg via ORAL
  Filled 2012-10-30 (×2): qty 1

## 2012-10-30 MED ORDER — MORPHINE SULFATE CR 30 MG PO TB12: 30.0000 mg | ORAL_TABLET | Freq: Three times a day (TID) | ORAL | Status: DC

## 2012-10-30 MED ORDER — SODIUM CHLORIDE 0.9 % IV BOLUS (SEPSIS): 500.0000 mL | Freq: Once | INTRAVENOUS | Status: DC

## 2012-10-30 MED ORDER — IPRATROPIUM BROMIDE 0.02 % IN SOLN: 0.5000 mg | RESPIRATORY_TRACT | Status: DC | PRN

## 2012-10-30 MED ORDER — VANCOMYCIN HCL 10 G IV SOLR
1500.0000 mg | INTRAVENOUS | Status: DC
  Administered 2012-10-30: 1500 mg via INTRAVENOUS
  Filled 2012-10-30 (×2): qty 1500

## 2012-10-30 MED ORDER — ACETAMINOPHEN 650 MG RE SUPP: 650.0000 mg | Freq: Four times a day (QID) | RECTAL | Status: DC | PRN

## 2012-10-30 MED ORDER — ACETAMINOPHEN 325 MG PO TABS: 650.0000 mg | ORAL_TABLET | Freq: Four times a day (QID) | ORAL | Status: DC | PRN

## 2012-10-30 MED ORDER — LEVOTHYROXINE SODIUM 125 MCG PO TABS
125.0000 ug | ORAL_TABLET | Freq: Every day | ORAL | Status: DC
  Administered 2012-10-31 – 2012-11-02 (×3): 125 ug via ORAL
  Filled 2012-10-30 (×5): qty 1

## 2012-10-30 MED ORDER — DIAZEPAM 5 MG PO TABS: 5.0000 mg | ORAL_TABLET | Freq: Four times a day (QID) | ORAL | Status: DC | PRN

## 2012-10-30 MED ORDER — PANTOPRAZOLE SODIUM 40 MG PO TBEC
40.0000 mg | DELAYED_RELEASE_TABLET | Freq: Two times a day (BID) | ORAL | Status: DC
  Administered 2012-10-30 – 2012-10-31 (×2): 40 mg via ORAL
  Filled 2012-10-30 (×2): qty 1

## 2012-10-30 MED ORDER — ONDANSETRON HCL 4 MG/2ML IJ SOLN
4.0000 mg | Freq: Once | INTRAMUSCULAR | Status: AC
  Administered 2012-10-30: 4 mg via INTRAVENOUS
  Filled 2012-10-30: qty 2

## 2012-10-30 MED ORDER — SODIUM CHLORIDE 0.9 % IJ SOLN
3.0000 mL | Freq: Two times a day (BID) | INTRAMUSCULAR | Status: DC
  Administered 2012-10-31 – 2012-11-01 (×2): 3 mL via INTRAVENOUS

## 2012-10-30 MED ORDER — SODIUM CHLORIDE 0.9 % IV SOLN: INTRAVENOUS | Status: DC

## 2012-10-30 NOTE — H&P (Signed)
PCP:   Kirstie Peri, MD   Chief Complaint:  Diffuse itchy eruption for a few days.   HPI: This is a 67 year old female, with known history of DM-2, GERD, hypothyroidism, dyslipidemia, COPD, OSA on nocturnal CPAP, anxiety, fibromyalgia, HTN, s/p abdominal hysterectomy, s/p tonsillectomy, s/p carpal tunnel release, s/p multiple back surgeries (6 in total), most recently, Left L2-3 anterior lateral retroperitoneal diskectomy and interbody fusion on 10/14/12. Patient was hospitalized 10/14/12-10/16/12. Since discharge, she had been ambulating with a walker, although she didhave some back pain. She had developed a local erythematous reaction to her wound dressing, with blistering, and had applied an antibiotic ointment to the area, with resolution of blisters, but still has residual redness. On 10/25/12 , she slipped and fell from her front porch, while ambulating without her walker, and landed on her right side, thankfully missing the step, and insists she did not hit her head. Since then, she has had significant difficulty ambulating, and has right-sided back pain. On 10/27/12, she developed redness in her medial upper thighs, associated with soreness, and ocassional itching. The affected areas are indurated and circumscribed, and have spread to involve the lower abdomen, flanks, right axilla, upper chest, as well as back, in an uneven, asymmetrical distribution. Redness and swelling in the right malar region appeared on 10/29/12, and has also spread to involve her right ear. She has had subjective fevers and chills. Because of worsening symptoms, her husband brought her to ED today. Patient denies any new medication or environmental exposures.     Allergies:  No Known Allergies    Past Medical History  Diagnosis Date  . Diabetes mellitus   . Hypothyroidism   . COPD (chronic obstructive pulmonary disease)   . Heart murmur     small per pt see [facility] cardiolg[facility]  . Shortness of breath   . Sleep apnea      uses CPAP with oxygen sleep study done ~ 2 years ago in [place] at [facility]  . Anxiety   . Hypertension     stress test done in 2011, last seen heart MD in 2011  . Hypercholesteremia     pt reports that her levels are really high  . GERD (gastroesophageal reflux disease)   . Fibromyalgia   . Complication of anesthesia     pt reports with ~ 2010 they had trouble with her BP and getting it up she went to recovery room with welps on her unsure  what cause reaction    Past Surgical History  Procedure Date  . Abdominal hysterectomy   . Carpal tunnel release   . Tonsillectomy   . Tubal ligation   . Back surgery     x6  . Anterior lat lumbar fusion 10/14/2012    Procedure: ANTERIOR LATERAL LUMBAR FUSION 1 LEVEL;  Surgeon: Temple Pacini, MD;  Location: [facility];  Service: Neurosurgery;  Laterality: Left;    Prior to Admission medications   Medication Sig Start Date End Date Taking? Authorizing Provider  albuterol (PROVENTIL HFA;VENTOLIN HFA) 108 (90 BASE) MCG/ACT inhaler Inhale 1 puff into the lungs every 6 (six) hours as needed. For wheezing   Yes Historical Provider, MD  ALPRAZolam Prudy Feeler) 0.5 MG tablet Take 0.5 mg by mouth 3 (three) times daily as needed. For anxiety   Yes Historical Provider, MD  amLODipine (NORVASC) 5 MG tablet Take 5 mg by mouth daily.     Yes Historical Provider, MD  diazepam (VALIUM) 5 MG tablet Take 5-10 mg  by mouth every 6 (six) hours as needed. For muscle spasms 10/16/12  Yes Tia Alert, MD  DULoxetine (CYMBALTA) 60 MG capsule Take 60 mg by mouth 2 (two) times daily.    Yes Historical Provider, MD  furosemide (LASIX) 40 MG tablet Take 40 mg by mouth every morning.     Yes Historical Provider, MD  HYDROcodone-acetaminophen (NORCO/VICODIN) 5-325 MG per tablet Take 1-2 tablets by mouth every 4 (four) hours as needed. For pain 10/16/12  Yes Tia Alert, MD  levothyroxine (SYNTHROID, LEVOTHROID) 125 MCG tablet Take 125 mcg by mouth daily.   Yes  Historical Provider, MD  losartan-hydrochlorothiazide (HYZAAR) 100-25 MG per tablet Take 1 tablet by mouth daily.     Yes Historical Provider, MD  metFORMIN (GLUMETZA) 500 MG (MOD) 24 hr tablet Take 500 mg by mouth 3 (three) times daily.   Yes Historical Provider, MD  metoprolol (TOPROL-XL) 100 MG 24 hr tablet Take 100 mg by mouth 2 (two) times daily.     Yes Historical Provider, MD  morphine (MS CONTIN) 15 MG 12 hr tablet Take 15 mg by mouth 3 (three) times daily. Takes with ms contin 30mg    Yes Historical Provider, MD  morphine (MS CONTIN) 30 MG 12 hr tablet Take 30 mg by mouth 3 (three) times daily. Takes with ms contin 15mg    Yes Historical Provider, MD  niacin (NIASPAN) 1000 MG CR tablet Take 1,000 mg by mouth 2 (two) times daily.     Yes Historical Provider, MD  omeprazole (PRILOSEC) 20 MG capsule Take 20 mg by mouth 2 (two) times daily.     Yes Historical Provider, MD  polyethylene glycol (MIRALAX / GLYCOLAX) packet Take 17 g by mouth daily as needed. For constipation   Yes Historical Provider, MD  potassium chloride (K-DUR) 10 MEQ tablet Take 10 mEq by mouth every morning.     Yes Historical Provider, MD  pregabalin (LYRICA) 150 MG capsule Take 150 mg by mouth 2 (two) times daily.     Yes Historical Provider, MD  rosuvastatin (CRESTOR) 20 MG tablet Take 20 mg by mouth daily.   Yes Historical Provider, MD    Social History: Patient reports that she has been smoking Cigarettes.  She has a 55.5 pack-year smoking history. She has never used smokeless tobacco. She reports that she does not drink alcohol or use illicit drugs.  Family History  Problem Relation Age of Onset  . Anesthesia problems Neg Hx     Review of Systems:  As per HPI and chief complaint. Patent denies fatigue, appetite is somewhat diminished, but she has no weight loss, headache, blurred vision, difficulty in speaking, dysphagia, chest pain, cough, shortness of breath, orthopnea, paroxysmal nocturnal dyspnea, nausea,  diaphoresis, abdominal pain, vomiting, diarrhea, belching, heartburn, hematemesis, melena, dysuria, nocturia, urinary frequency, hematochezia. The rest of the systems review is negative.  Physical Exam:  General:  Patient does not appear to be in obvious acute distress. Alert, communicative, fully oriented, talking in complete sentences, not short of breath at rest. Does not look toxic.  SKIN: Erythematous, indurated and circumscribed areas of skin, involving both upper thighs, the surgical site tin her right lower back, and the lower abdomen, flanks, right axilla, upper chest, as well back, right malar region and right ear pinna in an uneven, asymmetrical distribution. HEENT:  No clinical pallor, no jaundice, no conjunctival injection or discharge. Throat is clear. Hydration appears fair.  NECK:  Supple, JVP not seen, no carotid bruits, no palpable  lymphadenopathy, no palpable goiter. CHEST:  Clinically clear to auscultation, no wheezes, no crackles. HEART:  Sounds 1 and 2 heard, normal, regular, no murmurs. ABDOMEN:  Obese, soft, non-tender, no palpable organomegaly, no palpable masses, normal bowel sounds. GENITALIA:  Not examined. LOWER EXTREMITIES:  No pitting edema, palpable peripheral pulses. MUSCULOSKELETAL SYSTEM:  Generalized osteoarthritic changes, otherwise, normal. Has pain in the back on straight leg raising bilaterally, right>left.  CENTRAL NERVOUS SYSTEM:  No focal neurologic deficit on gross examination.  Labs on Admission:  Results for orders placed during the hospital encounter of 10/30/12 (from the past 48 hour(s))  CBC WITH DIFFERENTIAL     Status: Abnormal   Collection Time   10/30/12  1:35 PM      Component Value Range Comment   WBC 13.4 (*) 4.0 - 10.5 K/uL    RBC 4.58  3.87 - 5.11 MIL/uL    Hemoglobin 14.5  12.0 - 15.0 g/dL    HCT 16.1  09.6 - 04.5 %    MCV 92.8  78.0 - 100.0 fL    MCH 31.7  26.0 - 34.0 pg    MCHC 34.1  30.0 - 36.0 g/dL    RDW 40.9  81.1 - 91.4 %     Platelets 301  150 - 400 K/uL    Neutrophils Relative 80 (*) 43 - 77 %    Neutro Abs 10.7 (*) 1.7 - 7.7 K/uL    Lymphocytes Relative 16  12 - 46 %    Lymphs Abs 2.1  0.7 - 4.0 K/uL    Monocytes Relative 3  3 - 12 %    Monocytes Absolute 0.5  0.1 - 1.0 K/uL    Eosinophils Relative 1  0 - 5 %    Eosinophils Absolute 0.1  0.0 - 0.7 K/uL    Basophils Relative 0  0 - 1 %    Basophils Absolute 0.0  0.0 - 0.1 K/uL   COMPREHENSIVE METABOLIC PANEL     Status: Abnormal   Collection Time   10/30/12  1:35 PM      Component Value Range Comment   Sodium 130 (*) 135 - 145 mEq/L    Potassium 3.6  3.5 - 5.1 mEq/L    Chloride 91 (*) 96 - 112 mEq/L    CO2 26  19 - 32 mEq/L    Glucose, Bld 114 (*) 70 - 99 mg/dL    BUN 22  6 - 23 mg/dL    Creatinine, Ser 7.82 (*) 0.50 - 1.10 mg/dL    Calcium 9.4  8.4 - 95.6 mg/dL    Total Protein 7.4  6.0 - 8.3 g/dL    Albumin 3.3 (*) 3.5 - 5.2 g/dL    AST 17  0 - 37 U/L    ALT 9  0 - 35 U/L    Alkaline Phosphatase 100  39 - 117 U/L    Total Bilirubin 0.4  0.3 - 1.2 mg/dL    GFR calc non Af Amer 46 (*) >90 mL/min    GFR calc Af Amer 54 (*) >90 mL/min   URINALYSIS, ROUTINE W REFLEX MICROSCOPIC     Status: Abnormal   Collection Time   10/30/12  1:36 PM      Component Value Range Comment   Color, Urine AMBER (*) YELLOW BIOCHEMICALS MAY BE AFFECTED BY COLOR   APPearance CLOUDY (*) CLEAR    Specific Gravity, Urine 1.028  1.005 - 1.030    pH 5.0  5.0 - 8.0  Glucose, UA NEGATIVE  NEGATIVE mg/dL    Hgb urine dipstick NEGATIVE  NEGATIVE    Bilirubin Urine MODERATE (*) NEGATIVE    Ketones, ur 15 (*) NEGATIVE mg/dL    Protein, ur NEGATIVE  NEGATIVE mg/dL    Urobilinogen, UA 1.0  0.0 - 1.0 mg/dL    Nitrite NEGATIVE  NEGATIVE    Leukocytes, UA TRACE (*) NEGATIVE   URINE MICROSCOPIC-ADD ON     Status: Abnormal   Collection Time   10/30/12  1:36 PM      Component Value Range Comment   Squamous Epithelial / LPF RARE  RARE    WBC, UA 0-2  <3 WBC/hpf    Casts HYALINE CASTS  (*) NEGATIVE GRANULAR CAST    Radiological Exams on Admission: No results found.  Assessment/Plan Active Problems:     1. Rash: Patient presented with an erythematous confluent eruption, which started initially in the bilateral upper thighs, and has progressed to involve trunk, face and right ear, since 10/27/12, although it appears that she had a blistering erythematous eruption in her surgical site at the time of discharge from her latest hospitalization on 10/16/12. She had had chills, subjective fever, and has a neutrophilic  leukocytosis at presentation, of 13.4. As she has had no known exposure to external allergens or new medication, and no antecedent respiratory tract illness, index of suspicion is high for an infective etiology, particularly, as affected areas are brawny, indurated, raised and sharply circumscribed, reminiscent of erysipelas. Will send off blood cultures, check Procalcitonin levels, commence broad spectrum antibiotic coverage with Vancomycin and Rocephin. Have requested ID consultation and discussed with Dr Judyann Munson. Dr Cliffton Asters will see patient on 10/31/12. .  2. Dehydration: BP is somewhat borderlone at this time, with SBP 100-91. Creatinine is 1.21, against a known baseline creatinine of 0.48 on 10/11/12. This may be due to dehydration, but one has to entertain the possibility of early sepsis. Will manage with iv fluids, and hold pre-admission anti-hypertensives and diuretics  for now.  3, Back pain: Patient has had residual back pain, after surgery on 10/14/12, but this has become significantly worse, particularly on right LE movement, after her fall on 10/25/12. She is scheduled to see Dr Julio Sicks on 11/02/12, so we shall invite him to participate in care. Meanwhile, we shall check pelvic X-ray. She may require MRI of her lumbar spine, but we shall defer this to the neurosurgical team.  4. Diabetes mellitus: his appears controlled, based on random blood glucose of  114. We shall hold Metformin for now, and manage with diet and SSI.  5. HTN (hypertension): See discussion in # 2 above. Anti-hypertensives are on hold.  6. Hypothyroidism: Continue thyroxine replacement therapy.  5. GERD (gastroesophageal reflux disease): Asymptomatic. Continue PPI.  6. COPD (chronic obstructive pulmonary disease): Stable/Asymptomatic. We shall manage with bronchodilators.  7. OSA on CPAP: Patient will be continued on nocturnal CPAP.   Further management will depend on clinical course.   Comment: Patient is FULL CODE.    Time Spent on Admission: 1 hour.   Audree Schrecengost,CHRISTOPHER 10/30/2012, 4:27 PM

## 2012-10-30 NOTE — Progress Notes (Signed)
ANTIBIOTIC CONSULT NOTE - INITIAL  Pharmacy Consult for Vancomycin Indication: Skin infection/diffuse itchy eruption  No Known Allergies  Patient Measurements:   Per pt report - 5'1"; Wt = 80.5 kg  Vital Signs: Temp: 98.3 F (36.8 C) (02/02 1606) Temp src: Oral (02/02 1606) BP: 91/44 mmHg (02/02 1606) Pulse Rate: 83  (02/02 1606) Intake/Output from previous day:   Intake/Output from this shift:    Labs:  Basename 10/30/12 1335  WBC 13.4*  HGB 14.5  PLT 301  LABCREA --  CREATININE 1.21*   The CrCl is unknown because both a height and weight (above a minimum accepted value) are required for this calculation. No results found for this basename: VANCOTROUGH:2,VANCOPEAK:2,VANCORANDOM:2,GENTTROUGH:2,GENTPEAK:2,GENTRANDOM:2,TOBRATROUGH:2,TOBRAPEAK:2,TOBRARND:2,AMIKACINPEAK:2,AMIKACINTROU:2,AMIKACIN:2, in the last 72 hours   Microbiology: Recent Results (from the past 720 hour(s))  SURGICAL PCR SCREEN     Status: Normal   Collection Time   10/11/12 11:55 AM      Component Value Range Status Comment   MRSA, PCR NEGATIVE  NEGATIVE Final    Staphylococcus aureus NEGATIVE  NEGATIVE Final     Medical History: Past Medical History  Diagnosis Date  . Diabetes mellitus   . Hypothyroidism   . COPD (chronic obstructive pulmonary disease)   . Heart murmur     small per pt see [facility] cardiolgist  . Shortness of breath   . Sleep apnea     uses CPAP with oxygen sleep study done ~ 2 years ago in [place] at [facility]  . Anxiety   . Hypertension     stress test done in 2011, last seen heart MD in 2011  . Hypercholesteremia     pt reports that her levels are really high  . GERD (gastroesophageal reflux disease)   . Fibromyalgia   . Complication of anesthesia     pt reports with ~ 2010 they had trouble with her BP and getting it up she went to recovery room with welps on her unsure  what cause reaction    Medications:  Scheduled:    . atorvastatin  40 mg Oral q1800  .  DULoxetine  60 mg Oral BID  . insulin aspart  0-9 Units Subcutaneous TID WC  . levothyroxine  125 mcg Oral QAC breakfast  . morphine  15 mg Oral TID  . morphine  30 mg Oral TID  . niacin  1,000 mg Oral BID  . [COMPLETED] ondansetron (ZOFRAN) IV  4 mg Intravenous Once  . pantoprazole  40 mg Oral BID  . polyethylene glycol  17 g Oral Daily  . pregabalin  150 mg Oral BID  . sodium chloride  3 mL Intravenous Q12H   Assessment: 67 yo female with extensive past medical history admitted with diffuse rash with erythematous confluent eruption.  Recent hospitalization 10/16/12 for spinal surgery.  Est CrCl ~50 ml/min  Goal of Therapy:  Vancomycin trough level 10-15 mcg/ml  Plan:  1. Start vancomycin 1500 mg IV q 24 hrs 2. F/U renal function. 3. Check vancomycin trough level as appropriate.  Saory Carriero, Gwenlyn Found 10/30/2012,5:11 PM

## 2012-10-30 NOTE — ED Provider Notes (Signed)
History     CSN: [id number]  Arrival date & time 10/30/12  1150   First Patricia Stein Initiated Contact with Patient 10/30/12 1210      Chief Complaint  Patient presents with  . Rash    (Consider location/radiation/quality/duration/timing/severity/associated sxs/prior treatment) HPI  Patricia Stein is a 67 y.o. female complaining of diffuse painful and pruritic rash onset 3 days ago. Patient denies any new medication or environmental exposures. Patient had a slip and fall 5 days ago she had a lumbar fusion on January 17 by Dr. Jordan Likes. Patient endorses a subjective fever, denies chest pain, shortness of breath, abdominal pain, nausea vomiting or change in bowel or bladder habits she does report a pain in the left leg that she believes is secondary to the trauma from the fall. She denies any weakness or paresthesia. Patient has been taking Benadryl at home without relief.  PCP: Sherryll Burger in [place]  Past Medical History  Diagnosis Date  . Diabetes mellitus   . Hypothyroidism   . COPD (chronic obstructive pulmonary disease)   . Heart murmur     small per pt see [facility] cardiolgist  . Shortness of breath   . Sleep apnea     uses CPAP with oxygen sleep study done ~ 2 years ago in [place] at [facility]  . Anxiety   . Hypertension     stress test done in 2011, last seen heart Patricia Stein in 2011  . Hypercholesteremia     pt reports that her levels are really high  . GERD (gastroesophageal reflux disease)   . Fibromyalgia   . Complication of anesthesia     pt reports with ~ 2010 they had trouble with her BP and getting it up she went to recovery room with welps on her unsure  what cause reaction    Past Surgical History  Procedure Date  . Abdominal hysterectomy   . Carpal tunnel release   . Tonsillectomy   . Tubal ligation   . Back surgery     x6  . Anterior lat lumbar fusion 10/14/2012    Procedure: ANTERIOR LATERAL LUMBAR FUSION 1 LEVEL;  Surgeon: Temple Pacini, Patricia Stein;  Location: [facility];   Service: Neurosurgery;  Laterality: Left;    Family History  Problem Relation Age of Onset  . Anesthesia problems Neg Hx     History  Substance Use Topics  . Smoking status: Current Every Day Smoker -- 1.5 packs/day for 37 years    Types: Cigarettes  . Smokeless tobacco: Never Used  . Alcohol Use: No    OB History    Grav Para Term Preterm Abortions TAB SAB Ect Mult Living                  Review of Systems  Constitutional: Positive for fever.  Respiratory: Negative for shortness of breath.   Cardiovascular: Negative for chest pain.  Gastrointestinal: Negative for nausea, vomiting, abdominal pain and diarrhea.  Skin: Positive for rash.  All other systems reviewed and are negative.    Allergies  Review of patient's allergies indicates no known allergies.  Home Medications   Current Outpatient Rx  Name  Route  Sig  Dispense  Refill  . ALBUTEROL SULFATE HFA 108 (90 BASE) MCG/ACT IN AERS   Inhalation   Inhale 1 puff into the lungs every 6 (six) hours as needed. For wheezing         . ALPRAZOLAM 0.5 MG PO TABS   Oral  Take 0.5 mg by mouth 3 (three) times daily as needed. For anxiety         . AMLODIPINE BESYLATE 5 MG PO TABS   Oral   Take 5 mg by mouth daily.           Marland Kitchen DIAZEPAM 5 MG PO TABS   Oral   Take 5-10 mg by mouth every 6 (six) hours as needed. For muscle spasms         . DULOXETINE HCL 60 MG PO CPEP   Oral   Take 60 mg by mouth 2 (two) times daily.          . FUROSEMIDE 40 MG PO TABS   Oral   Take 40 mg by mouth every morning.           Marland Kitchen HYDROCODONE-ACETAMINOPHEN 5-325 MG PO TABS   Oral   Take 1-2 tablets by mouth every 4 (four) hours as needed. For pain         . LEVOTHYROXINE SODIUM 125 MCG PO TABS   Oral   Take 125 mcg by mouth daily.         Marland Kitchen LOSARTAN POTASSIUM-HCTZ 100-25 MG PO TABS   Oral   Take 1 tablet by mouth daily.           Marland Kitchen METFORMIN HCL ER (MOD) 500 MG PO TB24   Oral   Take 500 mg by mouth 3 (three)  times daily.         Marland Kitchen METOPROLOL SUCCINATE ER 100 MG PO TB24   Oral   Take 100 mg by mouth 2 (two) times daily.           . MORPHINE SULFATE ER 15 MG PO TB12   Oral   Take 15 mg by mouth 3 (three) times daily. Takes with ms contin 30mg          . MORPHINE SULFATE ER 30 MG PO TB12   Oral   Take 30 mg by mouth 3 (three) times daily. Takes with ms contin 15mg          . NIACIN ER (ANTIHYPERLIPIDEMIC) 1000 MG PO TBCR   Oral   Take 1,000 mg by mouth 2 (two) times daily.           Marland Kitchen OMEPRAZOLE 20 MG PO CPDR   Oral   Take 20 mg by mouth 2 (two) times daily.           Marland Kitchen POLYETHYLENE GLYCOL 3350 PO PACK   Oral   Take 17 g by mouth daily as needed. For constipation         . POTASSIUM CHLORIDE ER 10 MEQ PO TBCR   Oral   Take 10 mEq by mouth every morning.           Marland Kitchen PREGABALIN 150 MG PO CAPS   Oral   Take 150 mg by mouth 2 (two) times daily.           Marland Kitchen ROSUVASTATIN CALCIUM 20 MG PO TABS   Oral   Take 20 mg by mouth daily.           BP 95/59  Pulse 89  Temp 98.7 F (37.1 C) (Oral)  Resp 18  SpO2 90%  Physical Exam  Nursing note and vitals reviewed. Constitutional: She is oriented to person, place, and time. She appears well-developed and well-nourished. No distress.  HENT:  Head: Normocephalic.  Eyes: Conjunctivae normal and EOM are normal. Pupils are equal, round, and  reactive to light.  Neck: Normal range of motion. Neck supple.  Cardiovascular: Normal rate.   Pulmonary/Chest: Effort normal and breath sounds normal. No stridor. No respiratory distress. She has no wheezes. She has no rales. She exhibits no tenderness.  Abdominal: Soft. Bowel sounds are normal. She exhibits no distension and no mass. There is no tenderness. There is no rebound and no guarding.  Musculoskeletal: Normal range of motion.  Neurological: She is alert and oriented to person, place, and time.  Skin:          Diffuse rash is Warm, indurated, tender to palpation. Patient  finds it very pruritic and is constantly scratching it  Psychiatric: She has a normal mood and affect.    ED Course  Procedures (including critical care time)  Labs Reviewed  CBC WITH DIFFERENTIAL - Abnormal; Notable for the following:    WBC 13.4 (*)     Neutrophils Relative 80 (*)     Neutro Abs 10.7 (*)     All other components within normal limits  COMPREHENSIVE METABOLIC PANEL - Abnormal; Notable for the following:    Sodium 130 (*)     Chloride 91 (*)     Glucose, Bld 114 (*)     Creatinine, Ser 1.21 (*)     Albumin 3.3 (*)     GFR calc non Af Amer 46 (*)     GFR calc Af Amer 54 (*)     All other components within normal limits  URINALYSIS, ROUTINE W REFLEX MICROSCOPIC - Abnormal; Notable for the following:    Color, Urine AMBER (*)  BIOCHEMICALS MAY BE AFFECTED BY COLOR   APPearance CLOUDY (*)     Bilirubin Urine MODERATE (*)     Ketones, ur 15 (*)     Leukocytes, UA TRACE (*)     All other components within normal limits  URINE MICROSCOPIC-ADD ON - Abnormal; Notable for the following:    Casts HYALINE CASTS (*)  GRANULAR CAST   All other components within normal limits  CULTURE, BLOOD (ROUTINE X 2)  CULTURE, BLOOD (ROUTINE X 2)  CBC  COMPREHENSIVE METABOLIC PANEL  HEMOGLOBIN A1C  CULTURE, BLOOD (ROUTINE X 2)  CULTURE, BLOOD (ROUTINE X 2)  PROCALCITONIN   Dg Lumbar Spine 2-3 Views  10/30/2012  *RADIOLOGY REPORT*  Clinical Data: 67 year old female status post fall.  Back surgery 2 weeks ago.  LUMBAR SPINE - 2-3 VIEW  Comparison: Intraoperative radiographs 10/14/2012 and earlier.  Findings: Sequelae of left lateral and interbody fusion at L2-L3 again noted and superimposed on the preexisting L3-L4 and L4-L5 transpedicular and interbody fusion.  Stable vertebral height and alignment.  Visible lower thoracic levels appears stable.  Sacral ala and SI joints appear stable.  Calcified atherosclerosis.  IMPRESSION: Stable postoperative appearance of the lumbar spine.    Original Report Authenticated By: Erskine Speed, M.D.    Mr Lumbar Spine W Wo Contrast  10/30/2012  *RADIOLOGY REPORT*  Clinical Data: Multiple prior lumbar spine operations.  Most recent operation 2 weeks ago.  Fall.  New left leg pain.  MRI LUMBAR SPINE WITHOUT AND WITH CONTRAST  Technique:  Multiplanar and multiecho pulse sequences of the lumbar spine were obtained without and with intravenous contrast.  Contrast: 15mL MULTIHANCE GADOBENATE DIMEGLUMINE 529 MG/ML IV SOLN  Comparison: MRI 05/08/2011.CT myelogram 11/15/2011  Findings: Axial images are degraded by motion artifact, particularly the Postcontrast T1-weighted images.  Straightening of the normal lumbar lordosis.  Posterior rod and pedicle screw fixation extends  from L3 through L5.  Ankylosis across L5-S1.  L3- L4 pedicle screws are present on the prior comparison. Paraspinal soft tissues appear similar.  Transverse lumbar interbody fusion is present at L2-L3.  There is diffuse swelling and edema of the right psoas muscle with post-gadolinium enhancement.  This may be postoperative.  Infection cannot be excluded.  Similar postoperative changes are present in the left psoas muscle.  There is a focal 2 cm left psoas muscle hematoma.  There also appears to be another small hematoma around the left lateral plate and screw fixation apparatus at L2-L3.  The right psoas the right psoas muscle demonstrates a 16 mm x 15 mm nonenhancing heterogeneous fluid collection which may represent hematoma or abscess.  There is surrounding phlegmon in the right psoas.  Lower thoracic levels appear unchanged.  L1-L2:  Mild disc degeneration with shallow broad-based bulging. No stenosis.  The central canal and foramina patent.  L2-L3:  Interval discectomy.  Left lateral plate and screw fixation.  L2 laminectomy.  Configuration of the central canal appears similar to the prior myelogram.   On axial images, there appears to be marked swelling of the left L2 foramen in the lateral  aspect of the foramen however this could be a small disc fragment. Evaluation is difficult in this time frame postoperatively.  L3-L4:  Wide posterior decompression.  Solid fusion.  L4-L5:  Postoperative changes of discectomy.  No bridging bone is seen.  Wide posterior decompression.  L5-S1:  Wide posterior decompression with solid fusion.  IMPRESSION:  1.  No epidural hematoma or epidural process producing neural compression. 2.  Interval postoperative changes of transverse lumbar interbody fusion at L2-L3.  Left lateral plate and screw fixation. Collections are present in the left psoas muscle with signal characteristics most compatible with hematoma.  Superimposed infection cannot be excluded.  Diffuse phlegmon in the right psoas muscle may be postoperative.  Phlegmon in the right psoas muscle with heterogeneous fluid collection measuring 16 mm x 15 mm.  This may represent postoperative hematoma, seroma however abscess cannot be excluded. 3.  Small mass in the left neural foramen.  This may represent a swollen nerve root or small disc fragment.  This is poorly evaluated because of motion artifact on the post-gadolinium images. 4.  L3-L5 posterior lumbar interbody fusion hardware.  Solid fusion at L3-L4 and L5-S1.   Original Report Authenticated By: Andreas Newport, M.D.    Dg Pelvis Portable  10/30/2012  *RADIOLOGY REPORT*  Clinical Data: Fall.  Back pain.  PORTABLE PELVIS  Comparison: None.  Findings: L3-L5 posterior lumbar interbody fusion is visualized. Pelvic rings are intact.  Both hips are externally rotated, right greater than left.  The pelvic rings appear intact.  No acute osseous abnormality or fracture.  IMPRESSION: No acute osseous abnormality.  No visualized hardware complication of lumbar fusion hardware.   Original Report Authenticated By: Andreas Newport, M.D.      1. Rash   2. Back pain   3. COPD (chronic obstructive pulmonary disease)   4. Diabetes mellitus       MDM   CALLEY DRENNING is a 67 y.o. female complaining of diffuse scattered painful) to crash worsening over the course of 3 days associated with subjective fever. Patient recently had a lumbar spinal fusion.   Rash appears indurated and warm. Suspect infectious process of cellulitis. Surgical scar is spared it does not have any issues and is well healing. Blood cultures drawn, patient has a mild leukocytosis of  13.4.  This is a shared visit with attending Dr. Oletta Lamas.   Filed Vitals:   10/30/12 1212 10/30/12 1502 10/30/12 1606 10/30/12 1851  BP: 100/62  91/44 95/59  Pulse: 91  83 89  Temp: 98.3 F (36.8 C) 98.3 F (36.8 C) 98.3 F (36.8 C) 98.7 F (37.1 C)  TempSrc: Oral  Oral Oral  Resp: 18  16 18   SpO2: 99%  95% 90%     Patient will be admitted to the care of Dr. Ethelle Lyon.        Wynetta Emery, PA-C 10/30/12 2027

## 2012-10-30 NOTE — ED Notes (Signed)
Pt presents to department for evaluation of generalized rash. States she fell several days ago at home and now has rash to bilateral arms, legs and lower abdominal region. States area itches and is painful. 5/10 discomfort at the time. Also state she is sore all over body from fall. She is alert and oriented x4.

## 2012-10-30 NOTE — Consult Note (Signed)
PCP:  Kirstie Peri, MD  Chief Complaint:  Diffuse itchy eruption for a few days.  HPI:  This is a 67 year old female, with known history of DM-2, GERD, hypothyroidism, dyslipidemia, COPD, OSA on nocturnal CPAP, anxiety, fibromyalgia, HTN, s/p abdominal hysterectomy, s/p tonsillectomy, s/p carpal tunnel release, s/p multiple back surgeries (6 in total), most recently, Left L2-3 anterior lateral retroperitoneal diskectomy and interbody fusion on 10/14/12. Patient was hospitalized 10/14/12-10/16/12. Since discharge, she had been ambulating with a walker, although she didhave some back pain. She had developed a local erythematous reaction to her wound dressing, with blistering, and had applied an antibiotic ointment to the area, with resolution of blisters, but still has residual redness. On 10/25/12 , she slipped and fell from her front porch, while ambulating without her walker, and landed on her right side, thankfully missing the step, and insists she did not hit her head. Since then, she has had significant difficulty ambulating, and has right-sided back pain. On 10/27/12, she developed redness in her medial upper thighs, associated with soreness, and ocassional itching. The affected areas are indurated and circumscribed, and have spread to involve the lower abdomen, flanks, right axilla, upper chest, as well as back, in an uneven, asymmetrical distribution. Redness and swelling in the right malar region appeared on 10/29/12, and has also spread to involve her right ear. She has had subjective fevers and chills. Because of worsening symptoms, her husband brought her to ED today. Patient denies any new medication or environmental exposures.  Allergies:  No Known Allergies  Past Medical History   Diagnosis  Date   .  Diabetes mellitus    .  Hypothyroidism    .  COPD (chronic obstructive pulmonary disease)    .  Heart murmur      small per pt see [facility] cardiolg[facility]   .  Shortness of breath    .  Sleep apnea       uses CPAP with oxygen sleep study done ~ 2 years ago in [place] at [facility]   .  Anxiety    .  Hypertension      stress test done in 2011, last seen heart MD in 2011   .  Hypercholesteremia      pt reports that her levels are really high   .  GERD (gastroesophageal reflux disease)    .  Fibromyalgia    .  Complication of anesthesia      pt reports with ~ 2010 they had trouble with her BP and getting it up she went to recovery room with welps on her unsure what cause reaction    Past Surgical History   Procedure  Date   .  Abdominal hysterectomy    .  Carpal tunnel release    .  Tonsillectomy    .  Tubal ligation    .  Back surgery      x6   .  Anterior lat lumbar fusion  10/14/2012     Procedure: ANTERIOR LATERAL LUMBAR FUSION 1 LEVEL; Surgeon: Temple Pacini, MD; Location: [facility]; Service: Neurosurgery; Laterality: Left;    Prior to Admission medications   Medication  Sig  Start Date  End Date  Taking?  Authorizing Provider   albuterol (PROVENTIL HFA;VENTOLIN HFA) 108 (90 BASE) MCG/ACT inhaler  Inhale 1 puff into the lungs every 6 (six) hours as needed. For wheezing    Yes  Historical Provider, MD   ALPRAZolam Prudy Feeler) 0.5 MG tablet  Take  0.5 mg by mouth 3 (three) times daily as needed. For anxiety    Yes  Historical Provider, MD   amLODipine (NORVASC) 5 MG tablet  Take 5 mg by mouth daily.    Yes  Historical Provider, MD   diazepam (VALIUM) 5 MG tablet  Take 5-10 mg by mouth every 6 (six) hours as needed. For muscle spasms  10/16/12   Yes  Tia Alert, MD   DULoxetine (CYMBALTA) 60 MG capsule  Take 60 mg by mouth 2 (two) times daily.    Yes  Historical Provider, MD   furosemide (LASIX) 40 MG tablet  Take 40 mg by mouth every morning.    Yes  Historical Provider, MD   HYDROcodone-acetaminophen (NORCO/VICODIN) 5-325 MG per tablet  Take 1-2 tablets by mouth every 4 (four) hours as needed. For pain  10/16/12   Yes  Tia Alert, MD   levothyroxine (SYNTHROID, LEVOTHROID) 125  MCG tablet  Take 125 mcg by mouth daily.    Yes  Historical Provider, MD   losartan-hydrochlorothiazide (HYZAAR) 100-25 MG per tablet  Take 1 tablet by mouth daily.    Yes  Historical Provider, MD   metFORMIN (GLUMETZA) 500 MG (MOD) 24 hr tablet  Take 500 mg by mouth 3 (three) times daily.    Yes  Historical Provider, MD   metoprolol (TOPROL-XL) 100 MG 24 hr tablet  Take 100 mg by mouth 2 (two) times daily.    Yes  Historical Provider, MD   morphine (MS CONTIN) 15 MG 12 hr tablet  Take 15 mg by mouth 3 (three) times daily. Takes with ms contin 30mg     Yes  Historical Provider, MD   morphine (MS CONTIN) 30 MG 12 hr tablet  Take 30 mg by mouth 3 (three) times daily. Takes with ms contin 15mg     Yes  Historical Provider, MD   niacin (NIASPAN) 1000 MG CR tablet  Take 1,000 mg by mouth 2 (two) times daily.    Yes  Historical Provider, MD   omeprazole (PRILOSEC) 20 MG capsule  Take 20 mg by mouth 2 (two) times daily.    Yes  Historical Provider, MD   polyethylene glycol (MIRALAX / GLYCOLAX) packet  Take 17 g by mouth daily as needed. For constipation    Yes  Historical Provider, MD   potassium chloride (K-DUR) 10 MEQ tablet  Take 10 mEq by mouth every morning.    Yes  Historical Provider, MD   pregabalin (LYRICA) 150 MG capsule  Take 150 mg by mouth 2 (two) times daily.    Yes  Historical Provider, MD   rosuvastatin (CRESTOR) 20 MG tablet  Take 20 mg by mouth daily.    Yes  Historical Provider, MD   Social History:  Patient reports that she has been smoking Cigarettes. She has a 55.5 pack-year smoking history. She has never used smokeless tobacco. She reports that she does not drink alcohol or use illicit drugs.  Family History   Problem  Relation  Age of Onset   .  Anesthesia problems  Neg Hx     Review of Systems:  As per HPI and chief complaint. Patent denies fatigue, appetite is somewhat diminished, but she has no weight loss, headache, blurred vision, difficulty in speaking, dysphagia, chest pain,  cough, shortness of breath, orthopnea, paroxysmal nocturnal dyspnea, nausea, diaphoresis, abdominal pain, vomiting, diarrhea, belching, heartburn, hematemesis, melena, dysuria, nocturia, urinary frequency, hematochezia. The rest of the systems review is negative.  Physical  Exam:  General: Patient does not appear to be in obvious acute distress. Alert, communicative, fully oriented, talking in complete sentences, not short of breath at rest. Does not look toxic.  SKIN: Erythematous, indurated and circumscribed areas of skin, involving both upper thighs, the surgical site tin her right lower back, and the lower abdomen, flanks, right axilla, upper chest, as well back, right malar region and right ear pinna in an uneven, asymmetrical distribution. HEENT: No clinical pallor, no jaundice, no conjunctival injection or discharge. Throat is clear. Hydration appears fair.  NECK: Supple, JVP not seen, no carotid bruits, no palpable lymphadenopathy, no palpable goiter.  CHEST: Clinically clear to auscultation, no wheezes, no crackles.  HEART: Sounds 1 and 2 heard, normal, regular, no murmurs.  ABDOMEN: Obese, soft, non-tender, no palpable organomegaly, no palpable masses, normal bowel sounds.  GENITALIA: Not examined.  LOWER EXTREMITIES: No pitting edema, palpable peripheral pulses.  MUSCULOSKELETAL SYSTEM: Generalized osteoarthritic changes, otherwise, normal. Has pain in the back on straight leg raising bilaterally, right>left.  CENTRAL NERVOUS SYSTEM: No focal neurologic deficit on gross examination.  Labs on Admission:  Results for orders placed during the hospital encounter of 10/30/12 (from the past 48 hour(s))   CBC WITH DIFFERENTIAL Status: Abnormal    Collection Time    10/30/12 1:35 PM   Component  Value  Range  Comment    WBC  13.4 (*)  4.0 - 10.5 K/uL     RBC  4.58  3.87 - 5.11 MIL/uL     Hemoglobin  14.5  12.0 - 15.0 g/dL     HCT  40.9  81.1 - 91.4 %     MCV  92.8  78.0 - 100.0 fL     MCH   31.7  26.0 - 34.0 pg     MCHC  34.1  30.0 - 36.0 g/dL     RDW  78.2  95.6 - 21.3 %     Platelets  301  150 - 400 K/uL     Neutrophils Relative  80 (*)  43 - 77 %     Neutro Abs  10.7 (*)  1.7 - 7.7 K/uL     Lymphocytes Relative  16  12 - 46 %     Lymphs Abs  2.1  0.7 - 4.0 K/uL     Monocytes Relative  3  3 - 12 %     Monocytes Absolute  0.5  0.1 - 1.0 K/uL     Eosinophils Relative  1  0 - 5 %     Eosinophils Absolute  0.1  0.0 - 0.7 K/uL     Basophils Relative  0  0 - 1 %     Basophils Absolute  0.0  0.0 - 0.1 K/uL    COMPREHENSIVE METABOLIC PANEL Status: Abnormal    Collection Time    10/30/12 1:35 PM   Component  Value  Range  Comment    Sodium  130 (*)  135 - 145 mEq/L     Potassium  3.6  3.5 - 5.1 mEq/L     Chloride  91 (*)  96 - 112 mEq/L     CO2  26  19 - 32 mEq/L     Glucose, Bld  114 (*)  70 - 99 mg/dL     BUN  22  6 - 23 mg/dL     Creatinine, Ser  0.86 (*)  0.50 - 1.10 mg/dL     Calcium  9.4  8.4 -  10.5 mg/dL     Total Protein  7.4  6.0 - 8.3 g/dL     Albumin  3.3 (*)  3.5 - 5.2 g/dL     AST  17  0 - 37 U/L     ALT  9  0 - 35 U/L     Alkaline Phosphatase  100  39 - 117 U/L     Total Bilirubin  0.4  0.3 - 1.2 mg/dL     GFR calc non Af Amer  46 (*)  >90 mL/min     GFR calc Af Amer  54 (*)  >90 mL/min    URINALYSIS, ROUTINE W REFLEX MICROSCOPIC Status: Abnormal    Collection Time    10/30/12 1:36 PM   Component  Value  Range  Comment    Color, Urine  AMBER (*)  YELLOW  BIOCHEMICALS MAY BE AFFECTED BY COLOR    APPearance  CLOUDY (*)  CLEAR     Specific Gravity, Urine  1.028  1.005 - 1.030     pH  5.0  5.0 - 8.0     Glucose, UA  NEGATIVE  NEGATIVE mg/dL     Hgb urine dipstick  NEGATIVE  NEGATIVE     Bilirubin Urine  MODERATE (*)  NEGATIVE     Ketones, ur  15 (*)  NEGATIVE mg/dL     Protein, ur  NEGATIVE  NEGATIVE mg/dL     Urobilinogen, UA  1.0  0.0 - 1.0 mg/dL     Nitrite  NEGATIVE  NEGATIVE     Leukocytes, UA  TRACE (*)  NEGATIVE    URINE MICROSCOPIC-ADD ON Status:  Abnormal    Collection Time    10/30/12 1:36 PM   Component  Value  Range  Comment    Squamous Epithelial / LPF  RARE  RARE     WBC, UA  0-2  <3 WBC/hpf     Casts  HYALINE CASTS (*)  NEGATIVE  GRANULAR CAST    Radiological Exams on Admission:  No results found.  Assessment/Plan  Active Problems:  1. Rash: Patient presented with an erythematous confluent eruption, which started initially in the bilateral upper thighs, and has progressed to involve trunk, face and right ear, since 10/27/12, although it appears that she had a blistering erythematous eruption in her surgical site at the time of discharge from her latest hospitalization on 10/16/12. She had had chills, subjective fever, and has a neutrophilic leukocytosis at presentation, of 13.4. As she has had no known exposure to external allergens or new medication, and no antecedent respiratory tract illness, index of suspicion is high for an infective etiology, particularly, as affected areas are brawny, indurated, raised and sharply circumscribed, reminiscent of erysipelas. Will send off blood cultures, check Procalcitonin levels, commence broad spectrum antibiotic coverage with Vancomycin and Rocephin. Have requested ID consultation and discussed with Dr Judyann Munson. Dr Cliffton Asters will see patient on 10/31/12. .  2. Dehydration: BP is somewhat borderlone at this time, with SBP 100-91. Creatinine is 1.21, against a known baseline creatinine of 0.48 on 10/11/12. This may be due to dehydration, but one has to entertain the possibility of early sepsis. Will manage with iv fluids, and hold pre-admission anti-hypertensives and diuretics for now.   I have reviewed plain radiographs of the lumbar spine as well as MRI of the lumbar spine.  These show well positioned interbody graft and lateral plate at the L 2-3 level with some postoperative fluid on the left,  suggestive of hematoma.  There also appears to be fluid on the right in the psoas muscle.  This  does not need to be drained and there does not appear to be a frank abscess in any location imaged.  I agree with Dr. Brien Few to treat with IV antibiotics (Vancomycin and Rocephin) and to follow patient's WBC and cutaneous infection.  I have informed Dr. Jordan Likes of patient's admission and he will see her in the morning and review her progress.   Danae Orleans. Venetia Maxon, MD

## 2012-10-31 DIAGNOSIS — L509 Urticaria, unspecified: Secondary | ICD-10-CM

## 2012-10-31 LAB — GLUCOSE, CAPILLARY
Glucose-Capillary: 100 mg/dL — ABNORMAL HIGH (ref 70–99)
Glucose-Capillary: 222 mg/dL — ABNORMAL HIGH (ref 70–99)

## 2012-10-31 LAB — COMPREHENSIVE METABOLIC PANEL
ALT: 8 U/L (ref 0–35)
Alkaline Phosphatase: 85 U/L (ref 39–117)
CO2: 26 mEq/L (ref 19–32)
Calcium: 9.1 mg/dL (ref 8.4–10.5)
GFR calc Af Amer: >90 mL/min (ref >90)
GFR calc non Af Amer: >90 mL/min (ref >90)
Glucose, Bld: 138 mg/dL — ABNORMAL HIGH (ref 70–99)
Potassium: 3.9 mEq/L (ref 3.5–5.1)
Sodium: 132 mEq/L — ABNORMAL LOW (ref 135–145)

## 2012-10-31 LAB — CBC
Hemoglobin: 13.4 g/dL (ref 12.0–15.0)
MCH: 32.1 pg (ref 26.0–34.0)
RBC: 4.18 MIL/uL (ref 3.87–5.11)

## 2012-10-31 MED ORDER — DIPHENHYDRAMINE HCL 25 MG PO CAPS
25.0000 mg | ORAL_CAPSULE | Freq: Four times a day (QID) | ORAL | Status: DC
  Administered 2012-10-31 – 2012-11-01 (×5): 25 mg via ORAL
  Filled 2012-10-31 (×9): qty 1

## 2012-10-31 MED ORDER — LORATADINE 10 MG PO TABS
10.0000 mg | ORAL_TABLET | Freq: Every day | ORAL | Status: DC
  Administered 2012-10-31 – 2012-11-02 (×3): 10 mg via ORAL
  Filled 2012-10-31 (×3): qty 1

## 2012-10-31 MED ORDER — HYDROCORTISONE 1 % EX LOTN: TOPICAL_LOTION | Freq: Four times a day (QID) | CUTANEOUS | Status: DC | PRN

## 2012-10-31 MED ORDER — FAMOTIDINE 40 MG PO TABS
40.0000 mg | ORAL_TABLET | Freq: Two times a day (BID) | ORAL | Status: DC
  Administered 2012-10-31 – 2012-11-02 (×5): 40 mg via ORAL
  Filled 2012-10-31 (×7): qty 1

## 2012-10-31 MED ORDER — METHYLPREDNISOLONE SODIUM SUCC 125 MG IJ SOLR
60.0000 mg | Freq: Once | INTRAMUSCULAR | Status: AC
  Administered 2012-10-31: 60 mg via INTRAVENOUS
  Filled 2012-10-31: qty 0.96

## 2012-10-31 MED ORDER — SODIUM CHLORIDE 0.9 % IV SOLN
INTRAVENOUS | Status: DC
  Administered 2012-10-31: 75 mL/h via INTRAVENOUS
  Administered 2012-11-01: 07:00:00 via INTRAVENOUS

## 2012-10-31 NOTE — Progress Notes (Signed)
Patient admitted with a significant systemic allergic reaction of unknown cause. Patient improved today with frequent Benadryl. With regard to her lumbar surgery she is progressing reasonably well. Back pain is controlled. She still having some right groin and hip discomfort but this is likely secondary to some psoas muscle irritation. I reviewed her x-rays as well as her MRI scan. These demonstrate postoperative changes. I agree that I see no obvious evidence of infection. At this point I'll defer per medical team with regard to treatment of her allergic reaction. I will continue to follow her while she is in the hospital.

## 2012-10-31 NOTE — Consult Note (Signed)
[facility]    Date of Admission:  10/30/2012           Day 1 vancomycin        Day 1 ceftriaxone       Reason for Consult: Rash; rule out wound infection    Referring Physician: Dr. Isidor Holts  Active Problems:  Urticaria  Back pain  Diabetes mellitus  HTN (hypertension)  Hypothyroidism  GERD (gastroesophageal reflux disease)  COPD (chronic obstructive pulmonary disease)  OSA on CPAP      . atorvastatin  40 mg Oral q1800  . cefTRIAXone (ROCEPHIN)  IV  1 g Intravenous Q24H  . DULoxetine  60 mg Oral BID  . insulin aspart  0-9 Units Subcutaneous TID WC  . levothyroxine  125 mcg Oral QAC breakfast  . morphine  45 mg Oral Q8H  . niacin  1,000 mg Oral BID  . pantoprazole  40 mg Oral BID  . polyethylene glycol  17 g Oral Daily  . pregabalin  150 mg Oral BID  . sodium chloride  500 mL Intravenous Once  . sodium chloride  3 mL Intravenous Q12H  . vancomycin  1,500 mg Intravenous Q24H    Recommendations: 1. Discontinue vancomycin and ceftriaxone 2. CBC with differential  3. Antihistamines  Assessment: Ms. Huseby has developed diffuse urticaria/angioedema of unknown cause. This does not have the appearance of infection and I do not suspect postoperative lumbar infection. I will stop her empiric antibiotics and treat her with antihistamines.    HPI: Patricia Stein is a 67 y.o. female who underwent lumbar decompression and fusion on January 17. She was discharged on January 19 and she and her family noted some reaction to the tape used on her lumbar incisions that evening. There is some redness and blistering with periodic drainage of clear yellow fluid. 4 days ago she began noting redness, swelling and intense pruritus of her inner thighs. Since that time she has had progression of indurated, red plaques with intense pruritus progressing onto her abdomen, chest and face. She has minimal back pain and no documented fever. Other than the recent  reaction to tape she does not know of any new exposures or medications. She has had mild reactions to tape in the past but never had a diffuse rash like this before.   Review of Systems: Pertinent items are noted in HPI.  Past Medical History  Diagnosis Date  . Diabetes mellitus   . Hypothyroidism   . COPD (chronic obstructive pulmonary disease)   . Heart murmur     small per pt see [facility] cardiolgist  . Shortness of breath   . Sleep apnea     uses CPAP with oxygen sleep study done ~ 2 years ago in [place] at [facility]  . Anxiety   . Hypertension     stress test done in 2011, last seen heart MD in 2011  . Hypercholesteremia     pt reports that her levels are really high  . GERD (gastroesophageal reflux disease)   . Fibromyalgia   . Complication of anesthesia     pt reports with ~ 2010 they had trouble with her BP and getting it up she went to recovery room with welps on her unsure  what cause reaction    History  Substance Use Topics  . Smoking status: Current Every Day Smoker -- 1.5 packs/day for 37 years    Types: Cigarettes  . Smokeless tobacco: Never  Used  . Alcohol Use: No    Family History  Problem Relation Age of Onset  . Anesthesia problems Neg Hx    No Known Allergies  OBJECTIVE: Blood pressure 101/54, pulse 96, temperature 98 F (36.7 C), temperature source Oral, resp. rate 18, height 5' (1.524 m), weight 82.9 kg (182 lb 12.2 oz), SpO2 96.00%. General: She is alert and comfortable except for diffuse itching  Skin: She is well circumscribed erythema and swelling in multiple locations, including her inner thighs, lower back corresponding to her left lateral incisions which are healing nicely, under the tape for her IV, under her right eye, left forehead and right earlobe. Her median lumbar incision is healing nicely.  Lungs: Clear  Cor:  Regular S1 and S2 no murmurs Abdomen:  soft nontender   Microbiology: Recent Results (from the past 240 hour(s))    CULTURE, BLOOD (ROUTINE X 2)     Status: Normal (Preliminary result)   Collection Time   10/30/12  1:20 PM      Component Value Range Status Comment   Specimen Description BLOOD LEFT ARM   Final    Special Requests BOTTLES DRAWN AEROBIC AND ANAEROBIC 10CC   Final    Culture  Setup Time 10/30/2012 21:00   Final    Culture     Final    Value:        BLOOD CULTURE RECEIVED NO GROWTH TO DATE CULTURE WILL BE HELD FOR 5 DAYS BEFORE ISSUING A FINAL NEGATIVE REPORT   Report Status PENDING   Incomplete   CULTURE, BLOOD (ROUTINE X 2)     Status: Normal (Preliminary result)   Collection Time   10/30/12  1:30 PM      Component Value Range Status Comment   Specimen Description BLOOD RIGHT HAND   Final    Special Requests BOTTLES DRAWN AEROBIC ONLY 2.5CC   Final    Culture  Setup Time 10/30/2012 21:00   Final    Culture     Final    Value:        BLOOD CULTURE RECEIVED NO GROWTH TO DATE CULTURE WILL BE HELD FOR 5 DAYS BEFORE ISSUING A FINAL NEGATIVE REPORT   Report Status PENDING   Incomplete     Cliffton Asters, MD [facility] [phone] pager   [phone] cell 10/31/2012, 10:50 AM

## 2012-10-31 NOTE — Evaluation (Signed)
Physical Therapy Evaluation Patient Details Name: Patricia Stein MRN: [id number] DOB: 09/15/1946 Today's Date: 10/31/2012 Time: 7829-5621 PT Time Calculation (min): 27 min  PT Assessment / Plan / Recommendation Clinical Impression  Pt is 67 y/o female Left L2-3 anterior lateral retroperitoneal diskectomy and interbody fusion on 10/14/12. Patient was hospitalized 10/14/12-10/16/12. On 10/25/12 , she slipped and fell from her front porch, while ambulating without her walker, and landed on her right side. Since then, she has had significant difficulty ambulating, and has right-sided back pain. On 10/27/12, she developed redness in her medial upper thighs, associated with soreness, and ocassional itching.  Pt c/o right sharp shooting pain on right LE with ambulation. Pt will benefit from acute PT services to improve overall mobility and prepare for safe d/c home.    PT Assessment  Patient needs continued PT services    Follow Up Recommendations  No PT follow up;Supervision for mobility/OOB    Equipment Recommendations  None recommended by PT    Frequency Min 3X/week    Precautions / Restrictions Precautions Precautions: Back Precaution Booklet Issued: Yes (comment) Precaution Comments: pt able to verbalize 3/3 back precuations. Cues to avoid twisting Required Braces or Orthoses: Spinal Brace Restrictions Weight Bearing Restrictions: No   Pertinent Vitals/Pain C/o right sharp shooting pain in ambulation but does not rate      Mobility  Bed Mobility Bed Mobility: Not assessed (pt sitting in chair when entering room) Transfers Transfers: Sit to Stand;Stand to Sit Sit to Stand: 4: Min assist;From chair/3-in-1;From toilet Stand to Sit: 4: Min assist;To chair/3-in-1;To toilet Details for Transfer Assistance: (A) to initiate and slowly descend from commode with max cues for hand placement.  Pt tends to keep hands on RW. Ambulation/Gait Ambulation/Gait Assistance: 4: Min guard Ambulation  Distance (Feet): 125 Feet Assistive device: Rolling walker Ambulation/Gait Assistance Details: Minguard for safety with max cues for RW placement and body position within RW. Gait Pattern: Step-through pattern;Decreased trunk rotation Stairs: No     PT Diagnosis: Difficulty walking;Acute pain  PT Problem List: Decreased activity tolerance;Decreased knowledge of use of DME;Decreased mobility;Decreased balance;Decreased safety awareness;Decreased knowledge of precautions;Pain PT Treatment Interventions: DME instruction;Gait training;Stair training;Functional mobility training;Therapeutic activities;Patient/family education   PT Goals Acute Rehab PT Goals PT Goal Formulation: With patient Time For Goal Achievement: 11/07/12 Potential to Achieve Goals: Good Pt will go Supine/Side to Sit: with modified independence PT Goal: Supine/Side to Sit - Progress: Goal set today Pt will go Sit to Supine/Side: with modified independence PT Goal: Sit to Supine/Side - Progress: Goal set today Pt will go Sit to Stand: with modified independence PT Goal: Sit to Stand - Progress: Goal set today Pt will go Stand to Sit: with modified independence PT Goal: Stand to Sit - Progress: Goal set today Pt will Transfer Bed to Chair/Chair to Bed: with modified independence PT Transfer Goal: Bed to Chair/Chair to Bed - Progress: Goal set today Pt will Ambulate: >150 feet;with modified independence;with least restrictive assistive device PT Goal: Ambulate - Progress: Goal set today Pt will Go Up / Down Stairs: 1-2 stairs;with modified independence PT Goal: Up/Down Stairs - Progress: Goal set today  Visit Information  Last PT Received On: 10/31/12 Assistance Needed: +1 PT/OT Co-Evaluation/Treatment: Yes    Subjective Data  Subjective: "I'm itching all over." Patient Stated Goal: To get better and return home   Prior Functioning  Home Living Lives With: Spouse Available Help at Discharge: Family;Other  (Comment) (works during day home alone during day time) Type of  Home: House Home Access: Stairs to enter [employer] of Steps: 2 Entrance Stairs-Rails: Right;Can reach both;Left Home Layout: One level Bathroom Shower/Tub: Walk-in Contractor: Handicapped height Bathroom Accessibility: Yes How Accessible: Accessible via walker Home Adaptive Equipment: Shower chair with back;Walker - rolling Prior Function Level of Independence: Independent Able to Take Stairs?: Yes Driving: Yes (pt last drove just prior to surgery) Vocation: Retired Musician: No difficulties Dominant Hand: Right    Cognition  Cognition Overall Cognitive Status: Appears within functional limits for tasks assessed/performed Arousal/Alertness: Awake/alert Orientation Level: Appears intact for tasks assessed Behavior During Session: [facility] for tasks performed    Extremity/Trunk Assessment Right Upper Extremity Assessment RUE ROM/Strength/Tone: Within functional levels RUE Sensation: WFL - Light Touch RUE Coordination: WFL - gross/fine motor Left Upper Extremity Assessment LUE ROM/Strength/Tone: Within functional levels LUE Sensation: WFL - Light Touch LUE Coordination: WFL - gross/fine motor Right Lower Extremity Assessment RLE ROM/Strength/Tone: Within functional levels RLE Sensation: Deficits RLE Sensation Deficits: c/o sharp shooting pain from groin to knee with ambulation Left Lower Extremity Assessment LLE ROM/Strength/Tone: Within functional levels LLE Sensation: WFL - Light Touch   Balance    End of Session PT - End of Session Equipment Utilized During Treatment: Gait belt (Pt did not bring back brace.  Husband to bring it) Activity Tolerance: Patient tolerated treatment well Patient left: in chair;with call bell/phone within reach Nurse Communication: Mobility status  GP     Maricsa Sammons 10/31/2012, 11:25 AM  Jake Shark, PT DPT [phone]

## 2012-10-31 NOTE — Evaluation (Signed)
Occupational Therapy Evaluation Patient Details Name: Patricia Stein MRN: [id number] DOB: 08/19/1946 Today's Date: 10/31/2012 Time: 4098-1191 OT Time Calculation (min): 28 min  OT Assessment / Plan / Recommendation Clinical Impression  67 yo female admitted s/p fall with recent ALIF L2-3 with aspen back brace. Ot to follow acutely fro balance with ADLS. Recommend HHOT for d/c planning    OT Assessment  Patient needs continued OT Services    Follow Up Recommendations  Home health OT    Barriers to Discharge      Equipment Recommendations       Recommendations for Other Services    Frequency  Min 2X/week    Precautions / Restrictions Precautions Precautions: Back Precaution Booklet Issued: Yes (comment) Precaution Comments: pt able to verbalize 3/3 back precuations. Cues to avoid twisting Required Braces or Orthoses: Spinal Brace Spinal Brace:  (no brace present, "i forgot it")   Pertinent Vitals/Pain Pain in RT LE intermittently     ADL  Eating/Feeding: Modified independent Where Assessed - Eating/Feeding: Chair Grooming: Wash/dry hands;Supervision/safety Where Assessed - Grooming: Unsupported standing Lower Body Dressing: Minimal assistance Where Assessed - Lower Body Dressing: Supported sitting Toilet Transfer: Minimal assistance Toilet Transfer Method: Sit to Barista: Regular height toilet Toileting - Clothing Manipulation and Hygiene: Min guard Where Assessed - Toileting Clothing Manipulation and Hygiene: Sit to stand from 3-in-1 or toilet Equipment Used: Gait belt;Rolling walker (no back brace present in room) Transfers/Ambulation Related to ADLs: Pt completed ambulationg min (A) with rw. Pt educated to have husband bring back brace this PM. Pt states "he was wanting me to come and I just forgot it when [facility]" ADL Comments: Pt recognizes therapist from previous admission. pt describes fall as occurring on front porch due to a  rug present. Pt states rash staring at Rt groining and spreading all over. Pt reports pain in Rt LE that was not present previously. Ot to follow acutely for ADLS and safety. Pt recalled 3 out 3 precuations with increased time.     OT Diagnosis: Generalized weakness;Acute pain  OT Problem List: Decreased strength;Decreased activity tolerance;Impaired balance (sitting and/or standing);Decreased safety awareness;Decreased knowledge of use of DME or AE;Decreased knowledge of precautions;Pain OT Treatment Interventions: Self-care/ADL training;DME and/or AE instruction;Therapeutic activities;Patient/family education;Balance training   OT Goals Acute Rehab OT Goals OT Goal Formulation: With patient Time For Goal Achievement: 11/14/12 Potential to Achieve Goals: Good ADL Goals Pt Will Perform Grooming: with modified independence;Standing at sink ADL Goal: Grooming - Progress: Goal set today Pt Will Perform Lower Body Bathing: with modified independence;Standing at sink ADL Goal: Lower Body Bathing - Progress: Goal set today Pt Will Perform Lower Body Dressing: with modified independence;Sit to stand from chair ADL Goal: Lower Body Dressing - Progress: Goal set today Pt Will Transfer to Toilet: with modified independence;Ambulation;Regular height toilet ADL Goal: Toilet Transfer - Progress: Goal set today Miscellaneous OT Goals Miscellaneous OT Goal #1: Pt will don /doff brace MoD I as precursor to basic transfer. OT Goal: Miscellaneous Goal #1 - Progress: Goal set today  Visit Information  Last OT Received On: 10/31/12 Assistance Needed: +1 PT/OT Co-Evaluation/Treatment: Yes    Subjective Data  Subjective: "we think it was the rug on the porch because it was slide over when i fell" Patient Stated Goal: to return home with husband   Prior Functioning     Home Living Lives With: Spouse Available Help at Discharge: Family;Other (Comment) (works during day home alone during day  time) Type of Home: House Home Access: Stairs to enter [employer] of Steps: 2 Entrance Stairs-Rails: Right;Can reach both;Left Home Layout: One level Bathroom Shower/Tub: Walk-in Contractor: Handicapped height Bathroom Accessibility: Yes How Accessible: Accessible via walker Home Adaptive Equipment: Shower chair with back;Walker - rolling Prior Function Level of Independence: Independent Able to Take Stairs?: Yes Driving: Yes (pt last drove just prior to surgery) Vocation: Retired Musician: No difficulties Dominant Hand: Right         Vision/Perception Vision - History Baseline Vision: No visual deficits Patient Visual Report: No change from baseline   Cognition  Cognition Overall Cognitive Status: Appears within functional limits for tasks assessed/performed Arousal/Alertness: Awake/alert Orientation Level: Appears intact for tasks assessed Behavior During Session: [facility] for tasks performed    Extremity/Trunk Assessment Right Upper Extremity Assessment RUE ROM/Strength/Tone: Within functional levels RUE Sensation: WFL - Light Touch RUE Coordination: WFL - gross/fine motor Left Upper Extremity Assessment LUE ROM/Strength/Tone: Within functional levels LUE Sensation: WFL - Light Touch LUE Coordination: WFL - gross/fine motor Right Lower Extremity Assessment RLE ROM/Strength/Tone: Within functional levels RLE Sensation: Deficits RLE Sensation Deficits: c/o sharp shooting pain from groin to knee with ambulation Left Lower Extremity Assessment LLE ROM/Strength/Tone: Within functional levels LLE Sensation: WFL - Light Touch     Mobility Bed Mobility Bed Mobility: Not assessed (pt sitting in chair when entering room) Transfers Sit to Stand: 4: Min assist;From chair/3-in-1;From toilet Stand to Sit: 4: Min assist;To chair/3-in-1;To toilet Details for Transfer Assistance: (A) to initiate and slowly descend from commode  with max cues for hand placement.  Pt tends to keep hands on RW.     Exercise     Balance Dynamic Standing Balance Dynamic Standing - Balance Support: No upper extremity supported;During functional activity Dynamic Standing - Level of Assistance: 5: Stand by assistance;Other (comment) (wash hands at sink)   End of Session OT - End of Session Activity Tolerance: Patient tolerated treatment well Patient left: in chair;with call bell/phone within reach Nurse Communication: Mobility status;Precautions  GO     Lucile Shutters 10/31/2012, 1:46 PM Pager: [phone]

## 2012-10-31 NOTE — Progress Notes (Signed)
[facility] Progress Note [place] TEAM 1 - Stepdown/ICU TEAM   Patricia Stein NWG:[id number] DOB: 06-28-1946 DOA: 10/30/2012 PCP: Kirstie Peri, MD  Brief narrative: 67 year old female, with known history of DM-2, GERD, hypothyroidism, dyslipidemia, COPD, OSA on nocturnal CPAP, anxiety, fibromyalgia, HTN, s/p abdominal hysterectomy, s/p tonsillectomy, s/p carpal tunnel release, s/p multiple back surgeries (6 in total), most recently, Left L2-3 anterior lateral retroperitoneal diskectomy and interbody fusion on 10/14/12. Patient was hospitalized 10/14/12-10/16/12. Since discharge, she had been ambulating with a walker, although she did have some back pain. She had developed a local erythematous reaction to her wound dressing, with blistering, and had applied an antibiotic ointment to the area, with resolution of blisters, but still had residual redness. On 10/25/12, she slipped and fell from her front porch, while ambulating without her walker, and landed on her right side, thankfully missing the step, and insisted she did not hit her head. Since then, she has had significant difficulty ambulating, and has right-sided back pain. On 10/27/12, she developed redness in her medial upper thighs, associated with soreness, and ocassional itching. The affected areas were indurated and circumscribed, and spread to involve the lower abdomen, flanks, right axilla, upper chest, as well as back, in an uneven, asymmetrical distribution. Redness and swelling in the right malar region appeared on 10/29/12, and also spread to involve her right ear. She had subjective fevers and chills. Because of worsening symptoms, her husband brought her to ED. Patient denied any new medication or environmental exposures.   Assessment/Plan:  Rash w/ urticaria Most c/w allergic reaction - concerning in that involves periorbital region - maximize non-selective and selective H blockers - one dose of IV steroid - topical steroid to assist  w/ itch - d/c abx as per ID suggestion  Modest hypotension Likely due to dilated vascular bed in setting of allergic reaction, as well as true DH - cont to gently hydrate - dose w/ antihistamines   Back pain NS has seen in f/u - xrays do not reveal any acute worrisome findings   DM Reasonably controlled at this time - follow trend  HTN Not an active issue at this time  Hypothyroidism Cont home synthroid dose  GERD Cont H2 blockers  COPD Well compensated at this time  OSA on chronic CPAP continue on CPAP as per home settings  Code Status: FULL Disposition Plan: transfer to tele bed  Consultants: ID Neurosurgery   Procedures: none  Antibiotics: Vanc  2/2>>2/3 Rocephin 2/2>>2/3  DVT prophylaxis: SCDs  HPI/Subjective: Pt c/o itching "all over."  Rash is w/o significant change at this time per her hx.  She denies any sob, wheeze, dysphagia, or tongue swelling.     Objective: Blood pressure 100/64, pulse 100, temperature 98.5 F (36.9 C), temperature source Oral, resp. rate 18, height 5' (1.524 m), weight 82.9 kg (182 lb 12.2 oz), SpO2 94.00%.  Intake/Output Summary (Last 24 hours) at 10/31/12 1607 Last data filed at 10/31/12 0813  Gross per 24 hour  Intake      0 ml  Output    500 ml  Net   -500 ml     Exam: General: No acute respiratory distress - some edema of R lower eye lid - conjunctiva NOT injected Skin: Erythematous, indurated and circumscribed areas of skin, involving both upper thighs, the surgical site in her right lower back, and the lower abdomen, flanks, right axilla, upper chest, as well back, right malar region and right ear pinna in an uneven,  asymmetrical distribution (as previously noted by Dr. Brien Few) Lungs: Clear to auscultation bilaterally without wheezes or crackles Cardiovascular: Regular rate and rhythm without murmur gallop or rub Abdomen: Nontender, nondistended, soft, bowel sounds positive, no rebound, no ascites, no appreciable  mass Extremities: No significant cyanosis, clubbing, or edema bilateral lower extremities  Data Reviewed: Basic Metabolic Panel:  Lab 10/31/12 1610 10/30/12 1335  NA 132* 130*  K 3.9 3.6  CL 97 91*  CO2 26 26  GLUCOSE 138* 114*  BUN 18 22  CREATININE 0.67 1.21*  CALCIUM 9.1 9.4  MG -- --  PHOS -- --   Liver Function Tests:  Lab 10/31/12 0555 10/30/12 1335  AST 12 17  ALT 8 9  ALKPHOS 85 100  BILITOT 0.4 0.4  PROT 6.2 7.4  ALBUMIN 2.7* 3.3*   CBC:  Lab 10/31/12 0555 10/30/12 1335  WBC 11.7* 13.4*  NEUTROABS -- 10.7*  HGB 13.4 14.5  HCT 38.7 42.5  MCV 92.6 92.8  PLT 266 301   CBG:  Lab 10/31/12 1201 10/31/12 0809 10/30/12 2305  GLUCAP 100* 131* 174*    Recent Results (from the past 240 hour(s))  CULTURE, BLOOD (ROUTINE X 2)     Status: Normal (Preliminary result)   Collection Time   10/30/12  1:20 PM      Component Value Range Status Comment   Specimen Description BLOOD LEFT ARM   Final    Special Requests BOTTLES DRAWN AEROBIC AND ANAEROBIC 10CC   Final    Culture  Setup Time 10/30/2012 21:00   Final    Culture     Final    Value:        BLOOD CULTURE RECEIVED NO GROWTH TO DATE CULTURE WILL BE HELD FOR 5 DAYS BEFORE ISSUING A FINAL NEGATIVE REPORT   Report Status PENDING   Incomplete   CULTURE, BLOOD (ROUTINE X 2)     Status: Normal (Preliminary result)   Collection Time   10/30/12  1:30 PM      Component Value Range Status Comment   Specimen Description BLOOD RIGHT HAND   Final    Special Requests BOTTLES DRAWN AEROBIC ONLY 2.5CC   Final    Culture  Setup Time 10/30/2012 21:00   Final    Culture     Final    Value:        BLOOD CULTURE RECEIVED NO GROWTH TO DATE CULTURE WILL BE HELD FOR 5 DAYS BEFORE ISSUING A FINAL NEGATIVE REPORT   Report Status PENDING   Incomplete      Studies:  Recent x-ray studies have been reviewed in detail by the Attending Physician  Scheduled Meds:  Reviewed in detail by the Attending Physician   Lonia Blood,  MD [facility] Office  [phone] Pager [phone]  On-Call/Text Page:      [email or web address]      password [email or web address]  If 7PM-7AM, please contact night-coverage [email or web address] Password [email or web address] 10/31/2012, 4:07 PM   LOS: 1 day

## 2012-11-01 DIAGNOSIS — E039 Hypothyroidism, unspecified: Secondary | ICD-10-CM

## 2012-11-01 DIAGNOSIS — K219 Gastro-esophageal reflux disease without esophagitis: Secondary | ICD-10-CM

## 2012-11-01 LAB — CBC WITH DIFFERENTIAL/PLATELET
Basophils Absolute: 0 10*3/uL (ref 0.0–0.1)
HCT: 36.8 % (ref 36.0–46.0)
Hemoglobin: 12.7 g/dL (ref 12.0–15.0)
Lymphocytes Relative: 8 % — ABNORMAL LOW (ref 12–46)
Lymphs Abs: 0.9 10*3/uL (ref 0.7–4.0)
MCV: 92.9 fL (ref 78.0–100.0)
Monocytes Absolute: 0.3 10*3/uL (ref 0.1–1.0)
Monocytes Relative: 2 % — ABNORMAL LOW (ref 3–12)
Neutro Abs: 9.8 10*3/uL — ABNORMAL HIGH (ref 1.7–7.7)
RBC: 3.96 MIL/uL (ref 3.87–5.11)
RDW: 14.6 % (ref 11.5–15.5)
WBC: 11 10*3/uL — ABNORMAL HIGH (ref 4.0–10.5)

## 2012-11-01 LAB — GLUCOSE, CAPILLARY
Glucose-Capillary: 163 mg/dL — ABNORMAL HIGH (ref 70–99)
Glucose-Capillary: 158 mg/dL — ABNORMAL HIGH (ref 70–99)

## 2012-11-01 MED ORDER — FUROSEMIDE 10 MG/ML IJ SOLN
INTRAMUSCULAR | Status: AC
  Administered 2012-11-01: 40 mg
  Filled 2012-11-01: qty 4

## 2012-11-01 MED ORDER — FUROSEMIDE 40 MG PO TABS
40.0000 mg | ORAL_TABLET | Freq: Every day | ORAL | Status: DC
  Administered 2012-11-01 – 2012-11-02 (×2): 40 mg via ORAL
  Filled 2012-11-01 (×2): qty 1

## 2012-11-01 MED ORDER — DIPHENHYDRAMINE HCL 25 MG PO CAPS
25.0000 mg | ORAL_CAPSULE | Freq: Four times a day (QID) | ORAL | Status: DC | PRN
  Administered 2012-11-01 – 2012-11-02 (×2): 25 mg via ORAL
  Filled 2012-11-01: qty 1

## 2012-11-01 NOTE — Progress Notes (Signed)
Occupational Therapy Treatment Patient Details Name: Patricia Stein MRN: [id number] DOB: [birth year] Today's Date: 11/01/2012 Time: 2130-8657 OT Time Calculation (min): 28 min  OT Assessment / Plan / Recommendation Comments on Treatment Session Pt with increased c/o pain today. Decribes pain as sudden sharp shooting pain from knee to hip. States "it feels like an electric shock" from my knee to my hip. Pt will benefit from continue OT services to improve strength  and improve pain control to max independence with ADL and funcitonal mbility for ADL to faciitate D/C home with intermittent S of husband    Follow Up Recommendations  Home health OT    Barriers to Discharge   none    Equipment Recommendations  None recommended by OT    Recommendations for Other Services  none  Frequency Min 2X/week   Plan Discharge plan remains appropriate    Precautions / Restrictions Precautions Precautions: Back Precaution Comments: pt able to verbalize 3/3 back precuations. Cues to avoid twisting Required Braces or Orthoses: Spinal Brace Spinal Brace: Applied in sitting position Restrictions Weight Bearing Restrictions: No   Pertinent Vitals/Pain Sharp shooting back pain. 6. premedicated    ADL  Grooming: Wash/dry hands;Wash/dry face;Supervision/safety Where Assessed - Grooming: Unsupported standing Toilet Transfer: Minimal assistance Toilet Transfer Method: Sit to stand Toilet Transfer Equipment: Comfort height toilet Toileting - Clothing Manipulation and Hygiene: Min guard Where Assessed - Toileting Clothing Manipulation and Hygiene: Sit to stand from 3-in-1 or toilet ADL Comments: Pt demonstrtes difficulty with low transfer from toilet. Adjusted BSC over toilet. discussed need for AE  to complete ADL. Will continue with AE tomorrow    OT Diagnosis:    OT Problem List:   OT Treatment Interventions:     OT Goals Acute Rehab OT Goals OT Goal Formulation: With patient Time For Goal  Achievement: 11/14/12 Potential to Achieve Goals: Good ADL Goals Pt Will Perform Grooming: with modified independence;Standing at sink ADL Goal: Grooming - Progress: Progressing toward goals Pt Will Perform Lower Body Bathing: with modified independence;Standing at sink ADL Goal: Lower Body Bathing - Progress: Progressing toward goals Pt Will Perform Lower Body Dressing: with modified independence;Sit to stand from chair ADL Goal: Lower Body Dressing - Progress: Progressing toward goals Pt Will Transfer to Toilet: with modified independence;Ambulation;Regular height toilet ADL Goal: Toilet Transfer - Progress: Progressing toward goals Miscellaneous OT Goals Miscellaneous OT Goal #1: Pt will don /doff brace MoD I as precursor to basic transfer. OT Goal: Miscellaneous Goal #1 - Progress: Progressing toward goals  Visit Information  Last OT Received On: 11/01/12 Assistance Needed: +1    Subjective Data      Prior Functioning       Cognition  Cognition Overall Cognitive Status: Appears within functional limits for tasks assessed/performed Arousal/Alertness: Awake/alert Orientation Level: Appears intact for tasks assessed Behavior During Session: [facility] for tasks performed    Mobility  Bed Mobility Bed Mobility: Rolling Right;Right Sidelying to Sit;Sitting - Scoot to Edge of Bed Rolling Right: 5: Supervision Right Sidelying to Sit: 4: Min assist;HOB flat;With rails Sitting - Scoot to Edge of Bed: 5: Supervision Transfers Transfers: Sit to Stand;Stand to Sit Sit to Stand: 4: Min assist;From chair/3-in-1 Stand to Sit: 4: Min assist;With upper extremity assist;To toilet Details for Transfer Assistance: vc for back precautions    Exercises      Balance     End of Session OT - End of Session Equipment Utilized During Treatment: Gait belt Activity Tolerance: Patient tolerated treatment well Patient left:  in chair;with call bell/phone within reach Nurse Communication: Mobility  status  GO     Dilia Alemany,HILLARY 11/01/2012, 6:12 PM [facility], OTR/L  [phone] 11/01/2012

## 2012-11-01 NOTE — Clinical Documentation Improvement (Signed)
Abnormal Labs Clarification  THIS DOCUMENT IS NOT A PERMANENT PART OF THE MEDICAL RECORD  TO RESPOND TO THE THIS QUERY, FOLLOW THE INSTRUCTIONS BELOW:  1. If needed, update documentation for the patient's encounter via the notes activity.  2. Access this query again and click edit on the [employer].  3. After updating, or not, click F2 to complete all highlighted (required) fields concerning your review. Select "additional documentation in the medical record" OR "no additional documentation provided".  4. Click Sign note button.  5. The deficiency will fall out of your InBasket *Please let us know if you are not able to complete this workflow by phone or e-mail (listed below).  Please update your documentation within the medical record to reflect your response to this query.                                                                                   11/01/12  Dear Dr. Vassie Loll Marton Redwood  In a better effort to capture your patient's severity of illness, reflect appropriate length of stay and utilization of resources, a review of the medical record has revealed the following indicators.    Based on your clinical judgment, please clarify and document in a progress note and/or discharge summary the clinical condition associated with the following supporting information:  In responding to this query please exercise your independent judgment.  The fact that a query is asked, does not imply that any particular answer is desired or expected.  Abnormal findings (laboratory, x-ray, pathologic, and other diagnostic results) are not coded and reported unless the physician indicates their clinical significance.   The medical record reflects the following clinical findings, please clarify the diagnostic and/or clinical significance:      Noted patient's Sodium level 130,  132 , was 135 about a month before current admission.  If appropriate please document secondary diagnosis  pertaining to lab values.  Thank you    Possible Clinical Conditions?                                  Hyponatremia  Hyposmolality  Abnormal lab values                   Other Condition:   Cannot Clinically Determine:     Treatment: NS @ 75 ml /hr  Evaluation: CMET  Qd x 2   Reviewed: hyponatremia due to dehydration; added to progress note. Thanks   Thank You,  Lavonda Jumbo  Clinical Documentation Specialist, BSN: Pager [phone]  Health Information Management 

## 2012-11-01 NOTE — Progress Notes (Addendum)
[facility] Progress Note    LUVENIA CRANFORD WUJ:[id number] DOB: 1946-09-26 DOA: 10/30/2012 PCP: Kirstie Peri, MD  Brief narrative: 67 year old female, with known history of DM-2, GERD, hypothyroidism, dyslipidemia, COPD, OSA on nocturnal CPAP, anxiety, fibromyalgia, HTN, s/p abdominal hysterectomy, s/p tonsillectomy, s/p carpal tunnel release, s/p multiple back surgeries (6 in total), most recently, Left L2-3 anterior lateral retroperitoneal diskectomy and interbody fusion on 10/14/12. Patient was hospitalized 10/14/12-10/16/12. Since discharge, she had been ambulating with a walker, although she did have some back pain. She had developed a local erythematous reaction to her wound dressing, with blistering, and had applied an antibiotic ointment to the area, with resolution of blisters, but still had residual redness. On 10/25/12, she slipped and fell from her front porch, while ambulating without her walker, and landed on her right side, thankfully missing the step, and insisted she did not hit her head. Since then, she has had significant difficulty ambulating, and has right-sided back pain. On 10/27/12, she developed redness in her medial upper thighs, associated with soreness, and ocassional itching. The affected areas were indurated and circumscribed, and spread to involve the lower abdomen, flanks, right axilla, upper chest, as well as back, in an uneven, asymmetrical distribution. Redness and swelling in the right malar region appeared on 10/29/12, and also spread to involve her right ear. She had subjective fevers and chills. Because of worsening symptoms, her husband brought her to ED. Patient denied any new medication or environmental exposures.   Assessment/Plan:  Rash w/ urticaria Most c/w allergic reaction  -change antihistamines to PRN Hold steroids for now. -patient advised to quit scratching herself. -most likely home in am.  Modest hypotension Likely due to dilated vascular bed in  setting of allergic reaction, as well as true dehydration. Resolved -Continue antihistamines; change IVF to [facility]   Back pain Neurosurgery has reviewed xray and endorses - xrays do not reveal any acute worrisome findings; ok to continue PT/OT; no intervention needed at this moment. No need for abx's either.  DM Reasonably controlled at this time - follow trend  HTN Patient on arrival with low BP; antihypertensive agents on hold Will resume her lasix today.  BP well controlled.  Hypothyroidism Cont home synthroid dose  GERD Cont H2 blockers  COPD Well compensated at this time -continue home inhalers; no wheezing.  OSA on chronic CPAP continue on CPAP as per home settings  Hyponatremia: -Due to dehydration -improving with IVF's  Code Status: FULL Disposition Plan: transfer to tele bed  Consultants: ID Neurosurgery   Procedures: none  Antibiotics: Vanc  2/2>>2/3 Rocephin 2/2>>2/3  DVT prophylaxis: SCDs  HPI/Subjective: Pt is feeling better today; erythematous lesion are almost complete resolved; but still with some itching "all over."  She denies any sob, wheeze, dysphagia, or tongue swelling.  Patient complaining of right groin/inner thigh pain.    Objective: Blood pressure 113/65, pulse 86, temperature 98.2 F (36.8 C), temperature source Oral, resp. rate 18, height 5' (1.524 m), weight 81.9 kg (180 lb 8.9 oz), SpO2 95.00%.  Intake/Output Summary (Last 24 hours) at 11/01/12 1428 Last data filed at 11/01/12 1300  Gross per 24 hour  Intake    960 ml  Output    500 ml  Net    460 ml     Exam: General: No acute respiratory distress - some edema of R lower eye lid  Skin: Significant improvement/almost completely resolve Erythematous, indurated and circumscribed areas of her skin, involving both upper thighs, the surgical site  in her right lower back, and the lower abdomen, flanks, right axilla, upper chest, as well back, right malar region and right ear  pinna in an uneven, asymmetrical distribution. Lungs: Clear to auscultation bilaterally without wheezes or crackles Cardiovascular: Regular rate and rhythm without murmur gallop or rub Abdomen: Nontender, nondistended, soft, bowel sounds positive, no rebound, no ascites, no appreciable mass Extremities: 1 pulse edema of lower extremities, also with 1+ in her upper extremities; mild anasarca. Neurologic: Nonfocal.  Data Reviewed: Basic Metabolic Panel:  Lab 10/31/12 1610 10/30/12 1335  NA 132* 130*  K 3.9 3.6  CL 97 91*  CO2 26 26  GLUCOSE 138* 114*  BUN 18 22  CREATININE 0.67 1.21*  CALCIUM 9.1 9.4  MG -- --  PHOS -- --   Liver Function Tests:  Lab 10/31/12 0555 10/30/12 1335  AST 12 17  ALT 8 9  ALKPHOS 85 100  BILITOT 0.4 0.4  PROT 6.2 7.4  ALBUMIN 2.7* 3.3*   CBC:  Lab 11/01/12 0510 10/31/12 0555 10/30/12 1335  WBC 11.0* 11.7* 13.4*  NEUTROABS 9.8* -- 10.7*  HGB 12.7 13.4 14.5  HCT 36.8 38.7 42.5  MCV 92.9 92.6 92.8  PLT 253 266 301   CBG:  Lab 11/01/12 1136 11/01/12 0633 10/31/12 2129 10/31/12 1706 10/31/12 1201  GLUCAP 149* 163* 222* 163* 100*    Recent Results (from the past 240 hour(s))  CULTURE, BLOOD (ROUTINE X 2)     Status: Normal (Preliminary result)   Collection Time   10/30/12  1:20 PM      Component Value Range Status Comment   Specimen Description BLOOD LEFT ARM   Final    Special Requests BOTTLES DRAWN AEROBIC AND ANAEROBIC 10CC   Final    Culture  Setup Time 10/30/2012 21:00   Final    Culture     Final    Value:        BLOOD CULTURE RECEIVED NO GROWTH TO DATE CULTURE WILL BE HELD FOR 5 DAYS BEFORE ISSUING A FINAL NEGATIVE REPORT   Report Status PENDING   Incomplete   CULTURE, BLOOD (ROUTINE X 2)     Status: Normal (Preliminary result)   Collection Time   10/30/12  1:30 PM      Component Value Range Status Comment   Specimen Description BLOOD RIGHT HAND   Final    Special Requests BOTTLES DRAWN AEROBIC ONLY 2.5CC   Final    Culture  Setup  Time 10/30/2012 21:00   Final    Culture     Final    Value:        BLOOD CULTURE RECEIVED NO GROWTH TO DATE CULTURE WILL BE HELD FOR 5 DAYS BEFORE ISSUING A FINAL NEGATIVE REPORT   Report Status PENDING   Incomplete   CULTURE, BLOOD (ROUTINE X 2)     Status: Normal (Preliminary result)   Collection Time   10/30/12  5:10 PM      Component Value Range Status Comment   Specimen Description BLOOD LEFT HAND   Final    Special Requests BOTTLES DRAWN AEROBIC ONLY 10CC   Final    Culture  Setup Time 10/31/2012 01:43   Final    Culture     Final    Value:        BLOOD CULTURE RECEIVED NO GROWTH TO DATE CULTURE WILL BE HELD FOR 5 DAYS BEFORE ISSUING A FINAL NEGATIVE REPORT   Report Status PENDING   Incomplete   CULTURE, BLOOD (  ROUTINE X 2)     Status: Normal (Preliminary result)   Collection Time   10/30/12  5:25 PM      Component Value Range Status Comment   Specimen Description BLOOD LEFT HAND   Final    Special Requests BOTTLES DRAWN AEROBIC ONLY [facility]   Final    Culture  Setup Time 10/31/2012 01:43   Final    Culture     Final    Value:        BLOOD CULTURE RECEIVED NO GROWTH TO DATE CULTURE WILL BE HELD FOR 5 DAYS BEFORE ISSUING A FINAL NEGATIVE REPORT   Report Status PENDING   Incomplete        Anamaria Dusenbury [phone]  If 7PM-7AM, please contact night-coverage [email or web address] Password [email or web address] 11/01/2012, 2:28 PM   LOS: 2 days

## 2012-11-02 ENCOUNTER — Encounter (HOSPITAL_COMMUNITY): Payer: Self-pay

## 2012-11-02 DIAGNOSIS — I959 Hypotension, unspecified: Secondary | ICD-10-CM

## 2012-11-02 LAB — BASIC METABOLIC PANEL
BUN: 9 mg/dL (ref 6–23)
CO2: 28 mEq/L (ref 19–32)
Chloride: 98 mEq/L (ref 96–112)
Creatinine, Ser: 0.44 mg/dL — ABNORMAL LOW (ref 0.50–1.10)

## 2012-11-02 LAB — CBC
HCT: 29.5 % — ABNORMAL LOW (ref 36.0–46.0)
MCV: 93.1 fL (ref 78.0–100.0)
Platelets: 238 10*3/uL (ref 150–400)
RBC: 3.17 MIL/uL — ABNORMAL LOW (ref 3.87–5.11)
WBC: 8.7 10*3/uL (ref 4.0–10.5)

## 2012-11-02 LAB — GLUCOSE, CAPILLARY: Glucose-Capillary: 116 mg/dL — ABNORMAL HIGH (ref 70–99)

## 2012-11-02 MED ORDER — DIPHENHYDRAMINE HCL 25 MG PO CAPS: 25.0000 mg | ORAL_CAPSULE | Freq: Four times a day (QID) | ORAL | Status: DC | PRN

## 2012-11-02 MED ORDER — OXYCODONE HCL 5 MG PO TABS: 5.0000 mg | ORAL_TABLET | Freq: Four times a day (QID) | ORAL | Status: DC | PRN

## 2012-11-02 MED ORDER — MAGIC MOUTHWASH
10.0000 mL | Freq: Two times a day (BID) | ORAL | Status: DC | PRN
  Administered 2012-11-02: 10 mL via ORAL
  Filled 2012-11-02: qty 10

## 2012-11-02 MED ORDER — HYDROCORTISONE 1 % EX LOTN: TOPICAL_LOTION | Freq: Four times a day (QID) | CUTANEOUS | Status: DC | PRN

## 2012-11-02 MED ORDER — MAGIC MOUTHWASH: 10.0000 mL | Freq: Two times a day (BID) | ORAL | Status: DC | PRN

## 2012-11-02 NOTE — Progress Notes (Signed)
Pt has home CPAP and places self on and off. Rt will continue to monitor.

## 2012-11-02 NOTE — Discharge Summary (Signed)
Physician Discharge Summary  Patricia Stein HYQ:[id number] DOB: 24-Sep-1946 DOA: 10/30/2012  PCP: Kirstie Peri, MD  Admit date: 10/30/2012 Discharge date: 11/02/2012  Time spent: Greater than 30 minutes  Recommendations for Outpatient Follow-up:  1. With Dr. Kirstie Peri, PCP in one week from hospital discharge. 2. With Dr. Julio Sicks, Neurosurgery in 5 days from hospital discharge. (Patient missed an appointment for today).  Discharge Diagnoses:  Active Problems:  Urticaria  Back pain  Diabetes mellitus  HTN (hypertension)  Hypothyroidism  GERD (gastroesophageal reflux disease)  COPD (chronic obstructive pulmonary disease)  OSA on CPAP   Discharge Condition: Improved and stable.  Diet recommendation: Heart healthy diet.  Filed Weights   10/30/12 2038 11/01/12 0536  Weight: 82.9 kg (182 lb 12.2 oz) 81.9 kg (180 lb 8.9 oz)    History of present illness:  67 year old female, with known history of DM-2, GERD, hypothyroidism, dyslipidemia, COPD, OSA on nocturnal CPAP, anxiety, fibromyalgia, HTN, s/p abdominal hysterectomy, s/p tonsillectomy, s/p carpal tunnel release, s/p multiple back surgeries (6 in total), most recently, Left L2-3 anterior lateral retroperitoneal diskectomy and interbody fusion on 10/14/12. Patient was hospitalized 10/14/12-10/16/12. Since discharge, she had been ambulating with a walker, although she did have some back pain. She had developed a local erythematous reaction to her wound dressing, with blistering, and had applied an antibiotic ointment to the area, with resolution of blisters, but still had residual redness. On 10/25/12, she slipped and fell from her front porch, while ambulating without her walker, and landed on her right side, thankfully missing the step, and insisted she did not hit her head. Since then, she has had significant difficulty ambulating, and has right-sided back pain. On 10/27/12, she developed redness in her medial upper thighs, associated with  soreness, and ocassional itching. The affected areas were indurated and circumscribed, and spread to involve the lower abdomen, flanks, right axilla, upper chest, as well as back, in an uneven, asymmetrical distribution. Redness and swelling in the right malar region appeared on 10/29/12, and also spread to involve her right ear. She had subjective fevers and chills. Because of worsening symptoms, her husband brought her to ED. Patient denied any new medication or environmental exposures.   Hospital Course:  1. Skin rash/urticaria: On initial examination, apparently was consistent with allergic reaction-? Etiology-? Tape. She was empirically started on IV antibiotics for suspected infection which was discontinued by ID. This improved with antihistaminics and topical steroids. It has almost resolved. This was present in the upper medial thigh, groin/abdomen, around surgical scar site and chest and face. She is also advised to seek immediate medical attention if there is any further recurrence or worsening. She verbalizes understanding. 2. Hypotension: Possibly secondary to allergic reaction. Blood pressures currently controlled off antihypertensives. May resume antihypertensives on blood pressure start rising, during outpatient followup. 3. Back pain, recent back surgery: Neurosurgery apparently reviewed x-rays and indicated that they do not reveal any acute worrisome findings. PT OT evaluated and recommend home health services which have been arranged. Patient had been prescribed Valium for spasms after recent surgery which she says she took only 2 or 3 times. This will be discontinued because she is also on Xanax when necessary for anxiety and opioids-risk of side effects/sedation etc. 4. Type 2 diabetes mellitus: Continue home metformin. 5. Hypertension: Antihypertensives currently held. May resume his outpatient depending on blood pressures. 6. Hypothyroidism: Continue Synthroid. 7. GERD: Continue  PPIs. 8. COPD: Compensated and stable. 9.  OSA: Continue CPAP at pressure settings.  10. Hyponatremia: Resolved after IV fluids for dehydration. 11. Mild anemia: Outpatient followup.  Procedures:  None  Consultations:  ID  Neurosurgery  Discharge Exam: Filed Vitals:   11/01/12 0536 11/01/12 1330 11/02/12 0418 11/02/12 1330  BP: 122/60 113/65 102/44 100/49  Pulse: 86 86 72 81  Temp: 98.6 F (37 C) 98.2 F (36.8 C) 97.5 F (36.4 C) 98 F (36.7 C)  TempSrc: Oral Oral Oral Oral  Resp: 18 18 19 18   Height:      Weight: 81.9 kg (180 lb 8.9 oz)     SpO2: 100% 95% 98% 96%    General: No acute respiratory distress. Skin: Significant improvement/almost completely resolve Erythematous, indurated and circumscribed areas of her skin, involving both upper thighs, the surgical site in her right lower back, and the lower abdomen, flanks, right axilla, upper chest, as well back, right malar region and right ear pinna in an uneven, asymmetrical distribution.  Lungs: Clear to auscultation bilaterally without wheezes or crackles  Cardiovascular: Regular rate and rhythm without murmur gallop or rub. Telemetry shows normal sinus rhythm.  Abdomen: Nontender, nondistended, soft, bowel sounds positive, no rebound, no ascites, no appreciable mass  Extremities: 1 pulse edema of lower extremities, also with 1+ in her upper extremities; mild anasarca.  Neurologic: Nonfocal. Alert and oriented.   Discharge Instructions      Discharge Orders    Future Orders Please Complete By Expires   Diet - low sodium heart healthy      Increase activity slowly      Call MD for:      Comments:   Worsening skin rash or itching.   Call MD for:  severe uncontrolled pain      Call MD for:  redness, tenderness, or signs of infection (pain, swelling, redness, odor or green/yellow discharge around incision site)      Call MD for:  temperature >100.4          Medication List     As of 11/02/2012  3:31 PM     STOP taking these medications         amLODipine 5 MG tablet   Commonly known as: NORVASC      diazepam 5 MG tablet   Commonly known as: VALIUM      HYDROcodone-acetaminophen 5-325 MG per tablet   Commonly known as: NORCO/VICODIN      losartan-hydrochlorothiazide 100-25 MG per tablet   Commonly known as: HYZAAR      metoprolol succinate 100 MG 24 hr tablet   Commonly known as: TOPROL-XL      TAKE these medications         albuterol 108 (90 BASE) MCG/ACT inhaler   Commonly known as: PROVENTIL HFA;VENTOLIN HFA   Inhale 1 puff into the lungs every 6 (six) hours as needed. For wheezing      ALPRAZolam 0.5 MG tablet   Commonly known as: XANAX   Take 0.5 mg by mouth 3 (three) times daily as needed. For anxiety      diphenhydrAMINE 25 mg capsule   Commonly known as: BENADRYL   Take 1 capsule (25 mg total) by mouth every 6 (six) hours as needed for itching.      DULoxetine 60 MG capsule   Commonly known as: CYMBALTA   Take 60 mg by mouth 2 (two) times daily.      furosemide 40 MG tablet   Commonly known as: LASIX   Take 40 mg by mouth every morning.  hydrocortisone 1 % lotion   Apply topically 4 (four) times daily as needed. Apply to affected area as needed for itching      levothyroxine 125 MCG tablet   Commonly known as: SYNTHROID, LEVOTHROID   Take 125 mcg by mouth daily.      magic mouthwash Soln   Take 10 mLs by mouth 2 (two) times daily as needed (Sore mouth.). Swish and swallow.      metFORMIN 500 MG (MOD) 24 hr tablet   Commonly known as: GLUMETZA   Take 500 mg by mouth 3 (three) times daily.      morphine 15 MG 12 hr tablet   Commonly known as: MS CONTIN   Take 15 mg by mouth 3 (three) times daily. Takes with ms contin 30mg       morphine 30 MG 12 hr tablet   Commonly known as: MS CONTIN   Take 30 mg by mouth 3 (three) times daily. Takes with ms contin 15mg       niacin 1000 MG CR tablet   Commonly known as: NIASPAN   Take 1,000 mg by mouth 2 (two)  times daily.      omeprazole 20 MG capsule   Commonly known as: PRILOSEC   Take 20 mg by mouth 2 (two) times daily.      oxyCODONE 5 MG immediate release tablet   Commonly known as: Oxy IR/ROXICODONE   Take 1 tablet (5 mg total) by mouth every 6 (six) hours as needed for pain.      polyethylene glycol packet   Commonly known as: MIRALAX / GLYCOLAX   Take 17 g by mouth daily as needed. For constipation      potassium chloride 10 MEQ tablet   Commonly known as: K-DUR   Take 10 mEq by mouth every morning.      pregabalin 150 MG capsule   Commonly known as: LYRICA   Take 150 mg by mouth 2 (two) times daily.      rosuvastatin 20 MG tablet   Commonly known as: CRESTOR   Take 20 mg by mouth daily.        Follow-up Information    Follow up with [facility], MD. Schedule an appointment as soon as possible for a visit in 1 week.   Contact information:   [address]  [place] [address] [phone]       Follow up with Julio Sicks A, MD. Schedule an appointment as soon as possible for a visit in 5 days.   Contact information:   [address] [place] [address] [phone]           The results of significant diagnostics from this hospitalization (including imaging, microbiology, ancillary and laboratory) are listed below for reference.    Significant Diagnostic Studies: Dg Chest 2 View  10/11/2012  *RADIOLOGY REPORT*  Clinical Data: Lumbar fusion  CHEST - 2 VIEW  Comparison: 10/05/2011  Findings: No acute infiltrate or pulmonary edema. Cardiomediastinal silhouette is stable. Stable central mild bronchitic changes. Bony thorax is unremarkable.  IMPRESSION: No active disease.  No significant change.  Stable central mild bronchitic changes.   Original Report Authenticated By: Natasha Mead, M.D.    Dg Lumbar Spine 2-3 Views  10/30/2012  *RADIOLOGY REPORT*  Clinical Data: 67 year old female status post fall.  Back surgery 2 weeks ago.  LUMBAR SPINE - 2-3 VIEW   Comparison: Intraoperative radiographs 10/14/2012 and earlier.  Findings: Sequelae of left lateral and interbody fusion at L2-L3  again noted and superimposed on the preexisting L3-L4 and L4-L5 transpedicular and interbody fusion.  Stable vertebral height and alignment.  Visible lower thoracic levels appears stable.  Sacral ala and SI joints appear stable.  Calcified atherosclerosis.  IMPRESSION: Stable postoperative appearance of the lumbar spine.   Original Report Authenticated By: Erskine Speed, M.D.    Dg Lumbar Spine 2-3 Views  10/14/2012  *RADIOLOGY REPORT*  Clinical Data: L2-L3 fixation.  LUMBAR SPINE - 2-3 VIEW  Comparison: 09/13/2012 CT.  Findings: Remote L3-L5 trans pedicle screw fixation.  2 intraoperative images which demonstrate a left-sided plate and screw fixation device at the L2-L3 level. No acute hardware complication.  IMPRESSION: Intraoperative imaging of L2-L3 fixation.   Original Report Authenticated By: Jeronimo Greaves, M.D.    Mr Lumbar Spine W Wo Contrast  10/30/2012  *RADIOLOGY REPORT*  Clinical Data: Multiple prior lumbar spine operations.  Most recent operation 2 weeks ago.  Fall.  New left leg pain.  MRI LUMBAR SPINE WITHOUT AND WITH CONTRAST  Technique:  Multiplanar and multiecho pulse sequences of the lumbar spine were obtained without and with intravenous contrast.  Contrast: 15mL MULTIHANCE GADOBENATE DIMEGLUMINE 529 MG/ML IV SOLN  Comparison: MRI 05/08/2011.CT myelogram 11/15/2011  Findings: Axial images are degraded by motion artifact, particularly the Postcontrast T1-weighted images.  Straightening of the normal lumbar lordosis.  Posterior rod and pedicle screw fixation extends from L3 through L5.  Ankylosis across L5-S1.  L3- L4 pedicle screws are present on the prior comparison. Paraspinal soft tissues appear similar.  Transverse lumbar interbody fusion is present at L2-L3.  There is diffuse swelling and edema of the right psoas muscle with post-gadolinium enhancement.  This may  be postoperative.  Infection cannot be excluded.  Similar postoperative changes are present in the left psoas muscle.  There is a focal 2 cm left psoas muscle hematoma.  There also appears to be another small hematoma around the left lateral plate and screw fixation apparatus at L2-L3.  The right psoas the right psoas muscle demonstrates a 16 mm x 15 mm nonenhancing heterogeneous fluid collection which may represent hematoma or abscess.  There is surrounding phlegmon in the right psoas.  Lower thoracic levels appear unchanged.  L1-L2:  Mild disc degeneration with shallow broad-based bulging. No stenosis.  The central canal and foramina patent.  L2-L3:  Interval discectomy.  Left lateral plate and screw fixation.  L2 laminectomy.  Configuration of the central canal appears similar to the prior myelogram.   On axial images, there appears to be marked swelling of the left L2 foramen in the lateral aspect of the foramen however this could be a small disc fragment. Evaluation is difficult in this time frame postoperatively.  L3-L4:  Wide posterior decompression.  Solid fusion.  L4-L5:  Postoperative changes of discectomy.  No bridging bone is seen.  Wide posterior decompression.  L5-S1:  Wide posterior decompression with solid fusion.  IMPRESSION:  1.  No epidural hematoma or epidural process producing neural compression. 2.  Interval postoperative changes of transverse lumbar interbody fusion at L2-L3.  Left lateral plate and screw fixation. Collections are present in the left psoas muscle with signal characteristics most compatible with hematoma.  Superimposed infection cannot be excluded.  Diffuse phlegmon in the right psoas muscle may be postoperative.  Phlegmon in the right psoas muscle with heterogeneous fluid collection measuring 16 mm x 15 mm.  This may represent postoperative hematoma, seroma however abscess cannot be excluded. 3.  Small mass in the left  neural foramen.  This may represent a swollen nerve root  or small disc fragment.  This is poorly evaluated because of motion artifact on the post-gadolinium images. 4.  L3-L5 posterior lumbar interbody fusion hardware.  Solid fusion at L3-L4 and L5-S1.   Original Report Authenticated By: Andreas Newport, M.D.    Dg Pelvis Portable  10/30/2012  *RADIOLOGY REPORT*  Clinical Data: Fall.  Back pain.  PORTABLE PELVIS  Comparison: None.  Findings: L3-L5 posterior lumbar interbody fusion is visualized. Pelvic rings are intact.  Both hips are externally rotated, right greater than left.  The pelvic rings appear intact.  No acute osseous abnormality or fracture.  IMPRESSION: No acute osseous abnormality.  No visualized hardware complication of lumbar fusion hardware.   Original Report Authenticated By: Andreas Newport, M.D.    Dg C-arm [phone] Min  11/02/2012  *RADIOLOGY REPORT*  Clinical Data: L2-L3 fixation.  LUMBAR SPINE - 2-3 VIEW  Comparison: 09/13/2012 CT.  Findings: Remote L3-L5 trans pedicle screw fixation.  2 intraoperative images which demonstrate a left-sided plate and screw fixation device at the L2-L3 level. No acute hardware complication.  IMPRESSION: Intraoperative imaging of L2-L3 fixation.   Original Report Authenticated By: Jeronimo Greaves, M.D.     Microbiology: Recent Results (from the past 240 hour(s))  CULTURE, BLOOD (ROUTINE X 2)     Status: Normal (Preliminary result)   Collection Time   10/30/12  1:20 PM      Component Value Range Status Comment   Specimen Description BLOOD LEFT ARM   Final    Special Requests BOTTLES DRAWN AEROBIC AND ANAEROBIC 10CC   Final    Culture  Setup Time 10/30/2012 21:00   Final    Culture     Final    Value:        BLOOD CULTURE RECEIVED NO GROWTH TO DATE CULTURE WILL BE HELD FOR 5 DAYS BEFORE ISSUING A FINAL NEGATIVE REPORT   Report Status PENDING   Incomplete   CULTURE, BLOOD (ROUTINE X 2)     Status: Normal (Preliminary result)   Collection Time   10/30/12  1:30 PM      Component Value Range Status Comment    Specimen Description BLOOD RIGHT HAND   Final    Special Requests BOTTLES DRAWN AEROBIC ONLY 2.5CC   Final    Culture  Setup Time 10/30/2012 21:00   Final    Culture     Final    Value:        BLOOD CULTURE RECEIVED NO GROWTH TO DATE CULTURE WILL BE HELD FOR 5 DAYS BEFORE ISSUING A FINAL NEGATIVE REPORT   Report Status PENDING   Incomplete   CULTURE, BLOOD (ROUTINE X 2)     Status: Normal (Preliminary result)   Collection Time   10/30/12  5:10 PM      Component Value Range Status Comment   Specimen Description BLOOD LEFT HAND   Final    Special Requests BOTTLES DRAWN AEROBIC ONLY 10CC   Final    Culture  Setup Time 10/31/2012 01:43   Final    Culture     Final    Value:        BLOOD CULTURE RECEIVED NO GROWTH TO DATE CULTURE WILL BE HELD FOR 5 DAYS BEFORE ISSUING A FINAL NEGATIVE REPORT   Report Status PENDING   Incomplete   CULTURE, BLOOD (ROUTINE X 2)     Status: Normal (Preliminary result)   Collection Time   10/30/12  5:25 PM  Component Value Range Status Comment   Specimen Description BLOOD LEFT HAND   Final    Special Requests BOTTLES DRAWN AEROBIC ONLY [facility]   Final    Culture  Setup Time 10/31/2012 01:43   Final    Culture     Final    Value:        BLOOD CULTURE RECEIVED NO GROWTH TO DATE CULTURE WILL BE HELD FOR 5 DAYS BEFORE ISSUING A FINAL NEGATIVE REPORT   Report Status PENDING   Incomplete      Labs: Basic Metabolic Panel:  Lab 11/02/12 4696 10/31/12 0555 10/30/12 1335  NA 137 132* 130*  K 4.0 3.9 3.6  CL 98 97 91*  CO2 28 26 26   GLUCOSE 117* 138* 114*  BUN 9 18 22   CREATININE 0.44* 0.67 1.21*  CALCIUM 9.5 9.1 9.4  MG -- -- --  PHOS -- -- --   Liver Function Tests:  Lab 10/31/12 0555 10/30/12 1335  AST 12 17  ALT 8 9  ALKPHOS 85 100  BILITOT 0.4 0.4  PROT 6.2 7.4  ALBUMIN 2.7* 3.3*   No results found for this basename: LIPASE:5,AMYLASE:5 in the last 168 hours No results found for this basename: AMMONIA:5 in the last 168 hours CBC:  Lab 11/02/12  1340 11/02/12 0608 11/01/12 0510 10/31/12 0555 10/30/12 1335  WBC -- 8.7 11.0* 11.7* 13.4*  NEUTROABS -- -- 9.8* -- 10.7*  HGB 11.3* 10.1* 12.7 13.4 14.5  HCT 33.3* 29.5* 36.8 38.7 42.5  MCV -- 93.1 92.9 92.6 92.8  PLT -- 238 253 266 301   Cardiac Enzymes: No results found for this basename: CKTOTAL:5,CKMB:5,CKMBINDEX:5,TROPONINI:5 in the last 168 hours BNP: BNP (last 3 results) No results found for this basename: PROBNP:3 in the last 8760 hours CBG:  Lab 11/02/12 1123 11/02/12 0545 11/01/12 2106 11/01/12 1634 11/01/12 1136  GLUCAP 128* 116* 158* 136* 149*       Signed:  Khiree Bukhari  [facility] 11/02/2012, 3:31 PM

## 2012-11-02 NOTE — ED Provider Notes (Signed)
Medical screening examination/treatment/procedure(s) were conducted as a shared visit with non-physician practitioner(s) and myself.  I personally evaluated the patient during the encounter   Pt with history initially suggestive of allergic reaction with pruritis, hive like distribution and onset. No airway compromise. However, pt with subjective chills and fever, tenderness, very warm to touch in large patches of body.  WBC elevated with left shift.  Consideration for cellulitis as well.  Will admit to medicine.    Patricia Stein. Mattison Stuckey, MD 11/02/12 1407

## 2012-11-02 NOTE — Care Management Note (Signed)
    Page 1 of 1   11/02/2012     2:16:45 PM   CARE MANAGEMENT NOTE 11/02/2012  Patient:  Patricia Stein, Patricia Stein   Account Number:  [id number]  Date Initiated:  11/02/2012  Documentation initiated by:  Azaleah Usman  Subjective/Objective Assessment:   PT ADM ON 10/30/12 S/P FALL, URTICARIA.  PT WITH RECENT BACK SURGERY 10/14/12.  PTA, PT INDEPENDENT, LIVES WITH SPOUSE.     Action/Plan:   MET WITH PT TO DISCUSS HOME ARRANGEMENTS.  PT AGREEABLE TO HOME PT/OT AS RECOMMENDED.  REFERRAL TO [facility], PER CHOICE. START OF CARE 24-48 H POST DC DATE.  PT HAS RW AT HOME.   Anticipated DC Date:  11/02/2012   Anticipated DC Plan:  HOME W HOME HEALTH SERVICES      DC Planning Services  CM consult      [facility] Choice  HOME HEALTH   Choice offered to / List presented to:  C-1 Patient        HH arranged  HH-2 PT  HH-3 OT      [facility] agency  [facility]   Status of service:  Completed, signed off Medicare Important Message given?   (If response is "NO", the following Medicare IM given date fields will be blank) Date Medicare IM given:   Date Additional Medicare IM given:    Discharge Disposition:  HOME W HOME HEALTH SERVICES  Per UR Regulation:  Reviewed for med. necessity/level of care/duration of stay  If discussed at Long Length of Stay Meetings, dates discussed:    Comments:

## 2012-11-02 NOTE — Progress Notes (Signed)
Occupational Therapy Treatment Patient Details Name: Patricia Stein MRN: [id number] DOB: 02/16/1946 Today's Date: 11/02/2012 Time: 7829-5621 OT Time Calculation (min): 15 min  OT Assessment / Plan / Recommendation Comments on Treatment Session Pt seen prior to D/C for LB ADL retraining with AE. Pt to continue with [facility] services    Follow Up Recommendations  Home health OT    Barriers to Discharge       Equipment Recommendations  None recommended by OT    Recommendations for Other Services    Frequency Min 2X/week   Plan Discharge plan remains appropriate    Precautions / Restrictions Precautions Precautions: Back Precaution Booklet Issued: Yes (comment) Precaution Comments: pt able to verbalize 3/3 back precuations. Cues to avoid twisting   Pertinent Vitals/Pain No pain    ADL  ADL Comments: PT issued sock aid and reacher due to financial constraints. Pt @ mod I level with use of AE.     OT Diagnosis:    OT Problem List:   OT Treatment Interventions:     OT Goals Acute Rehab OT Goals OT Goal Formulation: With patient Time For Goal Achievement: 11/14/12 Potential to Achieve Goals: Good ADL Goals Pt Will Perform Grooming: with modified independence;Standing at sink ADL Goal: Grooming - Progress: Met Pt Will Perform Lower Body Bathing: with modified independence;Standing at sink ADL Goal: Lower Body Bathing - Progress: Met Pt Will Perform Lower Body Dressing: with modified independence;Sit to stand from chair ADL Goal: Lower Body Dressing - Progress: Met Pt Will Transfer to Toilet: with modified independence;Ambulation;Regular height toilet ADL Goal: Toilet Transfer - Progress: Progressing toward goals Miscellaneous OT Goals Miscellaneous OT Goal #1: Pt will don /doff brace MoD I as precursor to basic transfer. OT Goal: Miscellaneous Goal #1 - Progress: Progressing toward goals  Visit Information  Last OT Received On: 11/02/12 Assistance Needed: +1    Subjective  Data      Prior Functioning       Cognition       Mobility  Bed Mobility Bed Mobility: Supine to Sit Rolling Right: 6: Modified independent (Device/Increase time) Right Sidelying to Sit: 6: Modified independent (Device/Increase time) Supine to Sit: 6: Modified independent (Device/Increase time) Sitting - Scoot to Edge of Bed: 6: Modified independent (Device/Increase time) Transfers Transfers: Sit to Stand;Stand to Sit Sit to Stand: 5: Supervision Stand to Sit: 5: Supervision    Exercises      Balance  S   End of Session OT - End of Session Equipment Utilized During Treatment: Gait belt Activity Tolerance: Patient tolerated treatment well Patient left: in bed;with call bell/phone within reach;with nursing in room Nurse Communication: Mobility status  GO     Si Jachim,HILLARY 11/02/2012, 5:42 PM [facility], OTR/L  [phone] 11/02/2012

## 2012-11-02 NOTE — Progress Notes (Signed)
Reviewed w/ pt d/c instructions re: f/u appts. Diet, activity, meds.  Pt verbalized understanding

## 2012-11-02 NOTE — Progress Notes (Signed)
Physical Therapy Treatment Patient Details Name: Patricia Stein MRN: [id number] DOB: Jun 04, 1946 Today's Date: 11/02/2012 Time: 4098-1191 PT Time Calculation (min): 24 min  PT Assessment / Plan / Recommendation Comments on Treatment Session  Reinforced all back education incl bed mobility lifting restriction, bracing issues and progression of activity. pt should be safe with intermittent help from her husband    Follow Up Recommendations  No PT follow up;Supervision - Intermittent     Does the patient have the potential to tolerate intense rehabilitation     Barriers to Discharge        Equipment Recommendations  None recommended by PT    Recommendations for Other Services    Frequency Min 3X/week   Plan Discharge plan remains appropriate;Frequency remains appropriate    Precautions / Restrictions Precautions Precautions: Back Precaution Comments: pt able to verbalize 3/3 back precuations. Cues to avoid twisting Spinal Brace:  (pt did not have her brace)   Pertinent Vitals/Pain R groin pain since recent fall, not rated    Mobility  Bed Mobility Bed Mobility: Rolling Right;Right Sidelying to Sit Rolling Right: 6: Modified independent (Device/Increase time) Right Sidelying to Sit: 4: Min assist;HOB flat;With rails;Other (comment) (due to groin pain) Details for Bed Mobility Assistance: reinforce safe technique, pt need very minimal assist to get from L side to sit due to groin pain. Transfers Transfers: Sit to Stand;Stand to Sit Sit to Stand: With upper extremity assist;From bed;4: Min guard Stand to Sit: 4: Min guard;With upper extremity assist;To bed Details for Transfer Assistance: reinforce hand placement and general safety Ambulation/Gait Ambulation/Gait Assistance: 4: Min guard Ambulation Distance (Feet): 150 Feet Assistive device: Rolling walker Ambulation/Gait Assistance Details: generally steady on RW, vc's for improved use. Gait Pattern: Step-through  pattern;Decreased trunk rotation Stairs: No    Exercises     PT Diagnosis:    PT Problem List:   PT Treatment Interventions:     PT Goals Acute Rehab PT Goals Time For Goal Achievement: 11/07/12 Potential to Achieve Goals: Good PT Goal: Supine/Side to Sit - Progress: Progressing toward goal PT Goal: Sit to Supine/Side - Progress: Progressing toward goal PT Goal: Sit to Stand - Progress: Progressing toward goal PT Goal: Stand to Sit - Progress: Progressing toward goal PT Transfer Goal: Bed to Chair/Chair to Bed - Progress: Progressing toward goal PT Goal: Ambulate - Progress: Progressing toward goal  Visit Information  Last PT Received On: 11/02/12 Assistance Needed: +1    Subjective Data  Subjective: I was doing well at home.Marland KitchenMarland KitchenMy husband and I forgot to bring the brace in our rush to get here.   Cognition  Cognition Overall Cognitive Status: Appears within functional limits for tasks assessed/performed Arousal/Alertness: Awake/alert Orientation Level: Appears intact for tasks assessed Behavior During Session: [facility] for tasks performed    Balance  Balance Balance Assessed: No  End of Session PT - End of Session Activity Tolerance: Patient tolerated treatment well Patient left: Other (comment) (sitting EOB) Nurse Communication: Mobility status   GP     [name], Eliseo Gum 11/02/2012, 1:39 PM

## 2012-11-05 LAB — CULTURE, BLOOD (ROUTINE X 2)
Culture: NO GROWTH
Culture: NO GROWTH

## 2012-11-06 LAB — CULTURE, BLOOD (ROUTINE X 2): Culture: NO GROWTH

## 2013-05-22 IMAGING — CR DG CHEST 2V
2 series · 2 of 2 positions shown · non-contrast
Comparison: 10/05/2011

CLINICAL DATA: Lumbar fusion

CHEST - 2 VIEW

[view not recorded (1 of 2)]
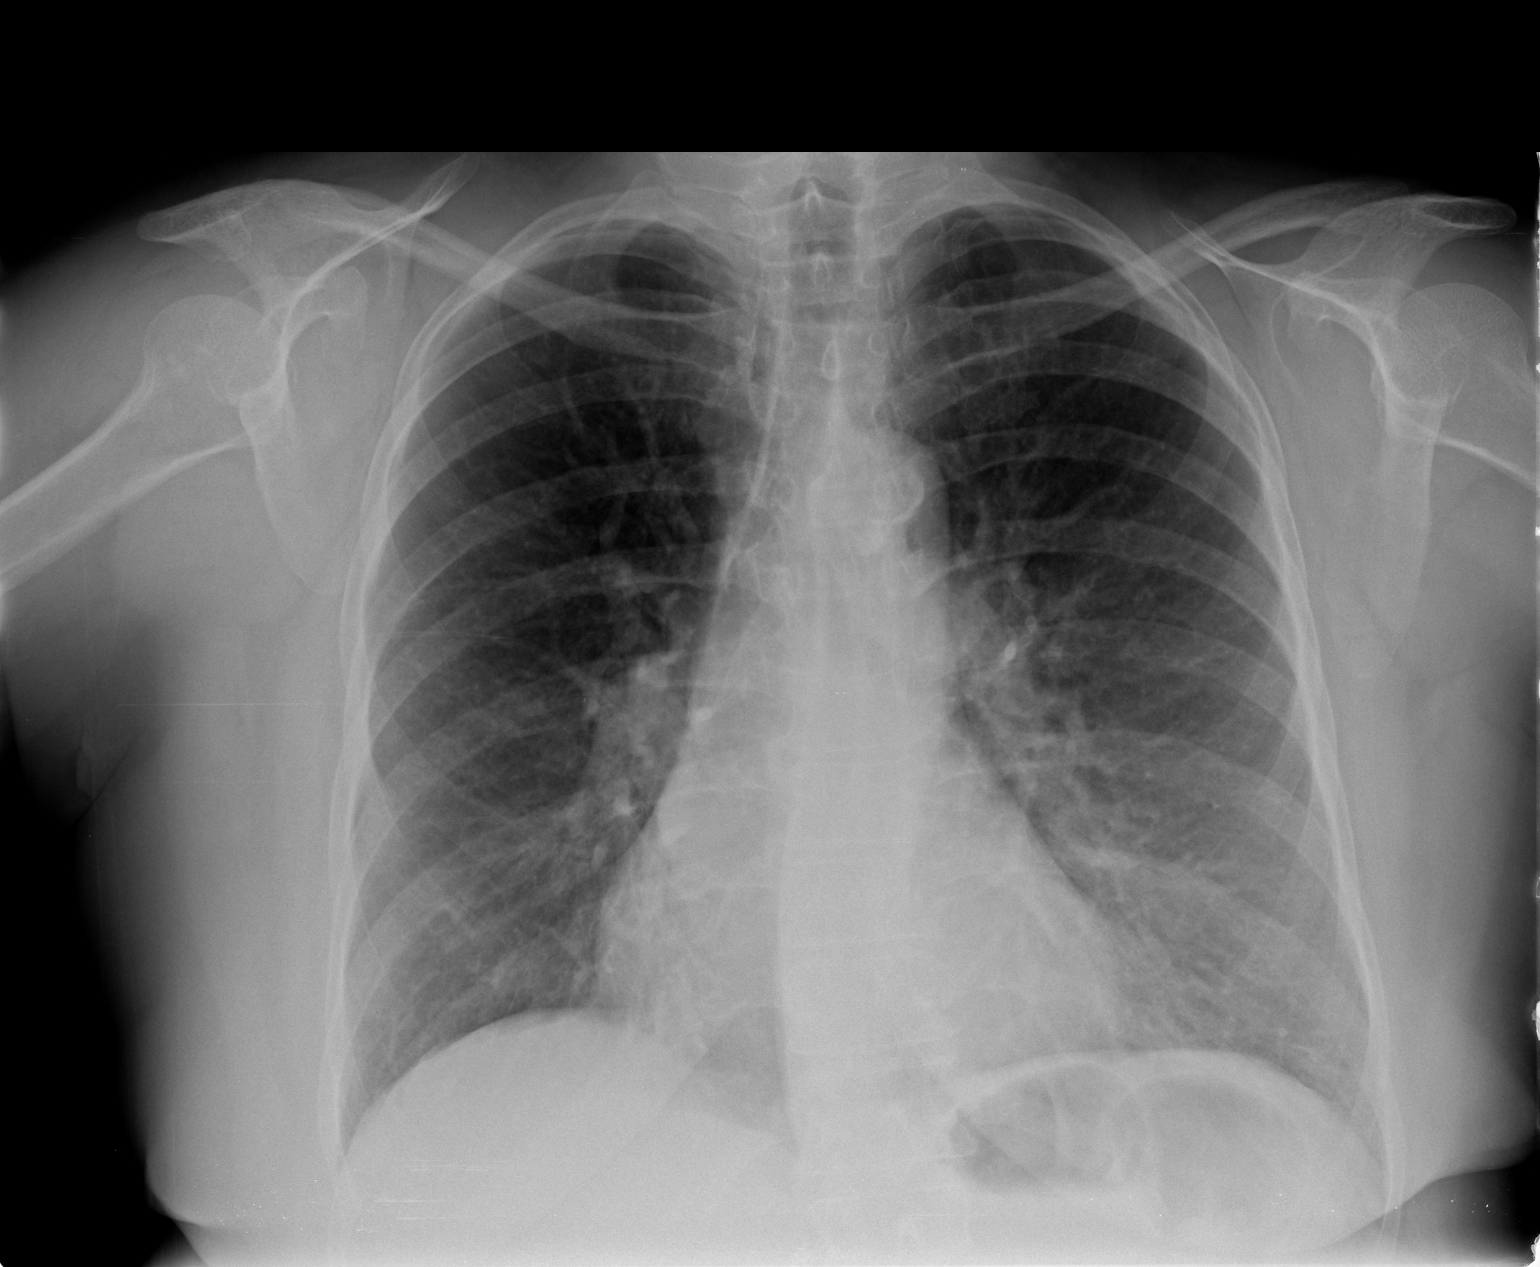

[view not recorded (2 of 2)]
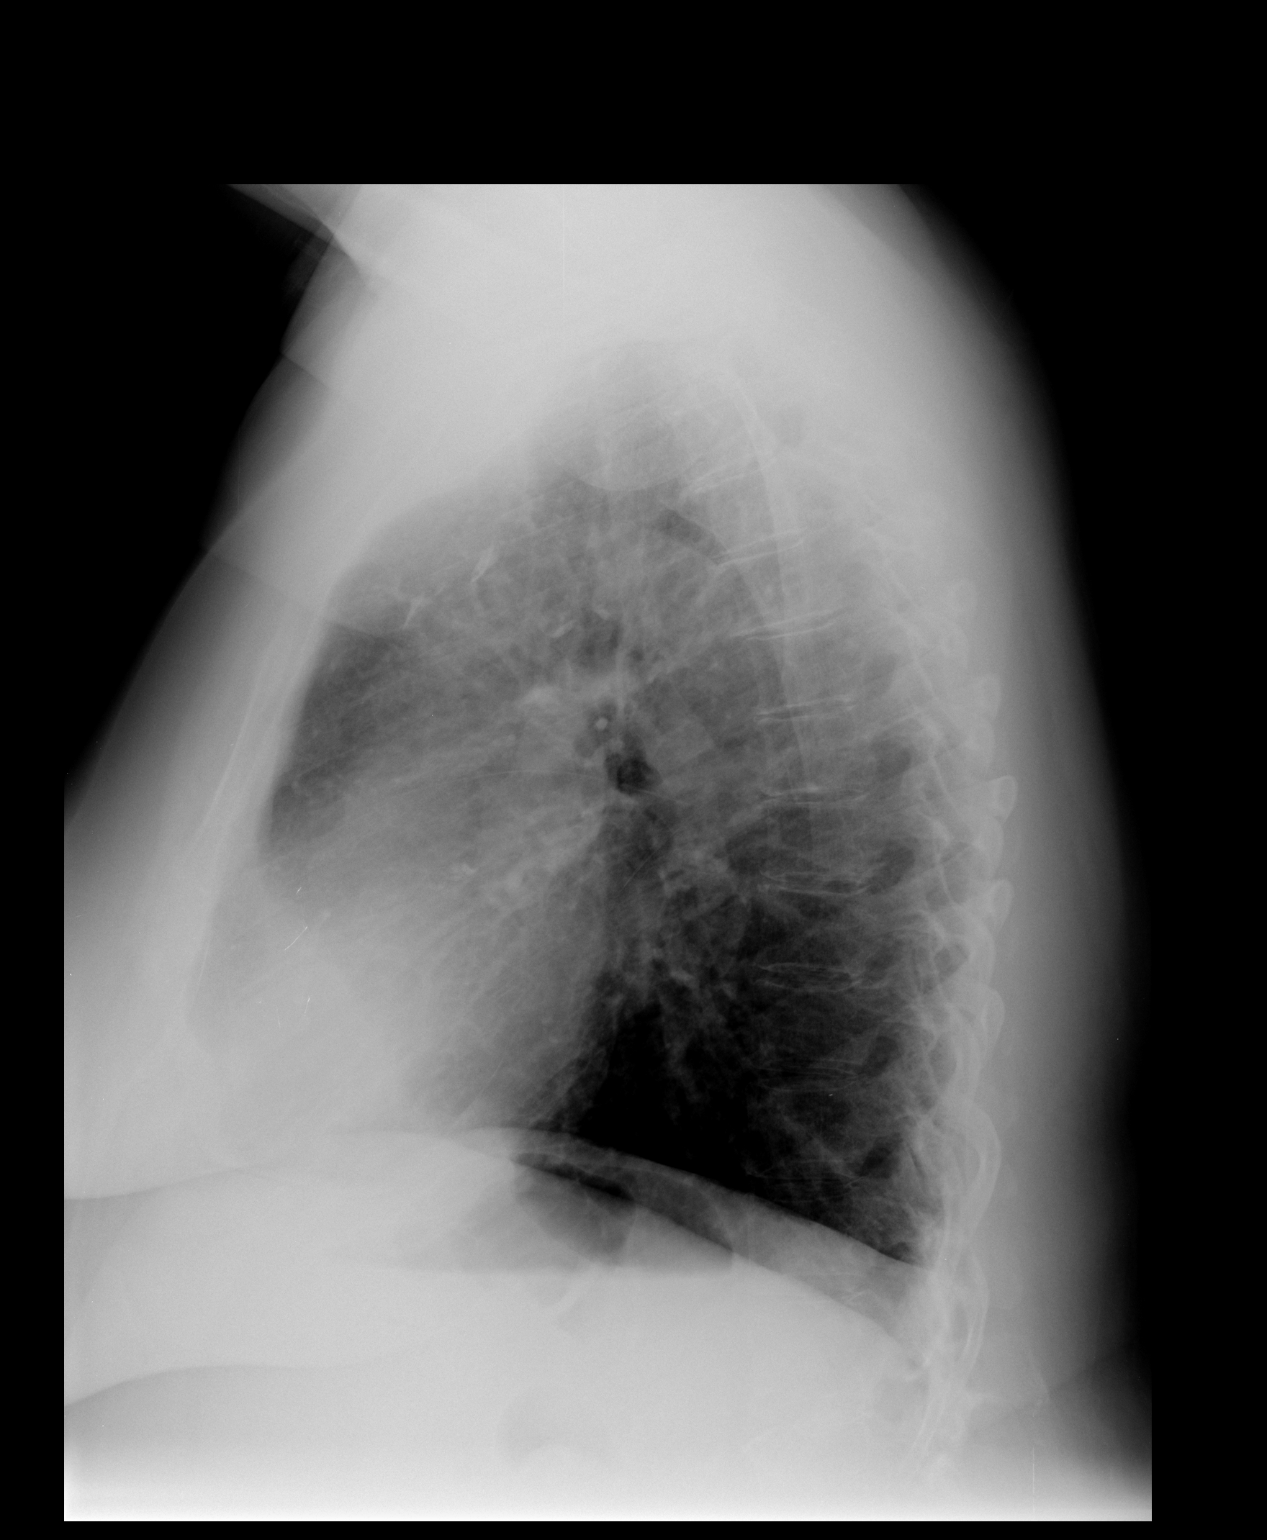

[2 of 2 positions shown; findings below may reference images not displayed]

FINDINGS: No acute infiltrate or pulmonary edema.
Cardiomediastinal silhouette is stable. Stable central mild
bronchitic changes. Bony thorax is unremarkable.
IMPRESSION: No active disease.  No significant change.  Stable central mild
bronchitic changes.

## 2013-05-25 IMAGING — RF DG LUMBAR SPINE 2-3V
1 series · 2 of 2 positions shown · non-contrast
Comparison: 09/13/2012 CT.

CLINICAL DATA: L2-L3 fixation.

LUMBAR SPINE - 2-3 VIEW

[Series 1: run · 2 of 2 slices shown]
[im 1/2]
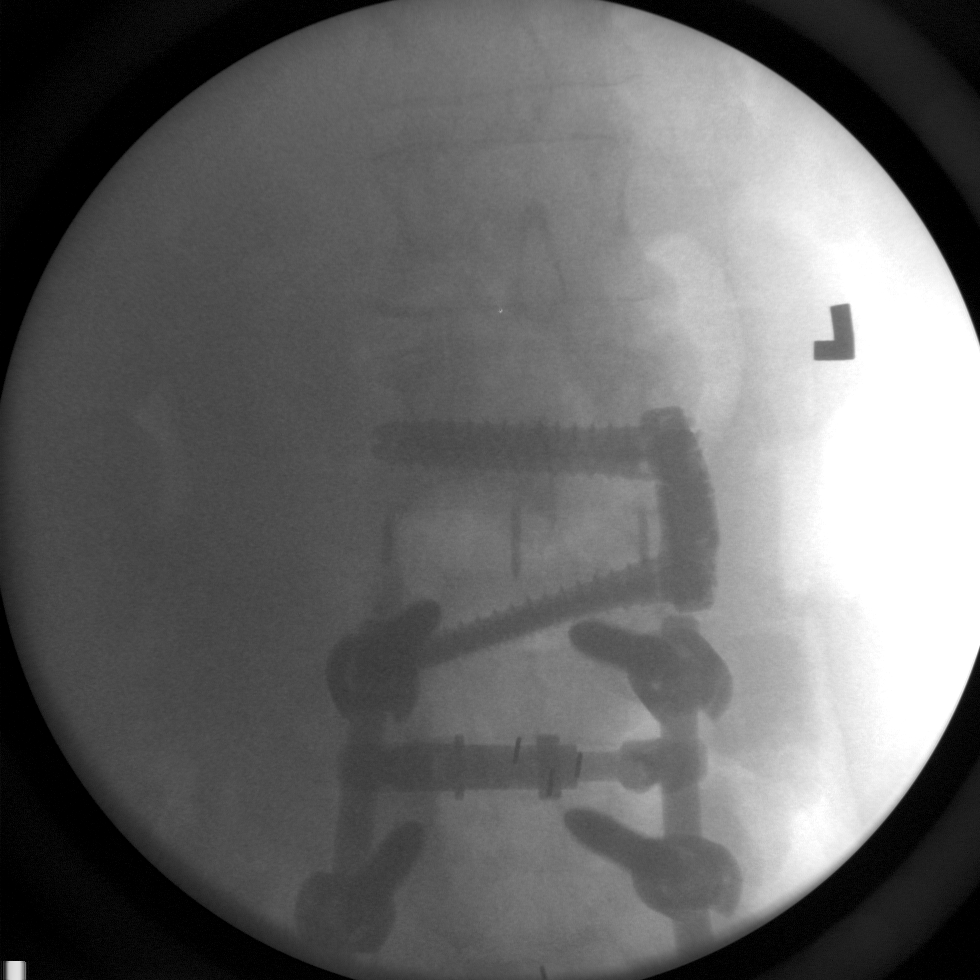
[im 2/2]
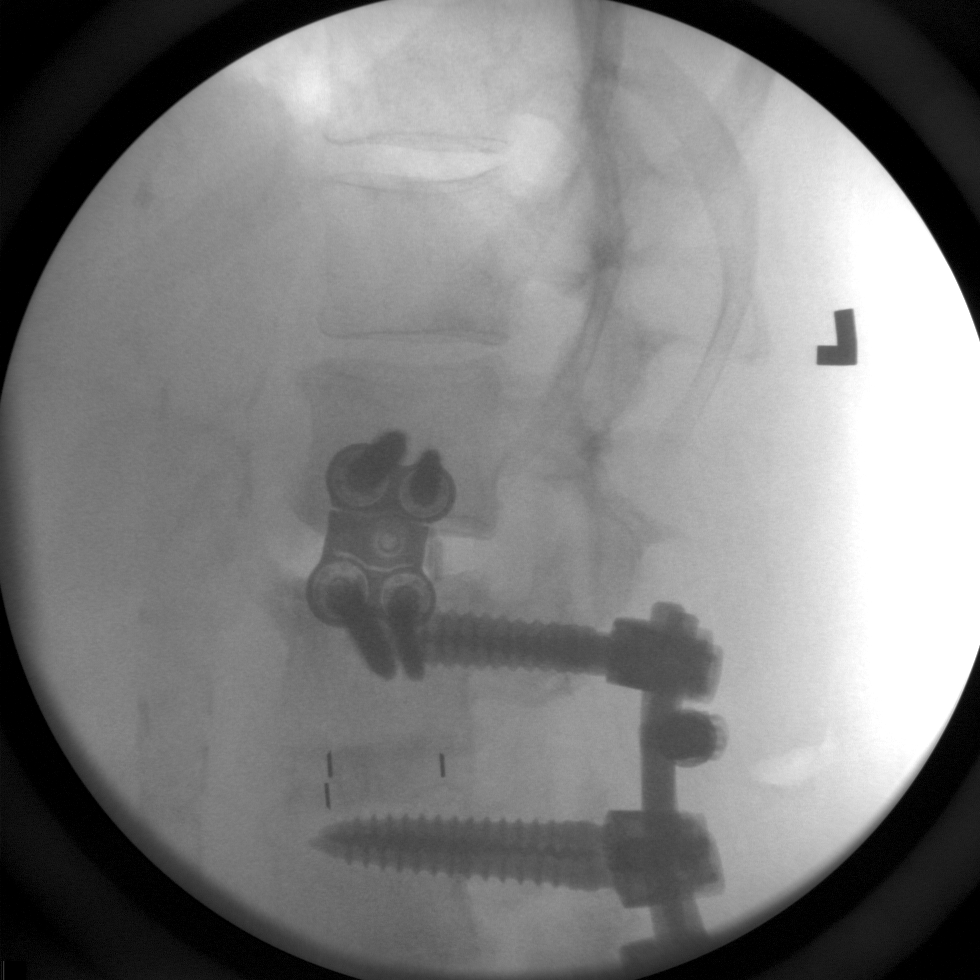

[2 of 2 positions shown; findings below may reference images not displayed]

FINDINGS: Remote L3-L5 trans pedicle screw fixation.  2
intraoperative images which demonstrate a left-sided plate and
screw fixation device at the L2-L3 level. No acute hardware
complication.
IMPRESSION: Intraoperative imaging of L2-L3 fixation.

## 2014-11-08 ENCOUNTER — Other Ambulatory Visit: Payer: Self-pay | Admitting: Neurosurgery

## 2014-11-08 DIAGNOSIS — M4722 Other spondylosis with radiculopathy, cervical region: Secondary | ICD-10-CM

## 2014-11-23 ENCOUNTER — Ambulatory Visit
Admission: RE | Admit: 2014-11-23 | Discharge: 2014-11-23 | Disposition: A | Discharge status: Home / Self Care | Payer: Medicare Other | Source: Ambulatory Visit | Attending: Neurosurgery | Admitting: Neurosurgery

## 2014-11-23 DIAGNOSIS — M4722 Other spondylosis with radiculopathy, cervical region: Secondary | ICD-10-CM

## 2014-11-29 ENCOUNTER — Other Ambulatory Visit: Payer: Self-pay | Admitting: Neurosurgery

## 2014-12-06 NOTE — Pre-Procedure Instructions (Signed)
Patricia BlonderCarolyn T Stein  12/06/2014   Your procedure is scheduled on:  Friday, March 18th   [facility] Admitting at 10:00 AM.  Call this number if you have problems the morning of surgery: [phone]   Remember:   Do not eat food or drink liquids after midnight Thursday.   Take these medicines the morning of surgery with A SIP OF WATER: Xanax, Cymbalta, Levothyroxine, Omeprazole. Pain medication.  Please use your inhaler the morning of surgery.                          DO NOT TAKE any diabetes medication the morning of surgery.   Do not wear jewelry, make-up or nail polish.  Do not wear lotions, powders, or perfumes. You may NOT wear deodorant the day of surgery.  Do not shave underarms & legs 48 hours prior to surgery.    Do not bring valuables to the hospital.  [facility] is not responsible for any belongings or valuables.               Contacts, dentures or bridgework may not be worn into surgery.  Leave suitcase in the car. After surgery it may be brought to your room.  For patients admitted to the hospital, discharge time is determined by your treatment team.    Name and phone number of your driver:    Special Instructions: "Preparing for Surgery" instruction sheet.   Please read over the following fact sheets that you were given: Pain Booklet, Coughing and Deep Breathing, MRSA Information and Surgical Site Infection Prevention

## 2014-12-07 ENCOUNTER — Encounter (HOSPITAL_COMMUNITY)
Admission: RE | Admit: 2014-12-07 | Discharge: 2014-12-07 | Disposition: A | Discharge status: Home / Self Care | Payer: Medicare Other | Source: Ambulatory Visit | Attending: Neurosurgery | Admitting: Neurosurgery

## 2014-12-07 ENCOUNTER — Encounter (HOSPITAL_COMMUNITY): Payer: Self-pay

## 2014-12-07 DIAGNOSIS — E118 Type 2 diabetes mellitus with unspecified complications: Secondary | ICD-10-CM | POA: Diagnosis not present

## 2014-12-07 DIAGNOSIS — I10 Essential (primary) hypertension: Secondary | ICD-10-CM | POA: Diagnosis not present

## 2014-12-07 DIAGNOSIS — G4733 Obstructive sleep apnea (adult) (pediatric): Secondary | ICD-10-CM | POA: Diagnosis not present

## 2014-12-07 DIAGNOSIS — M479 Spondylosis, unspecified: Secondary | ICD-10-CM | POA: Insufficient documentation

## 2014-12-07 DIAGNOSIS — Z01818 Encounter for other preprocedural examination: Secondary | ICD-10-CM | POA: Diagnosis present

## 2014-12-07 DIAGNOSIS — I498 Other specified cardiac arrhythmias: Secondary | ICD-10-CM | POA: Diagnosis not present

## 2014-12-07 DIAGNOSIS — E039 Hypothyroidism, unspecified: Secondary | ICD-10-CM | POA: Insufficient documentation

## 2014-12-07 DIAGNOSIS — M797 Fibromyalgia: Secondary | ICD-10-CM | POA: Insufficient documentation

## 2014-12-07 DIAGNOSIS — E782 Mixed hyperlipidemia: Secondary | ICD-10-CM | POA: Insufficient documentation

## 2014-12-07 DIAGNOSIS — R0789 Other chest pain: Secondary | ICD-10-CM | POA: Diagnosis not present

## 2014-12-07 DIAGNOSIS — Z01812 Encounter for preprocedural laboratory examination: Secondary | ICD-10-CM | POA: Insufficient documentation

## 2014-12-07 DIAGNOSIS — J449 Chronic obstructive pulmonary disease, unspecified: Secondary | ICD-10-CM | POA: Diagnosis not present

## 2014-12-07 HISTORY — DX: Unspecified cataract: H26.9

## 2014-12-07 HISTORY — DX: Pure hyperglyceridemia: E78.1

## 2014-12-07 LAB — CBC WITH DIFFERENTIAL/PLATELET
BASOS ABS: 0 10*3/uL (ref 0.0–0.1)
BASOS PCT: 0 % (ref 0–1)
EOS PCT: 1 % (ref 0–5)
Eosinophils Absolute: 0.1 10*3/uL (ref 0.0–0.7)
HEMATOCRIT: 48.7 % — AB (ref 36.0–46.0)
HEMOGLOBIN: 17.1 g/dL — AB (ref 12.0–15.0)
LYMPHS ABS: 1.8 10*3/uL (ref 0.7–4.0)
Lymphocytes Relative: 28 % (ref 12–46)
MCH: 32.5 pg (ref 26.0–34.0)
MCHC: 35.1 g/dL (ref 30.0–36.0)
MCV: 92.6 fL (ref 78.0–100.0)
MONO ABS: 0.5 10*3/uL (ref 0.1–1.0)
Monocytes Relative: 8 % (ref 3–12)
Neutro Abs: 4.1 10*3/uL (ref 1.7–7.7)
Neutrophils Relative %: 63 % (ref 43–77)
Platelets: 161 10*3/uL (ref 150–400)
RBC: 5.26 MIL/uL — AB (ref 3.87–5.11)
RDW: 14.6 % (ref 11.5–15.5)
WBC: 6.5 10*3/uL (ref 4.0–10.5)

## 2014-12-07 LAB — BASIC METABOLIC PANEL
Anion gap: 10 (ref 5–15)
BUN: 8 mg/dL (ref 6–23)
CHLORIDE: 102 mmol/L (ref 96–112)
CO2: 27 mmol/L (ref 19–32)
Calcium: 9.9 mg/dL (ref 8.4–10.5)
Creatinine, Ser: 0.63 mg/dL (ref 0.50–1.10)
GFR calc non Af Amer: >90 mL/min (ref >90)
Glucose, Bld: 85 mg/dL (ref 70–99)
POTASSIUM: 4 mmol/L (ref 3.5–5.1)
SODIUM: 139 mmol/L (ref 135–145)

## 2014-12-07 LAB — SURGICAL PCR SCREEN
MRSA, PCR: NEGATIVE
Staphylococcus aureus: NEGATIVE

## 2014-12-07 NOTE — Progress Notes (Signed)
Patient has had some occasional chest discomfort, but thinks more or less its nerve related.  Saw Drs. Hochrein & DeGent in [place] back in 2011.  Had echo and stress tests done in 2011.  Heart cath in 2006.  Sleep study in 2011. PCP is Kirstie Perishish Shah in [place]. She will bring CPAP mask DOS.

## 2014-12-10 ENCOUNTER — Encounter (HOSPITAL_COMMUNITY): Payer: Self-pay

## 2014-12-10 NOTE — Progress Notes (Addendum)
Anesthesia Chart Review: Patient is a 69 year old female scheduled for C5-6, C6-7 ACDF on 12/14/14 by Dr. Jordan Likes.  History includes smoking (currently ~ 1 1/2 ppd), DM2, hypothyroidism, COPD, OSA with CPAP and 2L/O2, anxiety, HTN, murmur (trace MR 03/2010 echo), fibromyalgia, hypercholesterolemia/hypertriglyceridemia, hysterectomy, L3-5 PLIF 10/13/11 and left L2-3 XLIF 09/2012. She reported hypotension in PACU and welts from an undetermined source from her surgery ~ 2010, and feels she is difficult to wake up after anesthesia.BMI is consistent with obesity. PCP is Dr. Kirstie Peri in [place] whom she sees every three months for DM.  Meds include albuterol, amlodipine, Klonopin, Cymbalta, Lasix, levothyroxine, losartan, metformin, Toprol XL, MS Contin, Prilosec, K-dur, Pravachol, Lyrica, trazodone.  She has been evaluated by Cardiologist Dr. Antoine Poche in the past for chest pain in July 2011, and a stress test and echo were ordered (see below). He last saw her in August of 2011. I called and spoke with patient.  She reports that she still has intermittent chest discomfort, typically once a week in mid chest. Feels like "pain" or knot.  Sometimes occurs after meals, sometimes at rest.  It is not brought on by exercise or worsened by activity, although she admits that she is not able to be very active due to continued back pain and sometimes SOB.  She cannot vacuum or sweep.  She can do laundry and can do shopping with shopping basket support without need for frequent stops. She denies SOB at rest, but has chronic DOE which she feels is stable for at least the past year.  She gets occasional mild BLE edema with prolonged standing. Chest discomfort typically lasts a few minutes.  She will sometimes take ASA. She is unsure if the ASA helps or if the pain just subsides on its own. is not radiating or associated with nausea, diaphoresis, palpitations, syncope.  She feels symptoms are stable for the past year. Her parents died  when she was young, so she is unsure about their cardiac history.  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  She has an estranged younger brother (~ 6 years younger) that required some sort of cardiac surgery, but she is unsure about the specifics.  She had a stress test at [facility] on 04/24/10 that showed probably normal LV perfusion. Global LV systolic function was normal, EF 66%, normal wall motion, small, fixed apical defect consistent with attenuation artifact. Echo on that same day showed EF 60-65%, normal LV systolic function, trace MR. Carotid duplex showed no right ICA stenosis and < 50% left ICA stenosis. [facility] records are scanned under the Media tab under Correspondence dated 10/15/11.)  Her last cath was done in 2006 and showed:  1. Hemodynamics: Left ventricular pressure 119/8 mmHg, arterial pressure 119/70 mmHg.  2. Ventriculography: Ejection fraction 60%, no wall motion abnormalities.  3. Selective coronary angiography: The left main coronary artery was a large caliber vessel with no evidence of flow limiting disease. 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 The diagonal branch was free of flow limiting disease. The circumflex coronary  artery had a proximal 30% stenosis. The marginal branches were free of flow-limiting disease. Right coronary artery is a large caliber vessel with some narrowing of approximately 20 to 30% in the midsection of the vessel.   12/07/14 EKG: NSR with sinus arrhythmia.  Preoperative labs reviewed. H/H 17.1/48.7.  BMET WNL.   She is having  weekly chest discomfort, although she thinks it's probably stable for the past year.  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  Discussed with anesthesiologist Dr. Renold DonGermeroth.  Recommend preoperative cardiology evaluation.  I have  notified Erie NoeVanessa at Dr. Lindalou HosePool's office.  She will arrange appointment and let patient know.    Velna Ochsllison Zelenak, PA-C [facility]/Anesthesiology Phone [phone] 12/10/2014 1:07 PM   Addendum: Patient seen at [facility] yesterday.  The below tests were ordered and based on results Dr. Wyline MoodBranch has cleared patient for surgery.  Nuclear stress test on 12/13/14: IMPRESSION: 1. No reversible ischemia or infarction. 2. Normal left ventricular wall motion. 3. Left ventricular ejection fraction 60% 4. Low-risk stress test findings*. ADDENDUM: Second review of images shows moderate sized mild intensity inferior wall defect mildly more intense in post-injection images. The inferior wall has normal wall motion. There is noted radiotracer uptake in the adjacent gut that is more prominent in the postinjection images compared to the resting images that likely explains this finding as well as some subdiaphragmatic attenuation. Remains overall low risk study. Electronically Signed By: Dina RichJonathan Branch On: 12/13/2014 16:18  Echo on 12/13/14: - Left ventricle: The cavity size was normal. Wall thickness was normal. Systolic function was normal. The estimated ejection fraction was in the range of 60% to 65%. Features are consistent with a pseudonormal left ventricular filling pattern, with concomitant abnormal relaxation and increased filling pressure (grade 2 diastolic dysfunction). - Aortic valve: Mildly calcified annulus. Normal thickness leaflets. Valve area (VTI): 2.3 cm^2. Valve area (Vmax): 2.35 cm^2. - Mitral valve: Mildly calcified annulus. Normal thickness leaflets. - Left atrium: The atrium was mildly dilated. - Right ventricle: The cavity size was mildly dilated. Systolic function was normal. RV TAPSE is 2.9 cm. - Right atrium: The atrium was mildly dilated. - Atrial septum: No defect or patent foramen ovale was identified. - Pericardium, extracardiac:  There is a trivial circumferential pericardial effusion. - Technically difficult study.  12/13/14 carotid duplex:  IMPRESSION: 1. Bilateral carotid bifurcation and proximal ICA plaque, resulting in less than 50% diameter stenosis. The exam does not exclude plaque ulceration or embolization. Continued surveillance recommended. 2. 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  Velna Ochsllison Zelenak, PA-C [facility]est[facility]iology Phone [phone]) [phone] 12/13/2014 4:24 PM

## 2014-12-12 ENCOUNTER — Encounter: Payer: Self-pay | Admitting: Physician Assistant

## 2014-12-12 ENCOUNTER — Encounter: Payer: Self-pay | Admitting: *Deleted

## 2014-12-12 ENCOUNTER — Ambulatory Visit (INDEPENDENT_AMBULATORY_CARE_PROVIDER_SITE_OTHER): Payer: Medicare Other | Admitting: Physician Assistant

## 2014-12-12 VITALS — BP 124/78 | HR 73 | Ht 61.0 in | Wt 182.0 lb

## 2014-12-12 DIAGNOSIS — Z136 Encounter for screening for cardiovascular disorders: Secondary | ICD-10-CM | POA: Diagnosis not present

## 2014-12-12 DIAGNOSIS — R0789 Other chest pain: Secondary | ICD-10-CM

## 2014-12-12 DIAGNOSIS — I10 Essential (primary) hypertension: Secondary | ICD-10-CM | POA: Diagnosis not present

## 2014-12-12 DIAGNOSIS — Z01818 Encounter for other preprocedural examination: Secondary | ICD-10-CM | POA: Insufficient documentation

## 2014-12-12 NOTE — Assessment & Plan Note (Signed)
Weight loss recommended. Exercise program such as swimming recommended.

## 2014-12-12 NOTE — Patient Instructions (Signed)
Your physician recommends that you schedule a follow-up appointment after test.   Your physician recommends that you continue on your current medications as directed. Please refer to the Current Medication list given to you today.  Your physician has requested that you have an echocardiogram. Echocardiography is a painless test that uses sound waves to create images of your heart. It provides your doctor with information about the size and shape of your heart and how well your heart's chambers and valves are working. This procedure takes approximately one hour. There are no restrictions for this procedure.  Your physician has requested that you have a carotid duplex. This test is an ultrasound of the carotid arteries in your neck. It looks at blood flow through these arteries that supply the brain with blood. Allow one hour for this exam. There are no restrictions or special instructions.  Your physician has requested that you have a lexiscan myoview. For further information please visit [email or web address]. Please follow instruction sheet, as given.  Thank you for choosing [facility]!

## 2014-12-12 NOTE — Assessment & Plan Note (Signed)
Smoking cessation discussed the patient has no intent on quitting. Recommend referral to [facility].

## 2014-12-12 NOTE — Assessment & Plan Note (Signed)
History of mild MR. Repeat 2-D echo.

## 2014-12-12 NOTE — Assessment & Plan Note (Signed)
Patient is here today for preoperative clearance prior to undergoing cervical neck surgery by Dr. pull this Friday. She has not been seen in our office since 2011. She has multiple cardiac risk factors including hypertension, diabetes mellitus, hyperlipidemia, ongoing tobacco abuse. She has some atypical chest pain that occurs at rest. She does have a heart murmur, carotid bruits and is very sedentary. She has a history of nonobstructive CAD on cath in 2006 and no ischemia on stress test in 2011. Recommend Lexi scan Myoview, 2-D echo for heart murmur, and carotid Dopplers. Advised her to quit smoking. We'll try to obtain these tests tomorrow in hopes that she can proceed with surgery Friday.

## 2014-12-12 NOTE — Assessment & Plan Note (Signed)
Patient has atypical chest pain that does sound more like indigestion. She does have multiple cardiac risk factors for CAD. Recommend Lexi scan Myoview to rule out ischemia.

## 2014-12-12 NOTE — Progress Notes (Signed)
Cardiology Office Note   Date:  12/12/2014   ID:  Patricia Stein, DOB [birth year], MRN [id number]  PCP:  Kirstie Peri, MD  Cardiologist:  Previously saw Dr. Antoine Poche. Wants to be established in the [facility] office.  Chief Complaint: Presurgical clearance    History of Present Illness: Patricia Stein is a 69 y.o. female who presents for preoperative clearance before undergoing cervical neck decompression/discectomy surgery by Dr. Dutch Quint this Friday. Patient has [facility] sleep been seen by Dr.Hochrein, but not since 2011. She has a history of nonobstructive CAD on cath in 2006. Stress test in 2011 at [facility] showed normal LV perfusion EF 66% with a small fixed apical defect consistent with attenuation artifact. Echo that same day EF was 60-65% normal LV systolic function trace of MR. Carotid duplex less than 50% left ICA stenosis no right ICA stenosis.  Patient has a history of hypertension, diabetes mellitus, hyperlipidemia, and smokes 1-1/2 packs of cigarettes daily. She has COPD, OSA with C Pap and 2 L of O2. She is overweight and does not exercise at all because of 6 prior back surgeries. She is trying to quit smoking but has severe anxiety. She has reported hyotension in PACU and welts from an under determine source from a surgery in 2010.  Patient complains of a knot in the center of her chest that she feels is secondary to indigestion. She says it usually occurs at rest and is relieved with belching. She says it's worse when she eats Cheerios or in the evening when she's sitting down. She has no exertional chest tightness or pressure, dizziness or presyncope. She has chronic dyspnea on exertion secondary to her smoking. She has no history of stroke or heart failure. Her blood pressure has been controlled with Toprol, Lasix, Norvasc, losartan.  Past Medical History  Diagnosis Date  . Diabetes mellitus   . Hypothyroidism   . COPD (chronic obstructive pulmonary disease)   . Shortness of  breath   . Anxiety   . Hypercholesteremia     pt reports that her levels are really high  . GERD (gastroesophageal reflux disease)   . Fibromyalgia   . Complication of anesthesia     pt reports with ~ 2010 they had trouble with her BP and getting it up she went to recovery room with welps on her unsure  what cause reaction  . Cataracts, both eyes   . Hypertension     stress test done in 2011, last seen heart MD in 2011  . Heart murmur     small per pt see [facility] cardiolgist last in 2011 (trace MR)  . Sleep apnea     uses CPAP with oxygen sleep study done ~ 2 years ago in [facility] at [facility]  . Hypertriglyceridemia     Past Surgical History  Procedure Laterality Date  . Abdominal hysterectomy    . Carpal tunnel release    . Tonsillectomy    . Tubal ligation    . Back surgery      x6  . Anterior lat lumbar fusion  10/14/2012    Procedure: ANTERIOR LATERAL LUMBAR FUSION 1 LEVEL;  Surgeon: Temple Pacini, MD;  Location: [facility];  Service: Neurosurgery;  Laterality: Left;     Current Outpatient Prescriptions  Medication Sig Dispense Refill  . albuterol (PROVENTIL HFA;VENTOLIN HFA) 108 (90 BASE) MCG/ACT inhaler Inhale 1 puff into the lungs every 6 (six) hours as needed for wheezing or shortness of breath.     Marland Kitchen  Alum & Mag Hydroxide-Simeth (MAGIC MOUTHWASH) SOLN Take 10 mLs by mouth 2 (two) times daily as needed (Sore mouth.). Swish and swallow. 100 mL 0  . amLODipine (NORVASC) 5 MG tablet Take 5 mg by mouth daily.    . clonazePAM (KLONOPIN) 0.5 MG tablet Take 0.5 mg by mouth 2 (two) times daily.    . diphenhydrAMINE (BENADRYL) 25 mg capsule Take 1 capsule (25 mg total) by mouth every 6 (six) hours as needed for itching. 30 capsule 0  . DULoxetine (CYMBALTA) 60 MG capsule Take 60 mg by mouth 2 (two) times daily.     . furosemide (LASIX) 40 MG tablet Take 40 mg by mouth every morning.      . hydrocortisone 1 % lotion Apply topically 4 (four) times daily as needed. Apply to  affected area as needed for itching 118 mL 0  . levothyroxine (SYNTHROID, LEVOTHROID) 112 MCG tablet Take 112 mcg by mouth daily before breakfast.    . losartan (COZAAR) 100 MG tablet Take 100 mg by mouth daily.    . metFORMIN (GLUMETZA) 500 MG (MOD) 24 hr tablet Take 500 mg by mouth 3 (three) times daily.    . metoprolol succinate (TOPROL-XL) 100 MG 24 hr tablet Take 100 mg by mouth daily. Take with or immediately following a meal.    . morphine (MS CONTIN) 15 MG 12 hr tablet Take 15 mg by mouth 3 (three) times daily. Takes with ms contin 30mg     . morphine (MS CONTIN) 30 MG 12 hr tablet Take 30 mg by mouth 3 (three) times daily. Takes with ms contin 15mg     . omeprazole (PRILOSEC) 20 MG capsule Take 20 mg by mouth daily as needed (acid reflux).     . polyethylene glycol (MIRALAX / GLYCOLAX) packet Take 17 g by mouth daily as needed. For constipation    . potassium chloride (K-DUR) 10 MEQ tablet Take 10 mEq by mouth every morning.      . pravastatin (PRAVACHOL) 20 MG tablet Take 20 mg by mouth daily.    . pregabalin (LYRICA) 150 MG capsule Take 150 mg by mouth 2 (two) times daily.      . traZODone (DESYREL) 50 MG tablet Take 50 mg by mouth at bedtime.     No current facility-administered medications for this visit.    Allergies:   Tape    Social History:  The patient  reports that she has been smoking Cigarettes.  She started smoking about 39 years ago. She has a 55.5 pack-year smoking history. She has never used smokeless tobacco. She reports that she does not drink alcohol or use illicit drugs.   Family History:  The patient's    family history is negative for Anesthesia problems. most her family died at a young age but not of heart disease   ROS:  Please see the history of present illness.   Otherwise, review of systems are positive for chronic back pain which limits her.   All other systems are reviewed and negative.    PHYSICAL EXAM: VS:  BP 124/78 mmHg  Pulse 73  Ht 5\' 1"  (1.549  m)  Wt 182 lb (82.555 kg)  BMI 34.41 kg/m2  SpO2 95% , BMI Body mass index is 34.41 kg/(m^2). GEN: Obese, well developed, in no acute distress HEENT: normal Neck: no JVD, HJR, carotid bruits, or masses Cardiac: RRR; 2/6 systolic murmur at the left sternal border and apex, no gallop, rubs, thrill or heave,no edema,   Respiratory:  clear to auscultation bilaterally, normal work of breathing GI: Obese ,soft, nontender, nondistended, + BS MS: no deformity or atrophy Extremities: without cyanosis, clubbing, edema, good distal pulses bilaterally.  Skin: warm and dry, no rash Neuro:  Strength and sensation are intact Psych: euthymic mood, full affect   EKG:  EKG is ordered today. The ekg ordered today demonstrates normal sinus rhythm, normal EKG   Recent Labs: 12/07/2014: BUN 8; Creatinine 0.63; Hemoglobin 17.1*; Platelets 161; Potassium 4.0; Sodium 139    Lipid Panel No results found for: CHOL, TRIG, HDL, CHOLHDL, VLDL, LDLCALC, LDLDIRECT    Wt Readings from Last 3 Encounters:  12/12/14 182 lb (82.555 kg)  11/01/12 180 lb 8.9 oz (81.9 kg)  10/15/12 177 lb 4.8 oz (80.423 kg)      Other studies Reviewed: Additional studies/ records that were reviewed today include and review of the records demonstrates:   FINDINGS:  1.  Hemodynamics:  Left ventricular pressure 119/8 mmHg, arterial pressure      119/70 mmHg.  2.  Ventriculography:  Ejection fraction 60%, no wall motion abnormalities.  3.  Selective coronary angiography:  The left main coronary artery was a      large caliber vessel with no evidence of flow limiting disease.  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  The diagonal      branch was free of flow limiting disease.  The circumflex coronary      artery had a proximal 30% stenosis.  The marginal branches were free of      flow-limiting disease.  Right coronary artery is a  large caliber vessel      with some narrowing of approximately 20 to 30% in the midsection of the      vessel.    RECOMMENDATIONS:  Angiographic images were reviewed and relayed to Dr. Sherryll Burger.  The patient will be switched from Lisinopril to an ARB secondary to cough.  Continue medical therapy for hypertriglyceridemia and hypercholesterolemia  as indicated.      Stress test in 2011 at [facility] showed normal LV perfusion EF 66% with a small fixed apical defect consistent with attenuation artifact. Echo that same day EF was 60-65% normal LV systolic function trace of MR. Carotid duplex less than 50% left ICA stenosis no right ICA stenosis.    ASSESSMENT AND PLAN: Preoperative clearance Patient is here today for preoperative clearance prior to undergoing cervical neck surgery by Dr. pull this Friday. She has not been seen in our office since 2011. She has multiple cardiac risk factors including hypertension, diabetes mellitus, hyperlipidemia, ongoing tobacco abuse. She has some atypical chest pain that occurs at rest. She does have a heart murmur, carotid bruits and is very sedentary. She has a history of nonobstructive CAD on cath in 2006 and no ischemia on stress test in 2011. Recommend Lexi scan Myoview, 2-D echo for heart murmur, and carotid Dopplers. Advised her to quit smoking. We'll try to obtain these tests tomorrow in hopes that she can proceed with surgery Friday.   Tobacco use disorder Smoking cessation discussed the patient has no intent on quitting. Recommend referral to [facility].   CAROTID BRUIT Check carotid Dopplers   Chest pain Patient has atypical chest pain that does sound more like indigestion. She does have multiple cardiac risk factors for CAD. Recommend Lexi scan Myoview to  rule out ischemia.   HTN (hypertension) Blood pressure controlled   MURMUR History of mild MR. Repeat 2-D echo.   Obesity Weight loss recommended. Exercise program  such as swimming recommended.      Signed, Jacolyn Reedy, PA-C  12/12/2014 2:23 PM    [facility] [address] [place], [place], [place]  [address] Phone: [phone]; Fax: [phone]

## 2014-12-12 NOTE — Assessment & Plan Note (Signed)
Check carotid Dopplers °

## 2014-12-12 NOTE — Assessment & Plan Note (Signed)
Blood pressure controlled. 

## 2014-12-13 ENCOUNTER — Encounter (HOSPITAL_COMMUNITY)
Admission: RE | Admit: 2014-12-13 | Discharge: 2014-12-13 | Disposition: A | Discharge status: Home / Self Care | Payer: Medicare Other | Source: Ambulatory Visit | Attending: Physician Assistant | Admitting: Physician Assistant

## 2014-12-13 ENCOUNTER — Ambulatory Visit (HOSPITAL_COMMUNITY)
Admission: RE | Admit: 2014-12-13 | Discharge: 2014-12-13 | Disposition: A | Discharge status: Home / Self Care | Payer: Medicare Other | Source: Ambulatory Visit | Attending: Physician Assistant | Admitting: Physician Assistant

## 2014-12-13 ENCOUNTER — Encounter (HOSPITAL_COMMUNITY): Payer: Self-pay

## 2014-12-13 ENCOUNTER — Other Ambulatory Visit (HOSPITAL_COMMUNITY): Payer: Medicare Other

## 2014-12-13 ENCOUNTER — Ambulatory Visit: Payer: Medicare Other | Admitting: Cardiology

## 2014-12-13 DIAGNOSIS — R079 Chest pain, unspecified: Secondary | ICD-10-CM | POA: Insufficient documentation

## 2014-12-13 DIAGNOSIS — Z01818 Encounter for other preprocedural examination: Secondary | ICD-10-CM

## 2014-12-13 DIAGNOSIS — I2510 Atherosclerotic heart disease of native coronary artery without angina pectoris: Secondary | ICD-10-CM | POA: Insufficient documentation

## 2014-12-13 DIAGNOSIS — R0789 Other chest pain: Secondary | ICD-10-CM

## 2014-12-13 DIAGNOSIS — Z136 Encounter for screening for cardiovascular disorders: Secondary | ICD-10-CM

## 2014-12-13 HISTORY — DX: Unspecified asthma, uncomplicated: J45.909

## 2014-12-13 MED ORDER — TECHNETIUM TC 99M SESTAMIBI - CARDIOLITE
10.0000 | Freq: Once | INTRAVENOUS | Status: AC | PRN
  Administered 2014-12-13: 07:00:00 10 via INTRAVENOUS

## 2014-12-13 MED ORDER — SODIUM CHLORIDE 0.9 % IJ SOLN
10.0000 mL | INTRAMUSCULAR | Status: DC | PRN
  Administered 2014-12-13: 10 mL via INTRAVENOUS
  Filled 2014-12-13: qty 10

## 2014-12-13 MED ORDER — SODIUM CHLORIDE 0.9 % IJ SOLN
INTRAMUSCULAR | Status: AC
  Administered 2014-12-13: 10 mL via INTRAVENOUS
  Filled 2014-12-13: qty 3

## 2014-12-13 MED ORDER — CEFAZOLIN SODIUM-DEXTROSE 2-3 GM-% IV SOLR
2.0000 g | INTRAVENOUS | Status: AC
  Administered 2014-12-14: 2 g via INTRAVENOUS
  Filled 2014-12-13: qty 50

## 2014-12-13 MED ORDER — TECHNETIUM TC 99M SESTAMIBI GENERIC - CARDIOLITE
30.0000 | Freq: Once | INTRAVENOUS | Status: AC | PRN
  Administered 2014-12-13: 30 via INTRAVENOUS

## 2014-12-13 MED ORDER — REGADENOSON 0.4 MG/5ML IV SOLN
INTRAVENOUS | Status: AC
  Administered 2014-12-13: 0.4 mg via INTRAVENOUS
  Filled 2014-12-13: qty 5

## 2014-12-13 MED ORDER — DEXAMETHASONE SODIUM PHOSPHATE 10 MG/ML IJ SOLN
10.0000 mg | INTRAMUSCULAR | Status: AC
  Administered 2014-12-14: 10 mg via INTRAVENOUS
  Filled 2014-12-13: qty 1

## 2014-12-13 MED ORDER — REGADENOSON 0.4 MG/5ML IV SOLN
0.4000 mg | Freq: Once | INTRAVENOUS | Status: AC | PRN
  Administered 2014-12-13: 0.4 mg via INTRAVENOUS

## 2014-12-13 NOTE — Progress Notes (Signed)
  Echocardiogram 2D Echocardiogram has been performed.  Stacey DrainWhite, Perle Gibbon J 12/13/2014, 11:32 AM

## 2014-12-13 NOTE — Progress Notes (Signed)
Stress Lab Nurses Notes - Patricia Stein  Judith BlonderCarolyn T Whidby 12/13/2014 Reason for doing test: CAD, Chest Pain and Surgical Clearance Type of test: Marlane HatcherLexiscan Cardiol[facility] Nurse performing test: Parke PoissonPhyllis Billingsly, RN Nuclear Medicine Tech: Lyndel Pleasureyan Liles Echo Tech: Not Applicable MD performing test: Branch/K.Lyman BishopLawrence NP Family MD: Shah/Pool Test explained and consent signed: Yes.   IV started: Saline lock flushed, No redness or edema and Saline lock started in radiology Symptoms: chest tightness & SOB Treatment/Intervention: None Reason test stopped: protocol completed After recovery IV was: Discontinued via X-ray tech and No redness or edema Patient to return to Nuc. Med at : 9:30 Patient discharged: Home Patient's Condition upon discharge was: stable Comments: During test BP 115/66 & HR 75.  Recovery BP 117/67 & HR 68 .  Symptoms resolved in recovery. Erskine SpeedBillingsley, Shatoya Roets T

## 2014-12-14 ENCOUNTER — Inpatient Hospital Stay (HOSPITAL_COMMUNITY): Payer: Medicare Other | Admitting: Anesthesiology

## 2014-12-14 ENCOUNTER — Inpatient Hospital Stay (HOSPITAL_COMMUNITY)
Admission: RE | Admit: 2014-12-14 | Discharge: 2014-12-15 | DRG: 473 | Disposition: A | Discharge status: Home / Self Care | Payer: Medicare Other | Source: Ambulatory Visit | Attending: Neurosurgery | Admitting: Neurosurgery

## 2014-12-14 ENCOUNTER — Inpatient Hospital Stay (HOSPITAL_COMMUNITY): Payer: Medicare Other

## 2014-12-14 ENCOUNTER — Inpatient Hospital Stay (HOSPITAL_COMMUNITY): Payer: Medicare Other | Admitting: Vascular Surgery

## 2014-12-14 ENCOUNTER — Encounter (HOSPITAL_COMMUNITY)
Admission: RE | Disposition: A | Discharge status: Home / Self Care | Payer: Self-pay | Source: Ambulatory Visit | Attending: Neurosurgery

## 2014-12-14 ENCOUNTER — Encounter (HOSPITAL_COMMUNITY): Payer: Self-pay | Admitting: *Deleted

## 2014-12-14 DIAGNOSIS — J45909 Unspecified asthma, uncomplicated: Secondary | ICD-10-CM | POA: Diagnosis present

## 2014-12-14 DIAGNOSIS — Z90710 Acquired absence of both cervix and uterus: Secondary | ICD-10-CM

## 2014-12-14 DIAGNOSIS — E780 Pure hypercholesterolemia: Secondary | ICD-10-CM | POA: Diagnosis present

## 2014-12-14 DIAGNOSIS — Z79891 Long term (current) use of opiate analgesic: Secondary | ICD-10-CM

## 2014-12-14 DIAGNOSIS — Z981 Arthrodesis status: Secondary | ICD-10-CM | POA: Diagnosis not present

## 2014-12-14 DIAGNOSIS — R011 Cardiac murmur, unspecified: Secondary | ICD-10-CM | POA: Diagnosis present

## 2014-12-14 DIAGNOSIS — E119 Type 2 diabetes mellitus without complications: Secondary | ICD-10-CM | POA: Diagnosis present

## 2014-12-14 DIAGNOSIS — G4730 Sleep apnea, unspecified: Secondary | ICD-10-CM | POA: Diagnosis present

## 2014-12-14 DIAGNOSIS — E039 Hypothyroidism, unspecified: Secondary | ICD-10-CM | POA: Diagnosis present

## 2014-12-14 DIAGNOSIS — E781 Pure hyperglyceridemia: Secondary | ICD-10-CM | POA: Diagnosis present

## 2014-12-14 DIAGNOSIS — K219 Gastro-esophageal reflux disease without esophagitis: Secondary | ICD-10-CM | POA: Diagnosis present

## 2014-12-14 DIAGNOSIS — J449 Chronic obstructive pulmonary disease, unspecified: Secondary | ICD-10-CM | POA: Diagnosis present

## 2014-12-14 DIAGNOSIS — F17210 Nicotine dependence, cigarettes, uncomplicated: Secondary | ICD-10-CM | POA: Diagnosis present

## 2014-12-14 DIAGNOSIS — F419 Anxiety disorder, unspecified: Secondary | ICD-10-CM | POA: Diagnosis present

## 2014-12-14 DIAGNOSIS — M797 Fibromyalgia: Secondary | ICD-10-CM | POA: Diagnosis present

## 2014-12-14 DIAGNOSIS — I10 Essential (primary) hypertension: Secondary | ICD-10-CM | POA: Diagnosis present

## 2014-12-14 DIAGNOSIS — Z79899 Other long term (current) drug therapy: Secondary | ICD-10-CM

## 2014-12-14 DIAGNOSIS — M5012 Cervical disc disorder with radiculopathy, mid-cervical region: Secondary | ICD-10-CM | POA: Diagnosis present

## 2014-12-14 DIAGNOSIS — M4722 Other spondylosis with radiculopathy, cervical region: Secondary | ICD-10-CM | POA: Diagnosis present

## 2014-12-14 DIAGNOSIS — M5020 Other cervical disc displacement, unspecified cervical region: Secondary | ICD-10-CM

## 2014-12-14 HISTORY — PX: ANTERIOR CERVICAL DECOMP/DISCECTOMY FUSION: SHX1161

## 2014-12-14 LAB — GLUCOSE, CAPILLARY
GLUCOSE-CAPILLARY: 231 mg/dL — AB (ref 70–99)
Glucose-Capillary: 163 mg/dL — ABNORMAL HIGH (ref 70–99)
Glucose-Capillary: 151 mg/dL — ABNORMAL HIGH (ref 70–99)

## 2014-12-14 SURGERY — ANTERIOR CERVICAL DECOMPRESSION/DISCECTOMY FUSION 2 LEVELS
Anesthesia: General | Site: Spine Cervical

## 2014-12-14 MED ORDER — PANTOPRAZOLE SODIUM 40 MG PO TBEC
40.0000 mg | DELAYED_RELEASE_TABLET | Freq: Every day | ORAL | Status: DC
  Administered 2014-12-15: 40 mg via ORAL
  Filled 2014-12-14: qty 1

## 2014-12-14 MED ORDER — NEOSTIGMINE METHYLSULFATE 10 MG/10ML IV SOLN
INTRAVENOUS | Status: AC
  Filled 2014-12-14: qty 1

## 2014-12-14 MED ORDER — ROCURONIUM BROMIDE 100 MG/10ML IV SOLN
INTRAVENOUS | Status: DC | PRN
  Administered 2014-12-14: 40 mg via INTRAVENOUS

## 2014-12-14 MED ORDER — MENTHOL 3 MG MT LOZG: 1.0000 | LOZENGE | OROMUCOSAL | Status: DC | PRN

## 2014-12-14 MED ORDER — CYCLOBENZAPRINE HCL 10 MG PO TABS
10.0000 mg | ORAL_TABLET | Freq: Three times a day (TID) | ORAL | Status: DC | PRN
  Administered 2014-12-15: 10 mg via ORAL
  Filled 2014-12-14: qty 1

## 2014-12-14 MED ORDER — MORPHINE SULFATE ER 15 MG PO TBCR
45.0000 mg | EXTENDED_RELEASE_TABLET | Freq: Three times a day (TID) | ORAL | Status: DC
  Administered 2014-12-14 – 2014-12-15 (×3): 45 mg via ORAL
  Filled 2014-12-14 (×3): qty 3

## 2014-12-14 MED ORDER — SUCCINYLCHOLINE CHLORIDE 20 MG/ML IJ SOLN
INTRAMUSCULAR | Status: AC
  Filled 2014-12-14: qty 1

## 2014-12-14 MED ORDER — FENTANYL CITRATE 0.05 MG/ML IJ SOLN
INTRAMUSCULAR | Status: AC
  Filled 2014-12-14: qty 5

## 2014-12-14 MED ORDER — ALBUTEROL SULFATE (2.5 MG/3ML) 0.083% IN NEBU: 3.0000 mL | INHALATION_SOLUTION | Freq: Four times a day (QID) | RESPIRATORY_TRACT | Status: DC | PRN

## 2014-12-14 MED ORDER — AMLODIPINE BESYLATE 5 MG PO TABS
5.0000 mg | ORAL_TABLET | Freq: Every day | ORAL | Status: DC
  Filled 2014-12-14: qty 1

## 2014-12-14 MED ORDER — VECURONIUM BROMIDE 10 MG IV SOLR
INTRAVENOUS | Status: AC
  Filled 2014-12-14: qty 10

## 2014-12-14 MED ORDER — ONDANSETRON HCL 4 MG/2ML IJ SOLN
INTRAMUSCULAR | Status: AC
  Filled 2014-12-14: qty 2

## 2014-12-14 MED ORDER — MORPHINE SULFATE CR 15 MG PO TB12: 15.0000 mg | ORAL_TABLET | Freq: Three times a day (TID) | ORAL | Status: DC

## 2014-12-14 MED ORDER — BISACODYL 10 MG RE SUPP: 10.0000 mg | Freq: Every day | RECTAL | Status: DC | PRN

## 2014-12-14 MED ORDER — ONDANSETRON HCL 4 MG/2ML IJ SOLN
INTRAMUSCULAR | Status: DC | PRN
  Administered 2014-12-14: 4 mg via INTRAVENOUS

## 2014-12-14 MED ORDER — MORPHINE SULFATE CR 30 MG PO TB12: 30.0000 mg | ORAL_TABLET | Freq: Three times a day (TID) | ORAL | Status: DC

## 2014-12-14 MED ORDER — ONDANSETRON HCL 4 MG/2ML IJ SOLN: 4.0000 mg | INTRAMUSCULAR | Status: DC | PRN

## 2014-12-14 MED ORDER — ARTIFICIAL TEARS OP OINT
TOPICAL_OINTMENT | OPHTHALMIC | Status: AC
  Filled 2014-12-14: qty 3.5

## 2014-12-14 MED ORDER — ACETAMINOPHEN 650 MG RE SUPP: 650.0000 mg | RECTAL | Status: DC | PRN

## 2014-12-14 MED ORDER — FUROSEMIDE 40 MG PO TABS
40.0000 mg | ORAL_TABLET | Freq: Every day | ORAL | Status: DC
  Filled 2014-12-14: qty 1

## 2014-12-14 MED ORDER — OXYCODONE-ACETAMINOPHEN 5-325 MG PO TABS
1.0000 | ORAL_TABLET | ORAL | Status: DC | PRN
  Administered 2014-12-15: 2 via ORAL
  Filled 2014-12-14: qty 2

## 2014-12-14 MED ORDER — DEXMEDETOMIDINE HCL IN NACL 200 MCG/50ML IV SOLN
INTRAVENOUS | Status: AC
  Filled 2014-12-14: qty 50

## 2014-12-14 MED ORDER — HEMOSTATIC AGENTS (NO CHARGE) OPTIME
TOPICAL | Status: DC | PRN
  Administered 2014-12-14: 1 via TOPICAL

## 2014-12-14 MED ORDER — PROPOFOL 10 MG/ML IV BOLUS
INTRAVENOUS | Status: DC | PRN
  Administered 2014-12-14: 120 mg via INTRAVENOUS

## 2014-12-14 MED ORDER — LIDOCAINE HCL (CARDIAC) 20 MG/ML IV SOLN
INTRAVENOUS | Status: AC
  Filled 2014-12-14: qty 10

## 2014-12-14 MED ORDER — PREGABALIN 75 MG PO CAPS
150.0000 mg | ORAL_CAPSULE | Freq: Two times a day (BID) | ORAL | Status: DC
  Administered 2014-12-14 – 2014-12-15 (×2): 150 mg via ORAL
  Filled 2014-12-14 (×2): qty 2

## 2014-12-14 MED ORDER — SODIUM CHLORIDE 0.9 % IJ SOLN: 3.0000 mL | Freq: Two times a day (BID) | INTRAMUSCULAR | Status: DC

## 2014-12-14 MED ORDER — FENTANYL CITRATE 0.05 MG/ML IJ SOLN
INTRAMUSCULAR | Status: DC | PRN
  Administered 2014-12-14: 50 ug via INTRAVENOUS
  Administered 2014-12-14: 100 ug via INTRAVENOUS

## 2014-12-14 MED ORDER — METOPROLOL SUCCINATE ER 100 MG PO TB24
100.0000 mg | ORAL_TABLET | Freq: Every day | ORAL | Status: DC
  Administered 2014-12-14: 100 mg via ORAL
  Filled 2014-12-14: qty 1

## 2014-12-14 MED ORDER — ARTIFICIAL TEARS OP OINT
TOPICAL_OINTMENT | OPHTHALMIC | Status: DC | PRN
  Administered 2014-12-14: 1 via OPHTHALMIC

## 2014-12-14 MED ORDER — PRAVASTATIN SODIUM 20 MG PO TABS
20.0000 mg | ORAL_TABLET | Freq: Every day | ORAL | Status: DC
  Administered 2014-12-14: 20 mg via ORAL
  Filled 2014-12-14: qty 1

## 2014-12-14 MED ORDER — CEFAZOLIN SODIUM 1-5 GM-% IV SOLN
1.0000 g | Freq: Three times a day (TID) | INTRAVENOUS | Status: AC
  Administered 2014-12-14 – 2014-12-15 (×2): 1 g via INTRAVENOUS
  Filled 2014-12-14 (×2): qty 50

## 2014-12-14 MED ORDER — CLONAZEPAM 0.5 MG PO TABS
0.5000 mg | ORAL_TABLET | Freq: Two times a day (BID) | ORAL | Status: DC
  Administered 2014-12-14: 0.5 mg via ORAL
  Filled 2014-12-14 (×2): qty 1

## 2014-12-14 MED ORDER — POTASSIUM CHLORIDE ER 10 MEQ PO TBCR
10.0000 meq | EXTENDED_RELEASE_TABLET | Freq: Every day | ORAL | Status: DC
  Administered 2014-12-15: 10 meq via ORAL
  Filled 2014-12-14 (×2): qty 1

## 2014-12-14 MED ORDER — SODIUM CHLORIDE 0.9 % IR SOLN
Status: DC | PRN
  Administered 2014-12-14: 12:00:00

## 2014-12-14 MED ORDER — MIDAZOLAM HCL 5 MG/5ML IJ SOLN
INTRAMUSCULAR | Status: DC | PRN
Start: 2014-12-14 — End: 2014-12-14
  Administered 2014-12-14: 2 mg via INTRAVENOUS

## 2014-12-14 MED ORDER — POLYETHYLENE GLYCOL 3350 17 G PO PACK: 17.0000 g | PACK | Freq: Every day | ORAL | Status: DC | PRN

## 2014-12-14 MED ORDER — MIDAZOLAM HCL 2 MG/2ML IJ SOLN
INTRAMUSCULAR | Status: AC
  Filled 2014-12-14: qty 2

## 2014-12-14 MED ORDER — DULOXETINE HCL 60 MG PO CPEP
60.0000 mg | ORAL_CAPSULE | Freq: Two times a day (BID) | ORAL | Status: DC
  Administered 2014-12-14 – 2014-12-15 (×2): 60 mg via ORAL
  Filled 2014-12-14 (×2): qty 1

## 2014-12-14 MED ORDER — GLYCOPYRROLATE 0.2 MG/ML IJ SOLN
INTRAMUSCULAR | Status: AC
  Filled 2014-12-14: qty 3

## 2014-12-14 MED ORDER — ACETAMINOPHEN 10 MG/ML IV SOLN
INTRAVENOUS | Status: AC
  Administered 2014-12-14: 1000 mg via INTRAVENOUS
  Filled 2014-12-14: qty 100

## 2014-12-14 MED ORDER — SODIUM CHLORIDE 0.9 % IJ SOLN: 3.0000 mL | INTRAMUSCULAR | Status: DC | PRN

## 2014-12-14 MED ORDER — DEXMEDETOMIDINE HCL 200 MCG/2ML IV SOLN
INTRAVENOUS | Status: DC | PRN
  Administered 2014-12-14: 4 ug via INTRAVENOUS
  Administered 2014-12-14 (×2): 8 ug via INTRAVENOUS
  Administered 2014-12-14: 4 ug via INTRAVENOUS

## 2014-12-14 MED ORDER — ACETAMINOPHEN 325 MG PO TABS
650.0000 mg | ORAL_TABLET | ORAL | Status: DC | PRN
  Administered 2014-12-14: 650 mg via ORAL
  Filled 2014-12-14: qty 2

## 2014-12-14 MED ORDER — 0.9 % SODIUM CHLORIDE (POUR BTL) OPTIME
TOPICAL | Status: DC | PRN
  Administered 2014-12-14: 1000 mL

## 2014-12-14 MED ORDER — EPHEDRINE SULFATE 50 MG/ML IJ SOLN
INTRAMUSCULAR | Status: AC
  Filled 2014-12-14: qty 1

## 2014-12-14 MED ORDER — TRAZODONE HCL 50 MG PO TABS
50.0000 mg | ORAL_TABLET | Freq: Every day | ORAL | Status: DC
  Administered 2014-12-14: 50 mg via ORAL
  Filled 2014-12-14: qty 1

## 2014-12-14 MED ORDER — THROMBIN 5000 UNITS EX SOLR
CUTANEOUS | Status: DC | PRN
  Administered 2014-12-14 (×2): 5000 [IU] via TOPICAL

## 2014-12-14 MED ORDER — LACTATED RINGERS IV SOLN
INTRAVENOUS | Status: DC
  Administered 2014-12-14: 10:00:00 via INTRAVENOUS

## 2014-12-14 MED ORDER — ROCURONIUM BROMIDE 50 MG/5ML IV SOLN
INTRAVENOUS | Status: AC
  Filled 2014-12-14: qty 1

## 2014-12-14 MED ORDER — NEOSTIGMINE METHYLSULFATE 10 MG/10ML IV SOLN
INTRAVENOUS | Status: DC | PRN
  Administered 2014-12-14: 4 mg via INTRAVENOUS

## 2014-12-14 MED ORDER — LIDOCAINE HCL (CARDIAC) 20 MG/ML IV SOLN
INTRAVENOUS | Status: DC | PRN
  Administered 2014-12-14: 60 mg via INTRATRACHEAL
  Administered 2014-12-14: 100 mg via INTRAVENOUS

## 2014-12-14 MED ORDER — STERILE WATER FOR INJECTION IJ SOLN
INTRAMUSCULAR | Status: AC
  Filled 2014-12-14: qty 10

## 2014-12-14 MED ORDER — LEVOTHYROXINE SODIUM 112 MCG PO TABS
112.0000 ug | ORAL_TABLET | Freq: Every day | ORAL | Status: DC
  Administered 2014-12-15: 112 ug via ORAL
  Filled 2014-12-14: qty 1

## 2014-12-14 MED ORDER — GLYCOPYRROLATE 0.2 MG/ML IJ SOLN
INTRAMUSCULAR | Status: DC | PRN
Start: 2014-12-14 — End: 2014-12-14
  Administered 2014-12-14: 0.6 mg via INTRAVENOUS

## 2014-12-14 MED ORDER — FLEET ENEMA 7-19 GM/118ML RE ENEM: 1.0000 | ENEMA | Freq: Once | RECTAL | Status: AC | PRN

## 2014-12-14 MED ORDER — FENTANYL CITRATE 0.05 MG/ML IJ SOLN: 25.0000 ug | INTRAMUSCULAR | Status: DC | PRN

## 2014-12-14 MED ORDER — PROPOFOL 10 MG/ML IV BOLUS
INTRAVENOUS | Status: AC
  Filled 2014-12-14: qty 20

## 2014-12-14 MED ORDER — MEPERIDINE HCL 25 MG/ML IJ SOLN: 6.2500 mg | INTRAMUSCULAR | Status: DC | PRN

## 2014-12-14 MED ORDER — SODIUM CHLORIDE 0.9 % IV SOLN: 250.0000 mL | INTRAVENOUS | Status: DC

## 2014-12-14 MED ORDER — PHENOL 1.4 % MT LIQD: 1.0000 | OROMUCOSAL | Status: DC | PRN

## 2014-12-14 MED ORDER — METFORMIN HCL ER 500 MG PO TB24
500.0000 mg | ORAL_TABLET | Freq: Three times a day (TID) | ORAL | Status: DC
  Administered 2014-12-15: 500 mg via ORAL
  Filled 2014-12-14: qty 1

## 2014-12-14 MED ORDER — ALBUTEROL SULFATE HFA 108 (90 BASE) MCG/ACT IN AERS
INHALATION_SPRAY | RESPIRATORY_TRACT | Status: DC | PRN
  Administered 2014-12-14: 2 via RESPIRATORY_TRACT
  Administered 2014-12-14: 6 via RESPIRATORY_TRACT

## 2014-12-14 MED ORDER — LOSARTAN POTASSIUM 50 MG PO TABS
100.0000 mg | ORAL_TABLET | Freq: Every day | ORAL | Status: DC
  Administered 2014-12-14: 100 mg via ORAL
  Filled 2014-12-14 (×2): qty 2

## 2014-12-14 MED ORDER — LACTATED RINGERS IV SOLN
INTRAVENOUS | Status: DC | PRN
  Administered 2014-12-14 (×2): via INTRAVENOUS

## 2014-12-14 MED ORDER — HYDROMORPHONE HCL 1 MG/ML IJ SOLN
0.5000 mg | INTRAMUSCULAR | Status: DC | PRN
  Administered 2014-12-14: 1 mg via INTRAVENOUS
  Filled 2014-12-14: qty 1

## 2014-12-14 MED ORDER — HYDROCODONE-ACETAMINOPHEN 5-325 MG PO TABS: 1.0000 | ORAL_TABLET | ORAL | Status: DC | PRN

## 2014-12-14 MED ORDER — PROMETHAZINE HCL 25 MG/ML IJ SOLN: 6.2500 mg | INTRAMUSCULAR | Status: DC | PRN

## 2014-12-14 SURGICAL SUPPLY — 61 items
BAG DECANTER FOR FLEXI CONT (MISCELLANEOUS) ×3 IMPLANT
BENZOIN TINCTURE PRP APPL 2/3 (GAUZE/BANDAGES/DRESSINGS) ×3 IMPLANT
BRUSH SCRUB EZ PLAIN DRY (MISCELLANEOUS) ×3 IMPLANT
BUR MATCHSTICK NEURO 3.0 LAGG (BURR) ×3 IMPLANT
CAGE PEEK 6X14X11 (Cage) ×4 IMPLANT
CAGE SPNL 11X14X6XRADOPQ (Cage) ×2 IMPLANT
CANISTER SUCT 3000ML PPV (MISCELLANEOUS) ×3 IMPLANT
CLOSURE WOUND 1/2 X4 (GAUZE/BANDAGES/DRESSINGS) ×1
CONT SPEC 4OZ CLIKSEAL STRL BL (MISCELLANEOUS) ×3 IMPLANT
DRAPE C-ARM 42X72 X-RAY (DRAPES) ×6 IMPLANT
DRAPE LAPAROTOMY 100X72 PEDS (DRAPES) ×3 IMPLANT
DRAPE MICROSCOPE LEICA (MISCELLANEOUS) ×3 IMPLANT
DRAPE POUCH INSTRU U-SHP 10X18 (DRAPES) ×3 IMPLANT
DRILL BIT (BIT) ×3 IMPLANT
DURAPREP 6ML APPLICATOR 50/CS (WOUND CARE) ×3 IMPLANT
ELECT COATED BLADE 2.86 ST (ELECTRODE) ×3 IMPLANT
ELECT REM PT RETURN 9FT ADLT (ELECTROSURGICAL) ×3
ELECTRODE REM PT RTRN 9FT ADLT (ELECTROSURGICAL) ×1 IMPLANT
EVACUATOR 1/8 PVC DRAIN (DRAIN) ×3 IMPLANT
GAUZE SPONGE 4X4 12PLY STRL (GAUZE/BANDAGES/DRESSINGS) ×3 IMPLANT
GAUZE SPONGE 4X4 16PLY XRAY LF (GAUZE/BANDAGES/DRESSINGS) IMPLANT
GLOVE BIOGEL PI IND STRL 7.0 (GLOVE) ×4 IMPLANT
GLOVE BIOGEL PI IND STRL 7.5 (GLOVE) ×2 IMPLANT
GLOVE BIOGEL PI INDICATOR 7.0 (GLOVE) ×8
GLOVE BIOGEL PI INDICATOR 7.5 (GLOVE) ×4
GLOVE ECLIPSE 9.0 STRL (GLOVE) ×6 IMPLANT
GLOVE EXAM NITRILE LRG STRL (GLOVE) IMPLANT
GLOVE EXAM NITRILE MD LF STRL (GLOVE) IMPLANT
GLOVE EXAM NITRILE XL STR (GLOVE) IMPLANT
GLOVE EXAM NITRILE XS STR PU (GLOVE) IMPLANT
GLOVE SS BIOGEL STRL SZ 6.5 (GLOVE) ×3 IMPLANT
GLOVE SUPERSENSE BIOGEL SZ 6.5 (GLOVE) ×6
GOWN STRL REUS W/ TWL LRG LVL3 (GOWN DISPOSABLE) ×1 IMPLANT
GOWN STRL REUS W/ TWL XL LVL3 (GOWN DISPOSABLE) ×2 IMPLANT
GOWN STRL REUS W/TWL 2XL LVL3 (GOWN DISPOSABLE) IMPLANT
GOWN STRL REUS W/TWL LRG LVL3 (GOWN DISPOSABLE) ×2
GOWN STRL REUS W/TWL XL LVL3 (GOWN DISPOSABLE) ×4
HALTER HD/CHIN CERV TRACTION D (MISCELLANEOUS) ×3 IMPLANT
HEMOSTAT SURGICEL 2X14 (HEMOSTASIS) IMPLANT
KIT BASIN OR (CUSTOM PROCEDURE TRAY) ×3 IMPLANT
KIT ROOM TURNOVER OR (KITS) ×3 IMPLANT
NEEDLE SPNL 20GX3.5 QUINCKE YW (NEEDLE) ×3 IMPLANT
NS IRRIG 1000ML POUR BTL (IV SOLUTION) ×3 IMPLANT
PACK LAMINECTOMY NEURO (CUSTOM PROCEDURE TRAY) ×3 IMPLANT
PAD ARMBOARD 7.5X6 YLW CONV (MISCELLANEOUS) ×9 IMPLANT
PLATE 23MM (Plate) ×3 IMPLANT
RUBBERBAND STERILE (MISCELLANEOUS) ×6 IMPLANT
SCREW ST 13X4XST VA NS SPNE (Screw) ×6 IMPLANT
SCREW ST VAR 4 ATL (Screw) ×12 IMPLANT
SPONGE INTESTINAL PEANUT (DISPOSABLE) ×3 IMPLANT
SPONGE SURGIFOAM ABS GEL SZ50 (HEMOSTASIS) ×3 IMPLANT
STRIP CLOSURE SKIN 1/2X4 (GAUZE/BANDAGES/DRESSINGS) ×2 IMPLANT
SUT PDS AB 5-0 P3 18 (SUTURE) ×3 IMPLANT
SUT VIC AB 3-0 SH 8-18 (SUTURE) ×3 IMPLANT
SYR 20ML ECCENTRIC (SYRINGE) ×3 IMPLANT
TAPE CLOTH 4X10 WHT NS (GAUZE/BANDAGES/DRESSINGS) ×3 IMPLANT
TAPE CLOTH SURG 4X10 WHT LF (GAUZE/BANDAGES/DRESSINGS) ×3 IMPLANT
TOWEL OR 17X24 6PK STRL BLUE (TOWEL DISPOSABLE) ×3 IMPLANT
TOWEL OR 17X26 10 PK STRL BLUE (TOWEL DISPOSABLE) ×3 IMPLANT
TRAP SPECIMEN MUCOUS 40CC (MISCELLANEOUS) ×3 IMPLANT
WATER STERILE IRR 1000ML POUR (IV SOLUTION) ×3 IMPLANT

## 2014-12-14 NOTE — Anesthesia Procedure Notes (Signed)
Procedure Name: Intubation Date/Time: 12/14/2014 11:48 AM Performed by: Maryland Pink Pre-anesthesia Checklist: Patient identified, Emergency Drugs available, Suction available, Patient being monitored and Timeout performed Patient Re-evaluated:Patient Re-evaluated prior to inductionOxygen Delivery Method: Circle system utilized Preoxygenation: Pre-oxygenation with 100% oxygen Intubation Type: IV induction Ventilation: Mask ventilation without difficulty Laryngoscope Size: Mac and 3 Grade View: Grade I Tube type: Oral Tube size: 7.0 mm Number of attempts: 1 Airway Equipment and Method: Stylet and LTA kit utilized Placement Confirmation: ETT inserted through vocal cords under direct vision,  positive ETCO2 and breath sounds checked- equal and bilateral Secured at: 21 cm Tube secured with: Tape Dental Injury: Teeth and Oropharynx as per pre-operative assessment

## 2014-12-14 NOTE — Anesthesia Postprocedure Evaluation (Signed)
  Anesthesia Post-op Note  Patient: Patricia Stein  Procedure(s) Performed: Procedure(s): CERICAL FIVE-CERVICAL SIX, CERVICAL SIX-CERVICAL SEVEN ANTERIOR CERVICAL DECOMPRESSION/DISCECTOMY FUSION 2 LEVELS (N/A)  Patient Location: PACU  Anesthesia Type:General  Level of Consciousness: awake and alert   Airway and Oxygen Therapy: Patient Spontanous Breathing and Patient connected to nasal cannula oxygen  Post-op Pain: mild  Post-op Assessment: Post-op Vital signs reviewed and Patient's Cardiovascular Status Stable  Post-op Vital Signs: Reviewed and stable  Last Vitals:  Filed Vitals:   12/14/14 1409  BP: 122/90  Pulse: 80  Temp:   Resp: 17    Complications: No apparent anesthesia complications

## 2014-12-14 NOTE — Transfer of Care (Signed)
Immediate Anesthesia Transfer of Care Note  Patient: Judith BlonderCarolyn T Spain  Procedure(s) Performed: Procedure(s): CERICAL FIVE-CERVICAL SIX, CERVICAL SIX-CERVICAL SEVEN ANTERIOR CERVICAL DECOMPRESSION/DISCECTOMY FUSION 2 LEVELS (N/A)  Patient Location: PACU  Anesthesia Type:General  Level of Consciousness: awake, alert  and oriented  Airway & Oxygen Therapy: Patient Spontanous Breathing and Patient connected to nasal cannula oxygen face mask  Post-op Assessment: Report given to RN and Post -op Vital signs reviewed and stable  Post vital signs: Reviewed and stable  Last Vitals:  Filed Vitals:   12/14/14 1409  BP: 122/90  Pulse: 80  Temp:   Resp: 17    Complications: No apparent anesthesia complications

## 2014-12-14 NOTE — H&P (Signed)
Patricia Stein is an 69 y.o. female.   Chief Complaint: Neck and arm pain HPI: 69 year old female with neck pain with radiation into her upper extremities failing conservative management. Workup demonstrates evidence of significant disc degeneration with associated spondylosis and stenosis at C5-6 and C6-7. Patient is failed conservative management and presents now for two-level anterior cervical decompression infusion.  Past Medical History  Diagnosis Date  . Diabetes mellitus   . Hypothyroidism   . COPD (chronic obstructive pulmonary disease)   . Shortness of breath   . Anxiety   . Hypercholesteremia     pt reports that her levels are really high  . GERD (gastroesophageal reflux disease)   . Fibromyalgia   . Complication of anesthesia     pt reports with ~ 2010 they had trouble with her BP and getting it up she went to recovery room with welps on her unsure  what cause reaction  . Cataracts, both eyes   . Hypertension     stress test done in 2011, last seen heart MD in 2011  . Heart murmur     small per pt see [facility] cardiolgist last in 2011 (trace MR)  . Sleep apnea     uses CPAP with oxygen sleep study done ~ 2 years ago in [place] at [facility]  . Hypertriglyceridemia   . Asthma     Childhood    Past Surgical History  Procedure Laterality Date  . Abdominal hysterectomy    . Carpal tunnel release    . Tonsillectomy    . Tubal ligation    . Back surgery      x6  . Anterior lat lumbar fusion  10/14/2012    Procedure: ANTERIOR LATERAL LUMBAR FUSION 1 LEVEL;  Surgeon: Temple Pacini, MD;  Location: [facility];  Service: Neurosurgery;  Laterality: Left;    Family History  Problem Relation Age of Onset  . Anesthesia problems Neg Hx    Social History:  reports that she has been smoking Cigarettes.  She started smoking about 39 years ago. She has a 55.5 pack-year smoking history. She has never used smokeless tobacco. She reports that she does not drink alcohol or use  illicit drugs.  Allergies:  Allergies  Allergen Reactions  . Tape Rash    Mainly just the clear plastic tape.  Leaves redness and itching    Medications Prior to Admission  Medication Sig Dispense Refill  . albuterol (PROVENTIL HFA;VENTOLIN HFA) 108 (90 BASE) MCG/ACT inhaler Inhale 1 puff into the lungs every 6 (six) hours as needed for wheezing or shortness of breath.     Marland Kitchen amLODipine (NORVASC) 5 MG tablet Take 5 mg by mouth daily.    . clonazePAM (KLONOPIN) 0.5 MG tablet Take 0.5 mg by mouth 2 (two) times daily.    . DULoxetine (CYMBALTA) 60 MG capsule Take 60 mg by mouth 2 (two) times daily.     . furosemide (LASIX) 40 MG tablet Take 40 mg by mouth every morning.      Marland Kitchen levothyroxine (SYNTHROID, LEVOTHROID) 112 MCG tablet Take 112 mcg by mouth daily before breakfast.    . losartan (COZAAR) 100 MG tablet Take 100 mg by mouth daily.    . metFORMIN (GLUMETZA) 500 MG (MOD) 24 hr tablet Take 500 mg by mouth 3 (three) times daily.    . metoprolol succinate (TOPROL-XL) 100 MG 24 hr tablet Take 100 mg by mouth daily. Take with or immediately following a meal.    .  morphine (MS CONTIN) 15 MG 12 hr tablet Take 15 mg by mouth 3 (three) times daily. Takes with ms contin     . morphine (MS CONTIN) 30 MG 12 hr tablet Take 30 mg by mouth 3 (three) times daily. Takes with ms contin     . omeprazole (PRILOSEC) 20 MG capsule Take 20 mg by mouth daily as needed (acid reflux).     . potassium chloride (K-DUR) 10 MEQ tablet Take 10 mEq by mouth every morning.      . pravastatin (PRAVACHOL) 20 MG tablet Take 20 mg by mouth daily.    . pregabalin (LYRICA) 150 MG capsule Take 150 mg by mouth 2 (two) times daily.      . traZODone (DESYREL) 50 MG tablet Take 50 mg by mouth at bedtime.    . Alum & Mag Hydroxide-Simeth (MAGIC MOUTHWASH) SOLN Take 10 mLs by mouth 2 (two) times daily as needed (Sore mouth.). Swish and swallow. 100 mL 0  . diphenhydrAMINE (BENADRYL) 25 mg capsule Take 1 capsule (25 mg total)  by mouth every 6 (six) hours as needed for itching. 30 capsule 0  . hydrocortisone 1 % lotion Apply topically 4 (four) times daily as needed. Apply to affected area as needed for itching 118 mL 0  . polyethylene glycol (MIRALAX / GLYCOLAX) packet Take 17 g by mouth daily as needed. For constipation      No results found for this or any previous visit (from the past 48 hour(s)). US Carotid Duplex Bilateral  12/13/2014   CLINICAL DATA:  CAROTID BRUIT, laterality not indicated. Hypertension, hyperlipidemia, diabetes, previous tobacco abuse. History of carotid plaque.  EXAM: BILATERAL CAROTID DUPLEX ULTRASOUND  TECHNIQUE: Wallace Cullens scale imaging, color Doppler and duplex ultrasound was performed of bilateral carotid and vertebral arteries in the neck.  COMPARISON:  04/24/2010 by report only  REVIEW OF SYSTEMS: Quantification of carotid stenosis is based on velocity parameters that correlate the residual internal carotid diameter with NASCET-based stenosis levels, using the diameter of the distal internal carotid lumen as the denominator for stenosis measurement.  The following velocity measurements were obtained:  PEAK SYSTOLIC/END DIASTOLIC  RIGHT  ICA:                     180/36cm/sec  CCA:                     115/15cm/sec  SYSTOLIC ICA/CCA RATIO:  0.9  DIASTOLIC ICA/CCA RATIO: 2.5  ECA:                     87.0cm/sec  LEFT  ICA:                     125/38cm/sec  CCA:                     60/19cm/sec  SYSTOLIC ICA/CCA RATIO:  2.1  DIASTOLIC ICA/CCA RATIO: 2.0  ECA:                     217.0cm/sec  FINDINGS: RIGHT CAROTID ARTERY: Eccentric plaque in the carotid bulb extending into proximal internal and external carotid arteries resulting in at least mild stenosis stenosis at the ICA origin. Normal waveforms and color Doppler signal.  RIGHT VERTEBRAL ARTERY:  Normal flow direction and waveform.  LEFT CAROTID ARTERY: Calcified plaque in the carotid bifurcation extending to the ICA, with some distal acoustic shadowing.  No definite high-grade  stenosis. Normal waveforms and color Doppler signal through the ICA. Elevated peak systolic velocities in the proximal external carotid artery.  LEFT VERTEBRAL ARTERY: Normal flow direction and waveform.  IMPRESSION: 1. Bilateral carotid bifurcation and proximal ICA plaque, resulting in less than 50% diameter stenosis. The exam does not exclude plaque ulceration or embolization. Continued surveillance recommended. 2. 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   Electronically Signed   By: Corlis Leak  Hassell M.D.   On: 12/13/2014 09:55   Nm Myocar Multi W/spect W/wall Motion / Ef  12/13/2014   ADDENDUM REPORT: 12/13/2014 16:18  ADDENDUM: Second review of images shows moderate sized mild intensity inferior wall defect mildly more intense in post-injection images. The inferior wall has normal wall motion. There is noted radiotracer uptake in the adjacent gut that is more prominent in the postinjection images compared to the resting images that likely explains this finding as well as some subdiaphragmatic attenuation. Remains overall low risk study.   Electronically Signed   By: Dina RichJonathan  Branch   On: 12/13/2014 16:18   12/13/2014   CLINICAL DATA:  69 year old female with no known history of coronary artery disease referred for chest pain.  EXAM: MYOCARDIAL IMAGING WITH SPECT (REST AND PHARMACOLOGIC-STRESS)  GATED LEFT VENTRICULAR WALL MOTION STUDY  LEFT VENTRICULAR EJECTION FRACTION  TECHNIQUE: Standard myocardial SPECT imaging was performed after resting intravenous injection of 10 mCi Tc-4329m sestamibi. Subsequently, intravenous infusion of Lexiscan was performed under the supervision of the Cardiology staff. At peak effect of the drug, 30 mCi Tc-7429m sestamibi was injected intravenously and standard myocardial SPECT imaging was performed. Quantitative gated imaging was also performed to evaluate left  ventricular wall motion, and estimate left ventricular ejection fraction.  COMPARISON:  None.  FINDINGS: Pharmacological stress  Baseline EKG showed sinus rhythm with PVCs in a pattern of atrial bigeminy. After injection heart rate increased from 60 beats per min up to 85 beats per min, and blood pressure decreased from 127/80 down to 115/66. Post-injection EKG showed no specific ischemic changes and no significant arrhythmias.  Perfusion: There is a moderate-sized mild intensity inferior wall defect seen in the resting images. A similar but less intense defect is seen in the post-injection images. The inferior wall has normal wall motion. Findings are most consistent with sub- diaphragmatic attenuation.  Wall Motion: Normal left ventricular wall motion. No left ventricular dilation.  Left Ventricular Ejection Fraction: 60 %  End diastolic volume 54 ml  End systolic volume 22 ml  IMPRESSION: 1. No reversible ischemia or infarction.  2. Normal left ventricular wall motion.  3. Left ventricular ejection fraction 60%  4. Low-risk stress test findings*.  *2012 Appropriate Use Criteria for Coronary Revascularization Focused Update: J Am Coll Cardiol. 2012;59(9):857-881. http://content.dementiazones.comonlinejacc.org/article.aspx?articleid=1201161  Electronically Signed: By: Dina RichJonathan  Branch On: 12/13/2014 15:20    Review of Systems  Constitutional: Negative.   HENT: Negative.   Eyes: Negative.   Respiratory: Negative.   Cardiovascular: Negative.   Gastrointestinal: Negative.   Genitourinary: Negative.   Musculoskeletal: Negative.   Skin: Negative.   Neurological: Negative.   Endo/Heme/Allergies: Negative.   Psychiatric/Behavioral: Negative.     Blood pressure 140/58, pulse 59, temperature 98.2 F (36.8 C), temperature source Oral, SpO2 94 %. Physical Exam  Constitutional: She is oriented to person, place, and time. She appears well-developed and well-nourished. No distress.  HENT:  Head: Normocephalic and  atraumatic.  Right Ear: External ear normal.  Left Ear: External ear normal.  Nose: Nose normal.  Mouth/Throat: Oropharynx is clear and moist. No oropharyngeal exudate.  Eyes: Conjunctivae and EOM are normal. Pupils are equal, round, and reactive to light. Right eye exhibits no discharge. Left eye exhibits no discharge.  Neck: Normal range of motion. Neck supple. No tracheal deviation present. No thyromegaly present.  Cardiovascular: Normal rate, regular rhythm, normal heart sounds and intact distal pulses.  Exam reveals no friction rub.   No murmur heard. Respiratory: Effort normal and breath sounds normal. No respiratory distress. She has no wheezes.  GI: Soft. Bowel sounds are normal. She exhibits no distension. There is no tenderness.  Musculoskeletal: Normal range of motion. She exhibits no edema or tenderness.  Neurological: She is alert and oriented to person, place, and time. She has normal reflexes. She displays normal reflexes. No cranial nerve deficit. She exhibits normal muscle tone. Coordination normal.  Skin: Skin is warm and dry. No rash noted. She is not diaphoretic. No erythema.  Psychiatric: She has a normal mood and affect. Her behavior is normal. Judgment and thought content normal.     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 Plan C5-6, C6-7 ACDF with interbody peek cage, locally harvested autograft, and anterior plate instrumentation. Risks and benefits of been explained. Patient wishes to proceed.  Koua Deeg A 12/14/2014, 11:11 AM

## 2014-12-14 NOTE — Anesthesia Preprocedure Evaluation (Addendum)
Anesthesia Evaluation  Patient identified by MRN, date of birth, ID band Patient awake    Reviewed: Allergy & Precautions, NPO status , Patient's Chart, lab work & pertinent test results  Airway        Dental   Pulmonary COPDCurrent Smoker (55 pack year),          Cardiovascular hypertension, Pt. on medications  ECHO 12/13/2014 EF 65%, STRESS 12/13/2014 low risk   Neuro/Psych Anxiety Carotids <50% stenosis bilat    GI/Hepatic GERD-  Medicated,  Endo/Other  diabetes, Type 2  Renal/GU      Musculoskeletal   Abdominal   Peds  Hematology   Anesthesia Other Findings   Reproductive/Obstetrics                            Anesthesia Physical Anesthesia Plan  ASA: III  Anesthesia Plan: General   Post-op Pain Management:    Induction: Intravenous  Airway Management Planned: Oral ETT  Additional Equipment:   Intra-op Plan:   Post-operative Plan: Extubation in OR  Informed Consent: I have reviewed the patients History and Physical, chart, labs and discussed the procedure including the risks, benefits and alternatives for the proposed anesthesia with the patient or authorized representative who has indicated his/her understanding and acceptance.     Plan Discussed with:   Anesthesia Plan Comments: (Multimodal pain RX)        Anesthesia Quick Evaluation

## 2014-12-14 NOTE — Progress Notes (Signed)
Utilization review completed.  

## 2014-12-14 NOTE — Op Note (Signed)
Date of procedure: 12/14/2014  Date of dictation: Same  Service: Neurosurgery  Preoperative diagnosis: C5-6, C6-7 spondylosis with stenosis and radiculopathy  Postoperative diagnosis: Same  Procedure Name: C5-6, C6-7 anterior cervical discectomy with interbody fusion utilizing interbody peek cage, local harvested autograft, and anterior plate instrumentation  Surgeon:Brinn Westby A.Dameion Briles, M.D.  Asst. Surgeon: None  Anesthesia: General  Indication: 69 year old female with severe neck and bilateral upper extremity symptoms left greater than right consistent with a mixed cervical radiculopathy. Workup demonstrates evidence of significant disc degeneration with broad-based disc bulging associated spondylosis causing moderate spinal stenosis and neural foraminal stenosis at C5-6 and C6-7 left greater than right. Patient has failed conservative management and presents now for 2 level anterior cervical decompression and fusion  Operative note: After induction of anesthesia, patient positioned supine with her neck slightly Extended and held in place of halter traction. Anterior cervical region prepped and draped sterilely. Incision made overlying C6 on the right. Is carried down sharply to the platysma. Platysma was then divided vertically and dissection proceeded on the medial border of the sternocleidomastoid muscle and carotid sheath. Trachea and esophagus are mobilized and retracted towards the left. Prevertebral fascia history of intraspinal cone. Longus: Muscles elevated bilaterally. Deep self-retaining retractor was placed. Discs were incised at both levels. Discectomies performed with various instruments down to level of the posterior annulus. Microscope used throughout the remainder of the discectomy. Remaining aspects of annulus and osteophytes were removed down to the level of the posterior longitudinal ligament. Posterior lateral limb is elevated resect the still fashion using Kerrison rongeurs. Wide  central decompression then performed undercutting the bodies of C5 and C6. Decompression then proceeded into each neural foramen. Wide anterior foraminotomies performed on the course of the C6 nerve roots bilaterally. At this point a very thorough decompression had been achieved. There was no evidence of injury to the thecal sac and no bruits. Procedures and repeated at the C6-7 level without complication. Interbody fusion performed using interbody [employer]onic anatomic peek cages packed with locally harvested autograft. Each cage was impacted in place and recessed slightly from the anterior cortical margin. Atlantis anterior cervical plate placed over the C5, C6, C7 levels. These were attached with 13 mm screws which were variable angle. All screws given a final tightening found to be solid and the bone. Locking screws engaged all 3 levels. Final images revealed good position of the hardware and cages at the popliteal level with normal alignment of the spine. Wound was irrigated one final time. Hemostasis was achieved with bipolar cautery although the patient remained somewhat oozy. I left a medium Hemovac in the prevertebral space. Was enclosed in a typical fashion. Steri-Strips and sterile dressing applied. No apparent complications. Patient tolerated the procedure well and she returns to the recovery room postoperatively

## 2014-12-14 NOTE — Progress Notes (Signed)
Pt arrived in room from PACU.  Drowsy but can be roused. Surgical dressing clean, dry and intact. Soft Cervical collar in place.

## 2014-12-14 NOTE — Brief Op Note (Signed)
12/14/2014  1:38 PM  PATIENT:  Patricia Stein  69 y.o. female  PRE-OPERATIVE DIAGNOSIS:  spondylosis  POST-OPERATIVE DIAGNOSIS:  spondylosis  PROCEDURE:  Procedure(s): CERICAL FIVE-CERVICAL SIX, CERVICAL SIX-CERVICAL SEVEN ANTERIOR CERVICAL DECOMPRESSION/DISCECTOMY FUSION 2 LEVELS (N/A)  SURGEON:  Surgeon(s) and Role:    * Julio SicksHenry Gerlean Cid, MD - Primary  PHYSICIAN ASSISTANT:   ASSISTANTS: none   ANESTHESIA:   general  EBL:  Total I/O In: -  Out: 150 [Blood:150]  BLOOD ADMINISTERED:none  DRAINS: (Medium) Hemovact drain(s) in the Prevertebral space with  Suction Open   LOCAL MEDICATIONS USED:  NONE  SPECIMEN:  No Specimen  DISPOSITION OF SPECIMEN:  N/A  COUNTS:  YES  TOURNIQUET:  * No tourniquets in log *  DICTATION: .Dragon Dictation  PLAN OF CARE: Admit to inpatient   PATIENT DISPOSITION:  PACU - hemodynamically stable.   Delay start of Pharmacological VTE agent (>24hrs) due to surgical blood loss or risk of bleeding: yes

## 2014-12-14 NOTE — Progress Notes (Signed)
Doing well post op.  Left UE feels better. Neuro intact.  Wd c/d/i.  Drain output low

## 2014-12-14 NOTE — Progress Notes (Signed)
Orthopedic Tech Progress Note Patient Details:  Patricia Stein Jan 09, 1946 [id number] Patients already has c-collar on. Patient ID: Patricia Stein, female   DOB: Jan 09, 1946, 69 y.o.   MRN: [id number]   Patricia Stein, Patricia Stein 12/14/2014, 3:49 PM

## 2014-12-15 MED ORDER — CYCLOBENZAPRINE HCL 10 MG PO TABS: 10.0000 mg | ORAL_TABLET | Freq: Three times a day (TID) | ORAL | Status: DC | PRN

## 2014-12-15 MED ORDER — OXYCODONE-ACETAMINOPHEN 5-325 MG PO TABS: 1.0000 | ORAL_TABLET | ORAL | Status: DC | PRN

## 2014-12-15 NOTE — Discharge Summary (Signed)
Physician Discharge Summary  Patient ID: Patricia Stein MRN: [id number] DOB/AGE: 1945-11-24 69 y.o.  Admit date: 12/14/2014 Discharge date: 12/15/2014  Admission Diagnoses: C5-6 and C6-7 disc degeneration, spondylosis, cervicalgia, cervical radiculopathy  Discharge Diagnoses: The same Active Problems:   Cervical spondylosis with radiculopathy   Discharged Condition: good  Hospital Course: Dr. Jordan Likes performed at C5-6 and C6-7 anterior cervical discectomy, fusion, and plating on the patient on 12/14/2014.  The patient's postoperative course was unremarkable. On postoperative day #1 she requested discharge to home. She was given oral and written discharge instructions. All her questions were answered.  Consults: None Significant Diagnostic Studies: None Treatments: C5-6 and C6-7 anterior cervical discectomy, fusion, and plating. Discharge Exam: Blood pressure 93/38, pulse 60, temperature 97.8 F (36.6 C), temperature source Oral, resp. rate 18, height  (1.549 m), weight 85.775 kg (189 lb 1.6 oz), SpO2 95 %. The patient is alert and pleasant. She looks well. She is in no apparent distress. Her dressing is clean and dry. I removed the drain. There is no hematoma or shift. Her strength is normal.  Disposition: Home  Discharge Instructions    Call MD for:  difficulty breathing, headache or visual disturbances    Complete by:  As directed      Call MD for:  extreme fatigue    Complete by:  As directed      Call MD for:  hives    Complete by:  As directed      Call MD for:  persistant dizziness or light-headedness    Complete by:  As directed      Call MD for:  persistant nausea and vomiting    Complete by:  As directed      Call MD for:  redness, tenderness, or signs of infection (pain, swelling, redness, odor or green/yellow discharge around incision site)    Complete by:  As directed      Call MD for:  severe uncontrolled pain    Complete by:  As directed      Call MD  for:  temperature >100.4    Complete by:  As directed      Diet - low sodium heart healthy    Complete by:  As directed      Discharge instructions    Complete by:  As directed   Call [phone] for a followup appointment. Take a stool softener while you are using pain medications.     Driving Restrictions    Complete by:  As directed   Do not drive for 2 weeks.     Increase activity slowly    Complete by:  As directed      Lifting restrictions    Complete by:  As directed   Do not lift more than 5 pounds. No excessive bending or twisting.     May shower / Bathe    Complete by:  As directed   He may shower after the pain she is removed 3 days after surgery. Leave the incision alone.     Remove dressing in 48 hours    Complete by:  As directed   Your stitches are under the scan and will dissolve by themselves. The Steri-Strips will fall off after you take a few showers. Do not rub back or pick at the wound, Leave the wound alone.            Medication List    TAKE these medications        albuterol  108 (90 BASE) MCG/ACT inhaler  Commonly known as:  PROVENTIL HFA;VENTOLIN HFA  Inhale 1 puff into the lungs every 6 (six) hours as needed for wheezing or shortness of breath.     amLODipine 5 MG tablet  Commonly known as:  NORVASC  Take 5 mg by mouth daily.     clonazePAM 0.5 MG tablet  Commonly known as:  KLONOPIN  Take 0.5 mg by mouth 2 (two) times daily.     cyclobenzaprine 10 MG tablet  Commonly known as:  FLEXERIL  Take 1 tablet (10 mg total) by mouth 3 (three) times daily as needed for muscle spasms.     DULoxetine 60 MG capsule  Commonly known as:  CYMBALTA  Take 60 mg by mouth 2 (two) times daily.     furosemide 40 MG tablet  Commonly known as:  LASIX  Take 40 mg by mouth every morning.     levothyroxine 112 MCG tablet  Commonly known as:  SYNTHROID, LEVOTHROID  Take 112 mcg by mouth daily before breakfast.     losartan 100 MG tablet  Commonly known as:   COZAAR  Take 100 mg by mouth daily.     metFORMIN 500 MG (MOD) 24 hr tablet  Commonly known as:  GLUMETZA  Take 500 mg by mouth 3 (three) times daily.     metoprolol succinate 100 MG 24 hr tablet  Commonly known as:  TOPROL-XL  Take 100 mg by mouth daily. Take with or immediately following a meal.     morphine 15 MG 12 hr tablet  Commonly known as:  MS CONTIN  Take 15 mg by mouth 3 (three) times daily. Takes with ms contin 30mg      morphine 30 MG 12 hr tablet  Commonly known as:  MS CONTIN  Take 30 mg by mouth 3 (three) times daily. Takes with ms contin 15mg      omeprazole 20 MG capsule  Commonly known as:  PRILOSEC  Take 20 mg by mouth daily as needed (acid reflux).     oxyCODONE-acetaminophen 5-325 MG per tablet  Commonly known as:  PERCOCET/ROXICET  Take 1-2 tablets by mouth every 4 (four) hours as needed for moderate pain.     polyethylene glycol packet  Commonly known as:  MIRALAX / GLYCOLAX  Take 17 g by mouth daily as needed. For constipation     potassium chloride 10 MEQ tablet  Commonly known as:  K-DUR  Take 10 mEq by mouth every morning.     pravastatin 20 MG tablet  Commonly known as:  PRAVACHOL  Take 20 mg by mouth daily.     pregabalin 150 MG capsule  Commonly known as:  LYRICA  Take 150 mg by mouth 2 (two) times daily.     traZODone 50 MG tablet  Commonly known as:  DESYREL  Take 50 mg by mouth at bedtime.         SignedCristi Loron: Argenis Kumari D 12/15/2014, 8:37 AM

## 2014-12-15 NOTE — Progress Notes (Signed)
Discharge information reviewed with patient. All questions answered at this time. Awaiting for transport.     Sim BoastHavy, RN

## 2014-12-17 LAB — GLUCOSE, CAPILLARY: Glucose-Capillary: 89 mg/dL (ref 70–99)

## 2014-12-18 ENCOUNTER — Encounter (HOSPITAL_COMMUNITY): Payer: Self-pay | Admitting: Neurosurgery

## 2015-02-18 ENCOUNTER — Ambulatory Visit: Payer: Medicare Other | Admitting: Cardiovascular Disease

## 2015-02-20 ENCOUNTER — Ambulatory Visit: Payer: Medicare Other | Admitting: Cardiovascular Disease

## 2015-03-04 ENCOUNTER — Ambulatory Visit (INDEPENDENT_AMBULATORY_CARE_PROVIDER_SITE_OTHER): Payer: Medicare Other | Admitting: Cardiovascular Disease

## 2015-03-04 ENCOUNTER — Encounter: Payer: Self-pay | Admitting: Cardiovascular Disease

## 2015-03-04 VITALS — BP 160/98 | HR 92 | Ht 62.0 in | Wt 177.0 lb

## 2015-03-04 DIAGNOSIS — Z716 Tobacco abuse counseling: Secondary | ICD-10-CM | POA: Diagnosis not present

## 2015-03-04 DIAGNOSIS — I739 Peripheral vascular disease, unspecified: Secondary | ICD-10-CM

## 2015-03-04 DIAGNOSIS — I779 Disorder of arteries and arterioles, unspecified: Secondary | ICD-10-CM

## 2015-03-04 DIAGNOSIS — I10 Essential (primary) hypertension: Secondary | ICD-10-CM | POA: Diagnosis not present

## 2015-03-04 DIAGNOSIS — I5189 Other ill-defined heart diseases: Secondary | ICD-10-CM

## 2015-03-04 DIAGNOSIS — I519 Heart disease, unspecified: Secondary | ICD-10-CM | POA: Diagnosis not present

## 2015-03-04 MED ORDER — VARENICLINE TARTRATE 0.5 MG X 11 & 1 MG X 42 PO MISC: ORAL | Status: DC

## 2015-03-04 NOTE — Progress Notes (Signed)
Patient ID: Patricia Stein, female   DOB: 02-19-1946, 69 y.o.   MRN: [id number]      SUBJECTIVE: The patient is a 69 year old woman with a history of COPD, hypertension, tobacco abuse, obesity, diabetes, and hypothyroidism. She saw Ermalinda Barrios PA-C in March for preoperative clearance prior to undergoing cervical neck surgery. She underwent a low risk nuclear stress test on 12/13/14 with subdiaphragmatic attenuation artifact noted. Echocardiogram showed normal left ventricular systolic function and regional wall motion, EF 85-92%, grade 2 diastolic dysfunction, and mild right atrial and right ventricular dilatation with mild left atrial dilatation. Carotid Dopplers showed less than 50% bilateral carotid artery stenosis.  She ultimately underwent C5-6 and C6-7 anterior cervical discectomy with fusion and plating on 3/18.  She has tried quitting cigarettes using patches, medications, and gum but has been unable to do so. She currently denies chest pain. She has headaches starting in the back of her head and radiates to the top. She smokes anywhere from 1-1/2-2 packs of cigarettes daily.    Review of Systems: As per "subjective", otherwise negative.  Allergies  Allergen Reactions  . Tape Rash    Mainly just the clear plastic tape.  Leaves redness and itching    Current Outpatient Prescriptions  Medication Sig Dispense Refill  . albuterol (PROVENTIL HFA;VENTOLIN HFA) 108 (90 BASE) MCG/ACT inhaler Inhale 1 puff into the lungs every 6 (six) hours as needed for wheezing or shortness of breath.     Marland Kitchen amLODipine (NORVASC) 5 MG tablet Take 5 mg by mouth daily.    . clonazePAM (KLONOPIN) 0.5 MG tablet Take 0.5 mg by mouth 2 (two) times daily.    . cyclobenzaprine (FLEXERIL) 10 MG tablet Take 1 tablet (10 mg total) by mouth 3 (three) times daily as needed for muscle spasms. 50 tablet 1  . DULoxetine (CYMBALTA) 60 MG capsule Take 60 mg by mouth 2 (two) times daily.     . furosemide (LASIX) 40 MG  tablet Take 40 mg by mouth every morning.      Marland Kitchen levothyroxine (SYNTHROID, LEVOTHROID) 112 MCG tablet Take 112 mcg by mouth daily before breakfast.    . losartan (COZAAR) 100 MG tablet Take 100 mg by mouth daily.    . metFORMIN (GLUMETZA) 500 MG (MOD) 24 hr tablet Take 500 mg by mouth 3 (three) times daily.    . metoprolol succinate (TOPROL-XL) 100 MG 24 hr tablet Take 100 mg by mouth daily. Take with or immediately following a meal.    . morphine (MS CONTIN) 15 MG 12 hr tablet Take 15 mg by mouth 3 (three) times daily. Takes with ms contin 20m    . morphine (MS CONTIN) 30 MG 12 hr tablet Take 30 mg by mouth 3 (three) times daily. Takes with ms contin 190m   . omeprazole (PRILOSEC) 20 MG capsule Take 20 mg by mouth 2 (two) times daily before a meal.     . polyethylene glycol (MIRALAX / GLYCOLAX) packet Take 17 g by mouth daily as needed. For constipation    . potassium chloride (K-DUR) 10 MEQ tablet Take 10 mEq by mouth every morning.      . pravastatin (PRAVACHOL) 20 MG tablet Take 20 mg by mouth daily.    . pregabalin (LYRICA) 150 MG capsule Take 150 mg by mouth 2 (two) times daily.      . traZODone (DESYREL) 50 MG tablet Take 50 mg by mouth at bedtime.     No current facility-administered medications for this  visit.    Past Medical History  Diagnosis Date  . Diabetes mellitus   . Hypothyroidism   . COPD (chronic obstructive pulmonary disease)   . Shortness of breath   . Anxiety   . Hypercholesteremia     pt reports that her levels are really high  . GERD (gastroesophageal reflux disease)   . Fibromyalgia   . Complication of anesthesia     pt reports with ~ 2010 they had trouble with her BP and getting it up she went to recovery room with welps on her unsure  what cause reaction  . Cataracts, both eyes   . Hypertension     stress test done in 2011, last seen heart MD in 2011  . Heart murmur     small per pt see [facility] cardiolgist last in 2011 (trace MR)  . Sleep apnea      uses CPAP with oxygen sleep study done ~ 2 years ago in [place] at [facility]  . Hypertriglyceridemia   . Asthma     Childhood    Past Surgical History  Procedure Laterality Date  . Abdominal hysterectomy    . Carpal tunnel release    . Tonsillectomy    . Tubal ligation    . Back surgery      x6  . Anterior lat lumbar fusion  10/14/2012    Procedure: ANTERIOR LATERAL LUMBAR FUSION 1 LEVEL;  Surgeon: Charlie Pitter, MD;  Location: [facility];  Service: Neurosurgery;  Laterality: Left;  . Anterior cervical decomp/discectomy fusion N/A 12/14/2014    Procedure: CERICAL FIVE-CERVICAL SIX, CERVICAL SIX-CERVICAL SEVEN ANTERIOR CERVICAL DECOMPRESSION/DISCECTOMY FUSION 2 LEVELS;  Surgeon: Earnie Larsson, MD;  Location: [facility];  Service: Neurosurgery;  Laterality: N/A;    History   Social History  . Marital Status: Married    Spouse Name: N/A  . Number of Children: N/A  . Years of Education: N/A   Occupational History  . Not on file.   Social History Main Topics  . Smoking status: Current Every Day Smoker -- 1.50 packs/day for 37 years    Types: Cigarettes    Start date: 11/22/1975  . Smokeless tobacco: Never Used  . Alcohol Use: No  . Drug Use: No  . Sexual Activity: Not Currently   Other Topics Concern  . Not on file   Social History Narrative     Filed Vitals:   03/04/15 1455  BP: 160/98  Pulse: 92  Height: _0  (1.575 m)  Weight: 177 lb (80.287 kg)  SpO2: 92%    PHYSICAL EXAM General: NAD HEENT: Normal. Neck: No JVD, no thyromegaly. Lungs: Faint, end-expiratory rhonchi and wheezes, no rales. CV: Nondisplaced PMI.  Regular rate and rhythm, normal S1/S2, no Z9/D3, soft systolic murmur over RUSB. No pretibial or periankle edema.  No carotid bruit.  Normal pedal pulses.  Abdomen: Soft, nontender, obese, no distention.  Neurologic: Alert and oriented x 3.  Psych: Normal affect. Skin: Normal. Musculoskeletal: No gross deformities. Extremities: No clubbing  or cyanosis.   ECG: Most recent ECG reviewed.      ASSESSMENT AND PLAN: 1. Grade II diastolic dysfunction: Optimal BP control of prime importance. She says it is normally controlled but is nervous today.   2. Essential HTN: Markedly elevated but says she is nervous. I have asked the patient to check blood pressure readings 4-5 times per week, at different times throughout the day, in order to get a better approximation of mean BP values.  These results will be provided to me at the end of that period so that I can determine if antihypertensive medication titration is indicated.  3. Tobacco abuse: Cessation counseling provided. Will prescribe Chantix starter kit.  4. Carotid artery disease: Less than 50% bilaterally. Repeat in 1-2 years.   Dispo: f/u prn.  Kate Sable, M.D., F.A.C.C.

## 2015-03-04 NOTE — Patient Instructions (Signed)
   Chantix starter kit - sent to pharmacy today.  Continue all other medications.   Follow up as needed.

## 2015-10-09 DIAGNOSIS — I10 Essential (primary) hypertension: Secondary | ICD-10-CM | POA: Diagnosis not present

## 2015-10-09 DIAGNOSIS — Z6835 Body mass index (BMI) 35.0-35.9, adult: Secondary | ICD-10-CM | POA: Diagnosis not present

## 2015-10-09 DIAGNOSIS — M4722 Other spondylosis with radiculopathy, cervical region: Secondary | ICD-10-CM | POA: Diagnosis not present

## 2015-12-04 DIAGNOSIS — E119 Type 2 diabetes mellitus without complications: Secondary | ICD-10-CM | POA: Diagnosis not present

## 2015-12-04 DIAGNOSIS — F5089 Other specified eating disorder: Secondary | ICD-10-CM | POA: Diagnosis not present

## 2015-12-04 DIAGNOSIS — Z720 Tobacco use: Secondary | ICD-10-CM | POA: Diagnosis not present

## 2015-12-04 DIAGNOSIS — F17210 Nicotine dependence, cigarettes, uncomplicated: Secondary | ICD-10-CM | POA: Diagnosis not present

## 2015-12-04 DIAGNOSIS — Z299 Encounter for prophylactic measures, unspecified: Secondary | ICD-10-CM | POA: Diagnosis not present

## 2016-02-05 DIAGNOSIS — M4722 Other spondylosis with radiculopathy, cervical region: Secondary | ICD-10-CM | POA: Diagnosis not present

## 2016-03-20 DIAGNOSIS — I10 Essential (primary) hypertension: Secondary | ICD-10-CM | POA: Diagnosis not present

## 2016-03-20 DIAGNOSIS — J449 Chronic obstructive pulmonary disease, unspecified: Secondary | ICD-10-CM | POA: Diagnosis not present

## 2016-03-20 DIAGNOSIS — E119 Type 2 diabetes mellitus without complications: Secondary | ICD-10-CM | POA: Diagnosis not present

## 2016-04-21 DIAGNOSIS — E119 Type 2 diabetes mellitus without complications: Secondary | ICD-10-CM | POA: Diagnosis not present

## 2016-04-21 DIAGNOSIS — F419 Anxiety disorder, unspecified: Secondary | ICD-10-CM | POA: Diagnosis not present

## 2016-05-05 DIAGNOSIS — E119 Type 2 diabetes mellitus without complications: Secondary | ICD-10-CM | POA: Diagnosis not present

## 2016-05-05 DIAGNOSIS — I10 Essential (primary) hypertension: Secondary | ICD-10-CM | POA: Diagnosis not present

## 2016-05-05 DIAGNOSIS — J449 Chronic obstructive pulmonary disease, unspecified: Secondary | ICD-10-CM | POA: Diagnosis not present

## 2016-05-14 DIAGNOSIS — Z6833 Body mass index (BMI) 33.0-33.9, adult: Secondary | ICD-10-CM | POA: Diagnosis not present

## 2016-05-14 DIAGNOSIS — M4722 Other spondylosis with radiculopathy, cervical region: Secondary | ICD-10-CM | POA: Diagnosis not present

## 2016-05-14 DIAGNOSIS — I10 Essential (primary) hypertension: Secondary | ICD-10-CM | POA: Diagnosis not present

## 2016-07-01 DIAGNOSIS — F17200 Nicotine dependence, unspecified, uncomplicated: Secondary | ICD-10-CM | POA: Diagnosis not present

## 2016-07-01 DIAGNOSIS — M654 Radial styloid tenosynovitis [de Quervain]: Secondary | ICD-10-CM | POA: Diagnosis not present

## 2016-07-01 DIAGNOSIS — E7800 Pure hypercholesterolemia, unspecified: Secondary | ICD-10-CM | POA: Diagnosis not present

## 2016-07-01 DIAGNOSIS — E119 Type 2 diabetes mellitus without complications: Secondary | ICD-10-CM | POA: Diagnosis not present

## 2016-07-01 DIAGNOSIS — J449 Chronic obstructive pulmonary disease, unspecified: Secondary | ICD-10-CM | POA: Diagnosis not present

## 2016-07-02 DIAGNOSIS — J449 Chronic obstructive pulmonary disease, unspecified: Secondary | ICD-10-CM | POA: Diagnosis not present

## 2016-07-02 DIAGNOSIS — E119 Type 2 diabetes mellitus without complications: Secondary | ICD-10-CM | POA: Diagnosis not present

## 2016-07-02 DIAGNOSIS — I10 Essential (primary) hypertension: Secondary | ICD-10-CM | POA: Diagnosis not present

## 2016-07-10 DIAGNOSIS — B370 Candidal stomatitis: Secondary | ICD-10-CM | POA: Diagnosis not present

## 2016-07-10 DIAGNOSIS — J449 Chronic obstructive pulmonary disease, unspecified: Secondary | ICD-10-CM | POA: Diagnosis not present

## 2016-07-10 DIAGNOSIS — M778 Other enthesopathies, not elsewhere classified: Secondary | ICD-10-CM | POA: Diagnosis not present

## 2016-07-10 DIAGNOSIS — M25531 Pain in right wrist: Secondary | ICD-10-CM | POA: Diagnosis not present

## 2016-07-10 DIAGNOSIS — Z299 Encounter for prophylactic measures, unspecified: Secondary | ICD-10-CM | POA: Diagnosis not present

## 2016-07-10 DIAGNOSIS — E119 Type 2 diabetes mellitus without complications: Secondary | ICD-10-CM | POA: Diagnosis not present

## 2016-07-28 DIAGNOSIS — E119 Type 2 diabetes mellitus without complications: Secondary | ICD-10-CM | POA: Diagnosis not present

## 2016-07-28 DIAGNOSIS — E039 Hypothyroidism, unspecified: Secondary | ICD-10-CM | POA: Diagnosis not present

## 2016-07-28 DIAGNOSIS — Z0000 Encounter for general adult medical examination without abnormal findings: Secondary | ICD-10-CM | POA: Diagnosis not present

## 2016-07-28 DIAGNOSIS — Z7189 Other specified counseling: Secondary | ICD-10-CM | POA: Diagnosis not present

## 2016-07-28 DIAGNOSIS — Z79899 Other long term (current) drug therapy: Secondary | ICD-10-CM | POA: Diagnosis not present

## 2016-07-28 DIAGNOSIS — E7800 Pure hypercholesterolemia, unspecified: Secondary | ICD-10-CM | POA: Diagnosis not present

## 2016-07-28 DIAGNOSIS — Z6832 Body mass index (BMI) 32.0-32.9, adult: Secondary | ICD-10-CM | POA: Diagnosis not present

## 2016-07-28 DIAGNOSIS — J449 Chronic obstructive pulmonary disease, unspecified: Secondary | ICD-10-CM | POA: Diagnosis not present

## 2016-07-28 DIAGNOSIS — Z1211 Encounter for screening for malignant neoplasm of colon: Secondary | ICD-10-CM | POA: Diagnosis not present

## 2016-07-28 DIAGNOSIS — Z299 Encounter for prophylactic measures, unspecified: Secondary | ICD-10-CM | POA: Diagnosis not present

## 2016-07-28 DIAGNOSIS — Z1389 Encounter for screening for other disorder: Secondary | ICD-10-CM | POA: Diagnosis not present

## 2016-08-04 DIAGNOSIS — M654 Radial styloid tenosynovitis [de Quervain]: Secondary | ICD-10-CM | POA: Diagnosis not present

## 2016-08-04 DIAGNOSIS — G5601 Carpal tunnel syndrome, right upper limb: Secondary | ICD-10-CM | POA: Diagnosis not present

## 2016-08-04 DIAGNOSIS — M79641 Pain in right hand: Secondary | ICD-10-CM | POA: Diagnosis not present

## 2016-09-03 DIAGNOSIS — Z1231 Encounter for screening mammogram for malignant neoplasm of breast: Secondary | ICD-10-CM | POA: Diagnosis not present

## 2016-10-06 DIAGNOSIS — J449 Chronic obstructive pulmonary disease, unspecified: Secondary | ICD-10-CM | POA: Diagnosis not present

## 2016-10-06 DIAGNOSIS — E119 Type 2 diabetes mellitus without complications: Secondary | ICD-10-CM | POA: Diagnosis not present

## 2016-10-06 DIAGNOSIS — I10 Essential (primary) hypertension: Secondary | ICD-10-CM | POA: Diagnosis not present

## 2016-10-07 DIAGNOSIS — Z6833 Body mass index (BMI) 33.0-33.9, adult: Secondary | ICD-10-CM | POA: Diagnosis not present

## 2016-10-07 DIAGNOSIS — M47817 Spondylosis without myelopathy or radiculopathy, lumbosacral region: Secondary | ICD-10-CM | POA: Diagnosis not present

## 2016-10-07 DIAGNOSIS — I10 Essential (primary) hypertension: Secondary | ICD-10-CM | POA: Diagnosis not present

## 2016-10-08 ENCOUNTER — Other Ambulatory Visit: Payer: Self-pay | Admitting: Neurosurgery

## 2016-10-08 DIAGNOSIS — M47817 Spondylosis without myelopathy or radiculopathy, lumbosacral region: Secondary | ICD-10-CM

## 2016-10-09 DIAGNOSIS — H179 Unspecified corneal scar and opacity: Secondary | ICD-10-CM | POA: Diagnosis not present

## 2016-10-09 DIAGNOSIS — H2513 Age-related nuclear cataract, bilateral: Secondary | ICD-10-CM | POA: Diagnosis not present

## 2016-10-09 DIAGNOSIS — H5201 Hypermetropia, right eye: Secondary | ICD-10-CM | POA: Diagnosis not present

## 2016-10-17 ENCOUNTER — Ambulatory Visit
Admission: RE | Admit: 2016-10-17 | Discharge: 2016-10-17 | Disposition: A | Discharge status: Home / Self Care | Payer: Medicare Other | Source: Ambulatory Visit | Attending: Neurosurgery | Admitting: Neurosurgery

## 2016-10-17 DIAGNOSIS — M48061 Spinal stenosis, lumbar region without neurogenic claudication: Secondary | ICD-10-CM | POA: Diagnosis not present

## 2016-10-17 DIAGNOSIS — M47817 Spondylosis without myelopathy or radiculopathy, lumbosacral region: Secondary | ICD-10-CM

## 2016-10-17 MED ORDER — GADOBENATE DIMEGLUMINE 529 MG/ML IV SOLN
15.0000 mL | Freq: Once | INTRAVENOUS | Status: AC | PRN
  Administered 2016-10-17: 15 mL via INTRAVENOUS

## 2016-10-27 DIAGNOSIS — M4722 Other spondylosis with radiculopathy, cervical region: Secondary | ICD-10-CM | POA: Diagnosis not present

## 2016-10-29 DIAGNOSIS — J449 Chronic obstructive pulmonary disease, unspecified: Secondary | ICD-10-CM | POA: Diagnosis not present

## 2016-10-29 DIAGNOSIS — Z299 Encounter for prophylactic measures, unspecified: Secondary | ICD-10-CM | POA: Diagnosis not present

## 2016-10-29 DIAGNOSIS — I10 Essential (primary) hypertension: Secondary | ICD-10-CM | POA: Diagnosis not present

## 2016-10-29 DIAGNOSIS — E119 Type 2 diabetes mellitus without complications: Secondary | ICD-10-CM | POA: Diagnosis not present

## 2016-10-29 DIAGNOSIS — Z6832 Body mass index (BMI) 32.0-32.9, adult: Secondary | ICD-10-CM | POA: Diagnosis not present

## 2016-10-30 DIAGNOSIS — J449 Chronic obstructive pulmonary disease, unspecified: Secondary | ICD-10-CM | POA: Diagnosis not present

## 2016-10-30 DIAGNOSIS — I10 Essential (primary) hypertension: Secondary | ICD-10-CM | POA: Diagnosis not present

## 2016-10-30 DIAGNOSIS — E119 Type 2 diabetes mellitus without complications: Secondary | ICD-10-CM | POA: Diagnosis not present

## 2016-11-03 DIAGNOSIS — Z299 Encounter for prophylactic measures, unspecified: Secondary | ICD-10-CM | POA: Diagnosis not present

## 2016-11-03 DIAGNOSIS — E7800 Pure hypercholesterolemia, unspecified: Secondary | ICD-10-CM | POA: Diagnosis not present

## 2016-11-03 DIAGNOSIS — Z6832 Body mass index (BMI) 32.0-32.9, adult: Secondary | ICD-10-CM | POA: Diagnosis not present

## 2016-11-03 DIAGNOSIS — R51 Headache: Secondary | ICD-10-CM | POA: Diagnosis not present

## 2016-11-03 DIAGNOSIS — I10 Essential (primary) hypertension: Secondary | ICD-10-CM | POA: Diagnosis not present

## 2016-11-03 DIAGNOSIS — F17210 Nicotine dependence, cigarettes, uncomplicated: Secondary | ICD-10-CM | POA: Diagnosis not present

## 2016-11-03 DIAGNOSIS — J449 Chronic obstructive pulmonary disease, unspecified: Secondary | ICD-10-CM | POA: Diagnosis not present

## 2016-11-18 DIAGNOSIS — H2511 Age-related nuclear cataract, right eye: Secondary | ICD-10-CM | POA: Diagnosis not present

## 2016-11-18 DIAGNOSIS — H25811 Combined forms of age-related cataract, right eye: Secondary | ICD-10-CM | POA: Diagnosis not present

## 2016-12-09 DIAGNOSIS — R110 Nausea: Secondary | ICD-10-CM | POA: Diagnosis not present

## 2016-12-09 DIAGNOSIS — Z713 Dietary counseling and surveillance: Secondary | ICD-10-CM | POA: Diagnosis not present

## 2016-12-09 DIAGNOSIS — Z6832 Body mass index (BMI) 32.0-32.9, adult: Secondary | ICD-10-CM | POA: Diagnosis not present

## 2016-12-09 DIAGNOSIS — R109 Unspecified abdominal pain: Secondary | ICD-10-CM | POA: Diagnosis not present

## 2016-12-09 DIAGNOSIS — Z299 Encounter for prophylactic measures, unspecified: Secondary | ICD-10-CM | POA: Diagnosis not present

## 2016-12-09 DIAGNOSIS — F419 Anxiety disorder, unspecified: Secondary | ICD-10-CM | POA: Diagnosis not present

## 2016-12-14 DIAGNOSIS — R140 Abdominal distension (gaseous): Secondary | ICD-10-CM | POA: Diagnosis not present

## 2016-12-14 DIAGNOSIS — R1011 Right upper quadrant pain: Secondary | ICD-10-CM | POA: Diagnosis not present

## 2016-12-14 DIAGNOSIS — R932 Abnormal findings on diagnostic imaging of liver and biliary tract: Secondary | ICD-10-CM | POA: Diagnosis not present

## 2016-12-14 DIAGNOSIS — R110 Nausea: Secondary | ICD-10-CM | POA: Diagnosis not present

## 2016-12-14 DIAGNOSIS — K8020 Calculus of gallbladder without cholecystitis without obstruction: Secondary | ICD-10-CM | POA: Diagnosis not present

## 2016-12-14 DIAGNOSIS — R1012 Left upper quadrant pain: Secondary | ICD-10-CM | POA: Diagnosis not present

## 2016-12-14 DIAGNOSIS — R1013 Epigastric pain: Secondary | ICD-10-CM | POA: Diagnosis not present

## 2017-01-25 DIAGNOSIS — I10 Essential (primary) hypertension: Secondary | ICD-10-CM | POA: Diagnosis not present

## 2017-01-25 DIAGNOSIS — E119 Type 2 diabetes mellitus without complications: Secondary | ICD-10-CM | POA: Diagnosis not present

## 2017-01-25 DIAGNOSIS — J449 Chronic obstructive pulmonary disease, unspecified: Secondary | ICD-10-CM | POA: Diagnosis not present

## 2017-02-01 DIAGNOSIS — Z713 Dietary counseling and surveillance: Secondary | ICD-10-CM | POA: Diagnosis not present

## 2017-02-01 DIAGNOSIS — Z6832 Body mass index (BMI) 32.0-32.9, adult: Secondary | ICD-10-CM | POA: Diagnosis not present

## 2017-02-01 DIAGNOSIS — E7800 Pure hypercholesterolemia, unspecified: Secondary | ICD-10-CM | POA: Diagnosis not present

## 2017-02-01 DIAGNOSIS — K219 Gastro-esophageal reflux disease without esophagitis: Secondary | ICD-10-CM | POA: Diagnosis not present

## 2017-02-01 DIAGNOSIS — I10 Essential (primary) hypertension: Secondary | ICD-10-CM | POA: Diagnosis not present

## 2017-02-01 DIAGNOSIS — Z299 Encounter for prophylactic measures, unspecified: Secondary | ICD-10-CM | POA: Diagnosis not present

## 2017-02-01 DIAGNOSIS — F419 Anxiety disorder, unspecified: Secondary | ICD-10-CM | POA: Diagnosis not present

## 2017-02-01 DIAGNOSIS — F17200 Nicotine dependence, unspecified, uncomplicated: Secondary | ICD-10-CM | POA: Diagnosis not present

## 2017-02-01 DIAGNOSIS — J449 Chronic obstructive pulmonary disease, unspecified: Secondary | ICD-10-CM | POA: Diagnosis not present

## 2017-02-01 DIAGNOSIS — E039 Hypothyroidism, unspecified: Secondary | ICD-10-CM | POA: Diagnosis not present

## 2017-02-01 DIAGNOSIS — E119 Type 2 diabetes mellitus without complications: Secondary | ICD-10-CM | POA: Diagnosis not present

## 2017-02-03 DIAGNOSIS — H25812 Combined forms of age-related cataract, left eye: Secondary | ICD-10-CM | POA: Diagnosis not present

## 2017-02-03 DIAGNOSIS — H2512 Age-related nuclear cataract, left eye: Secondary | ICD-10-CM | POA: Diagnosis not present

## 2017-05-20 DIAGNOSIS — M8580 Other specified disorders of bone density and structure, unspecified site: Secondary | ICD-10-CM | POA: Diagnosis not present

## 2017-05-20 DIAGNOSIS — F419 Anxiety disorder, unspecified: Secondary | ICD-10-CM | POA: Diagnosis not present

## 2017-05-20 DIAGNOSIS — J449 Chronic obstructive pulmonary disease, unspecified: Secondary | ICD-10-CM | POA: Diagnosis not present

## 2017-05-20 DIAGNOSIS — Z299 Encounter for prophylactic measures, unspecified: Secondary | ICD-10-CM | POA: Diagnosis not present

## 2017-05-20 DIAGNOSIS — E039 Hypothyroidism, unspecified: Secondary | ICD-10-CM | POA: Diagnosis not present

## 2017-05-20 DIAGNOSIS — E1165 Type 2 diabetes mellitus with hyperglycemia: Secondary | ICD-10-CM | POA: Diagnosis not present

## 2017-05-20 DIAGNOSIS — K760 Fatty (change of) liver, not elsewhere classified: Secondary | ICD-10-CM | POA: Diagnosis not present

## 2017-05-20 DIAGNOSIS — Z6832 Body mass index (BMI) 32.0-32.9, adult: Secondary | ICD-10-CM | POA: Diagnosis not present

## 2017-05-20 DIAGNOSIS — I10 Essential (primary) hypertension: Secondary | ICD-10-CM | POA: Diagnosis not present

## 2017-05-20 DIAGNOSIS — K8020 Calculus of gallbladder without cholecystitis without obstruction: Secondary | ICD-10-CM | POA: Diagnosis not present

## 2017-05-20 DIAGNOSIS — E7800 Pure hypercholesterolemia, unspecified: Secondary | ICD-10-CM | POA: Diagnosis not present

## 2017-05-20 DIAGNOSIS — E119 Type 2 diabetes mellitus without complications: Secondary | ICD-10-CM | POA: Diagnosis not present

## 2017-05-21 DIAGNOSIS — I10 Essential (primary) hypertension: Secondary | ICD-10-CM | POA: Diagnosis not present

## 2017-05-21 DIAGNOSIS — J449 Chronic obstructive pulmonary disease, unspecified: Secondary | ICD-10-CM | POA: Diagnosis not present

## 2017-05-21 DIAGNOSIS — E119 Type 2 diabetes mellitus without complications: Secondary | ICD-10-CM | POA: Diagnosis not present

## 2017-08-02 DIAGNOSIS — E7800 Pure hypercholesterolemia, unspecified: Secondary | ICD-10-CM | POA: Diagnosis not present

## 2017-08-02 DIAGNOSIS — Z1331 Encounter for screening for depression: Secondary | ICD-10-CM | POA: Diagnosis not present

## 2017-08-02 DIAGNOSIS — Z1211 Encounter for screening for malignant neoplasm of colon: Secondary | ICD-10-CM | POA: Diagnosis not present

## 2017-08-02 DIAGNOSIS — Z7189 Other specified counseling: Secondary | ICD-10-CM | POA: Diagnosis not present

## 2017-08-02 DIAGNOSIS — Z6833 Body mass index (BMI) 33.0-33.9, adult: Secondary | ICD-10-CM | POA: Diagnosis not present

## 2017-08-02 DIAGNOSIS — E039 Hypothyroidism, unspecified: Secondary | ICD-10-CM | POA: Diagnosis not present

## 2017-08-02 DIAGNOSIS — J449 Chronic obstructive pulmonary disease, unspecified: Secondary | ICD-10-CM | POA: Diagnosis not present

## 2017-08-02 DIAGNOSIS — E1165 Type 2 diabetes mellitus with hyperglycemia: Secondary | ICD-10-CM | POA: Diagnosis not present

## 2017-08-02 DIAGNOSIS — Z299 Encounter for prophylactic measures, unspecified: Secondary | ICD-10-CM | POA: Diagnosis not present

## 2017-08-02 DIAGNOSIS — R5383 Other fatigue: Secondary | ICD-10-CM | POA: Diagnosis not present

## 2017-08-02 DIAGNOSIS — Z0000 Encounter for general adult medical examination without abnormal findings: Secondary | ICD-10-CM | POA: Diagnosis not present

## 2017-08-02 DIAGNOSIS — Z1339 Encounter for screening examination for other mental health and behavioral disorders: Secondary | ICD-10-CM | POA: Diagnosis not present

## 2017-08-02 DIAGNOSIS — Z79899 Other long term (current) drug therapy: Secondary | ICD-10-CM | POA: Diagnosis not present

## 2017-08-03 DIAGNOSIS — I10 Essential (primary) hypertension: Secondary | ICD-10-CM | POA: Diagnosis not present

## 2017-08-03 DIAGNOSIS — J449 Chronic obstructive pulmonary disease, unspecified: Secondary | ICD-10-CM | POA: Diagnosis not present

## 2017-08-03 DIAGNOSIS — E119 Type 2 diabetes mellitus without complications: Secondary | ICD-10-CM | POA: Diagnosis not present

## 2017-08-05 DIAGNOSIS — Z6833 Body mass index (BMI) 33.0-33.9, adult: Secondary | ICD-10-CM | POA: Diagnosis not present

## 2017-08-05 DIAGNOSIS — M4722 Other spondylosis with radiculopathy, cervical region: Secondary | ICD-10-CM | POA: Diagnosis not present

## 2017-08-05 DIAGNOSIS — I10 Essential (primary) hypertension: Secondary | ICD-10-CM | POA: Diagnosis not present

## 2017-08-09 DIAGNOSIS — Z299 Encounter for prophylactic measures, unspecified: Secondary | ICD-10-CM | POA: Diagnosis not present

## 2017-08-09 DIAGNOSIS — I10 Essential (primary) hypertension: Secondary | ICD-10-CM | POA: Diagnosis not present

## 2017-08-09 DIAGNOSIS — R042 Hemoptysis: Secondary | ICD-10-CM | POA: Diagnosis not present

## 2017-08-09 DIAGNOSIS — E1165 Type 2 diabetes mellitus with hyperglycemia: Secondary | ICD-10-CM | POA: Diagnosis not present

## 2017-08-09 DIAGNOSIS — Z6833 Body mass index (BMI) 33.0-33.9, adult: Secondary | ICD-10-CM | POA: Diagnosis not present

## 2017-08-09 DIAGNOSIS — F17210 Nicotine dependence, cigarettes, uncomplicated: Secondary | ICD-10-CM | POA: Diagnosis not present

## 2017-08-09 DIAGNOSIS — J449 Chronic obstructive pulmonary disease, unspecified: Secondary | ICD-10-CM | POA: Diagnosis not present

## 2017-08-09 DIAGNOSIS — I2510 Atherosclerotic heart disease of native coronary artery without angina pectoris: Secondary | ICD-10-CM | POA: Diagnosis not present

## 2017-08-09 DIAGNOSIS — R918 Other nonspecific abnormal finding of lung field: Secondary | ICD-10-CM | POA: Diagnosis not present

## 2017-08-09 DIAGNOSIS — I700 Atherosclerosis of aorta: Secondary | ICD-10-CM | POA: Diagnosis not present

## 2017-08-13 DIAGNOSIS — I10 Essential (primary) hypertension: Secondary | ICD-10-CM | POA: Diagnosis not present

## 2017-08-13 DIAGNOSIS — J449 Chronic obstructive pulmonary disease, unspecified: Secondary | ICD-10-CM | POA: Diagnosis not present

## 2017-08-13 DIAGNOSIS — J189 Pneumonia, unspecified organism: Secondary | ICD-10-CM | POA: Diagnosis not present

## 2017-08-13 DIAGNOSIS — E1165 Type 2 diabetes mellitus with hyperglycemia: Secondary | ICD-10-CM | POA: Diagnosis not present

## 2017-08-13 DIAGNOSIS — Z6833 Body mass index (BMI) 33.0-33.9, adult: Secondary | ICD-10-CM | POA: Diagnosis not present

## 2017-08-13 DIAGNOSIS — E039 Hypothyroidism, unspecified: Secondary | ICD-10-CM | POA: Diagnosis not present

## 2017-08-13 DIAGNOSIS — Z299 Encounter for prophylactic measures, unspecified: Secondary | ICD-10-CM | POA: Diagnosis not present

## 2017-08-13 DIAGNOSIS — E7800 Pure hypercholesterolemia, unspecified: Secondary | ICD-10-CM | POA: Diagnosis not present

## 2017-08-30 DIAGNOSIS — R0602 Shortness of breath: Secondary | ICD-10-CM | POA: Diagnosis not present

## 2017-08-30 DIAGNOSIS — I700 Atherosclerosis of aorta: Secondary | ICD-10-CM | POA: Diagnosis not present

## 2017-08-30 DIAGNOSIS — R05 Cough: Secondary | ICD-10-CM | POA: Diagnosis not present

## 2017-08-30 DIAGNOSIS — J189 Pneumonia, unspecified organism: Secondary | ICD-10-CM | POA: Diagnosis not present

## 2017-08-31 DIAGNOSIS — E119 Type 2 diabetes mellitus without complications: Secondary | ICD-10-CM | POA: Diagnosis not present

## 2017-08-31 DIAGNOSIS — J449 Chronic obstructive pulmonary disease, unspecified: Secondary | ICD-10-CM | POA: Diagnosis not present

## 2017-08-31 DIAGNOSIS — I10 Essential (primary) hypertension: Secondary | ICD-10-CM | POA: Diagnosis not present

## 2017-09-01 DIAGNOSIS — Z6833 Body mass index (BMI) 33.0-33.9, adult: Secondary | ICD-10-CM | POA: Diagnosis not present

## 2017-09-01 DIAGNOSIS — E1165 Type 2 diabetes mellitus with hyperglycemia: Secondary | ICD-10-CM | POA: Diagnosis not present

## 2017-09-01 DIAGNOSIS — F419 Anxiety disorder, unspecified: Secondary | ICD-10-CM | POA: Diagnosis not present

## 2017-09-01 DIAGNOSIS — I10 Essential (primary) hypertension: Secondary | ICD-10-CM | POA: Diagnosis not present

## 2017-09-01 DIAGNOSIS — Z299 Encounter for prophylactic measures, unspecified: Secondary | ICD-10-CM | POA: Diagnosis not present

## 2017-09-01 DIAGNOSIS — J449 Chronic obstructive pulmonary disease, unspecified: Secondary | ICD-10-CM | POA: Diagnosis not present

## 2017-09-27 DIAGNOSIS — R05 Cough: Secondary | ICD-10-CM | POA: Diagnosis not present

## 2017-09-27 DIAGNOSIS — Z299 Encounter for prophylactic measures, unspecified: Secondary | ICD-10-CM | POA: Diagnosis not present

## 2017-09-27 DIAGNOSIS — R0602 Shortness of breath: Secondary | ICD-10-CM | POA: Diagnosis not present

## 2017-09-27 DIAGNOSIS — R918 Other nonspecific abnormal finding of lung field: Secondary | ICD-10-CM | POA: Diagnosis not present

## 2017-09-27 DIAGNOSIS — J441 Chronic obstructive pulmonary disease with (acute) exacerbation: Secondary | ICD-10-CM | POA: Diagnosis not present

## 2017-09-27 DIAGNOSIS — R509 Fever, unspecified: Secondary | ICD-10-CM | POA: Diagnosis not present

## 2017-09-27 DIAGNOSIS — R0989 Other specified symptoms and signs involving the circulatory and respiratory systems: Secondary | ICD-10-CM | POA: Diagnosis not present

## 2017-09-27 DIAGNOSIS — J069 Acute upper respiratory infection, unspecified: Secondary | ICD-10-CM | POA: Diagnosis not present

## 2017-09-27 DIAGNOSIS — I700 Atherosclerosis of aorta: Secondary | ICD-10-CM | POA: Diagnosis not present

## 2017-09-27 DIAGNOSIS — I10 Essential (primary) hypertension: Secondary | ICD-10-CM | POA: Diagnosis not present

## 2017-09-27 DIAGNOSIS — Z6833 Body mass index (BMI) 33.0-33.9, adult: Secondary | ICD-10-CM | POA: Diagnosis not present

## 2017-09-27 DIAGNOSIS — J449 Chronic obstructive pulmonary disease, unspecified: Secondary | ICD-10-CM | POA: Diagnosis not present

## 2017-10-05 DIAGNOSIS — J181 Lobar pneumonia, unspecified organism: Secondary | ICD-10-CM | POA: Diagnosis not present

## 2017-10-05 DIAGNOSIS — J189 Pneumonia, unspecified organism: Secondary | ICD-10-CM | POA: Diagnosis not present

## 2017-10-05 DIAGNOSIS — J449 Chronic obstructive pulmonary disease, unspecified: Secondary | ICD-10-CM | POA: Diagnosis not present

## 2017-10-05 DIAGNOSIS — Z299 Encounter for prophylactic measures, unspecified: Secondary | ICD-10-CM | POA: Diagnosis not present

## 2017-10-05 DIAGNOSIS — B370 Candidal stomatitis: Secondary | ICD-10-CM | POA: Diagnosis not present

## 2017-10-05 DIAGNOSIS — Z6833 Body mass index (BMI) 33.0-33.9, adult: Secondary | ICD-10-CM | POA: Diagnosis not present

## 2017-10-05 DIAGNOSIS — I700 Atherosclerosis of aorta: Secondary | ICD-10-CM | POA: Diagnosis not present

## 2017-10-05 DIAGNOSIS — R0989 Other specified symptoms and signs involving the circulatory and respiratory systems: Secondary | ICD-10-CM | POA: Diagnosis not present

## 2017-11-05 ENCOUNTER — Ambulatory Visit (INDEPENDENT_AMBULATORY_CARE_PROVIDER_SITE_OTHER): Payer: Medicare Other | Admitting: Internal Medicine

## 2017-11-05 ENCOUNTER — Other Ambulatory Visit (INDEPENDENT_AMBULATORY_CARE_PROVIDER_SITE_OTHER): Payer: Medicare Other

## 2017-11-05 ENCOUNTER — Encounter: Payer: Self-pay | Admitting: Internal Medicine

## 2017-11-05 VITALS — BP 150/90 | HR 73 | Ht 61.0 in | Wt 179.8 lb

## 2017-11-05 DIAGNOSIS — J9611 Chronic respiratory failure with hypoxia: Secondary | ICD-10-CM

## 2017-11-05 DIAGNOSIS — J9612 Chronic respiratory failure with hypercapnia: Secondary | ICD-10-CM

## 2017-11-05 DIAGNOSIS — R0609 Other forms of dyspnea: Secondary | ICD-10-CM

## 2017-11-05 DIAGNOSIS — J449 Chronic obstructive pulmonary disease, unspecified: Secondary | ICD-10-CM | POA: Diagnosis not present

## 2017-11-05 DIAGNOSIS — F17210 Nicotine dependence, cigarettes, uncomplicated: Secondary | ICD-10-CM | POA: Diagnosis not present

## 2017-11-05 LAB — PULMONARY FUNCTION TEST
FEF 25-75 POST: 0.58 L/s
FEF 25-75 PRE: 0.69 L/s
FEF2575-%CHANGE-POST: -15 %
FEF2575-%PRED-PRE: 39 %
FEF2575-%Pred-Post: 33 %
FEV1-%Change-Post: -1 %
FEV1-%PRED-POST: 55 %
FEV1-%PRED-PRE: 56 %
FEV1-Post: 1.1 L
FEV1-Pre: 1.12 L
FEV1FVC-%CHANGE-POST: 8 %
FEV1FVC-%PRED-PRE: 89 %
FEV6-%CHANGE-POST: -9 %
FEV6-%Pred-Post: 59 %
FEV6-%Pred-Pre: 65 %
FEV6-Post: 1.49 L
FEV6-Pre: 1.64 L
FEV6FVC-%Change-Post: 0 %
FEV6FVC-%Pred-Post: 105 %
FEV6FVC-%Pred-Pre: 104 %
FVC-%Change-Post: -9 %
FVC-%PRED-PRE: 62 %
FVC-%Pred-Post: 56 %
FVC-POST: 1.49 L
FVC-PRE: 1.65 L
PRE FEV1/FVC RATIO: 68 %
Post FEV1/FVC ratio: 74 %
Post FEV6/FVC ratio: 100 %
Pre FEV6/FVC Ratio: 100 %

## 2017-11-05 LAB — BRAIN NATRIURETIC PEPTIDE: PRO B NATRI PEPTIDE: 62 pg/mL (ref 0.0–100.0)

## 2017-11-05 LAB — BASIC METABOLIC PANEL
BUN: 13 mg/dL (ref 6–23)
CALCIUM: 9.8 mg/dL (ref 8.4–10.5)
CO2: 31 mEq/L (ref 19–32)
Chloride: 99 mEq/L (ref 96–112)
Creatinine, Ser: 0.6 mg/dL (ref 0.40–1.20)
GFR: 105.06 mL/min (ref >60.00)
Glucose, Bld: 116 mg/dL — ABNORMAL HIGH (ref 70–99)
POTASSIUM: 4 meq/L (ref 3.5–5.1)
SODIUM: 135 meq/L (ref 135–145)

## 2017-11-05 LAB — TSH: TSH: 2.27 u[IU]/mL (ref 0.35–4.50)

## 2017-11-05 MED ORDER — TIOTROPIUM BROMIDE MONOHYDRATE 2.5 MCG/ACT IN AERS: 2.0000 | INHALATION_SPRAY | Freq: Every day | RESPIRATORY_TRACT | 11 refills | Status: DC

## 2017-11-05 MED ORDER — TIOTROPIUM BROMIDE MONOHYDRATE 2.5 MCG/ACT IN AERS: 2.0000 | INHALATION_SPRAY | Freq: Every day | RESPIRATORY_TRACT | 0 refills | Status: DC

## 2017-11-05 NOTE — Progress Notes (Signed)
PFT completed today.  

## 2017-11-05 NOTE — Patient Instructions (Addendum)
Plan A = Automatic = Symbicort 160 x 2 pffs, chase spriva 2 ffs and repeat  The symbicort 160  X 2   Work on inhaler technique:  relax and gently blow all the way out then take a nice smooth deep breath back in, triggering the inhaler at same time you start breathing in.  Hold for up to 5 seconds if you can. Blow out thru nose. Rinse and gargle with water when done      Plan B = Backup Only use your albuterol (proair) s a rescue medication to be used if you can't catch your breath by resting or doing a relaxed purse lip breathing pattern.  - The less you use it, the better it will work when you need it. - Ok to use the inhaler up to 2 puffs  every 4 hours if you must but call for appointment if use goes up over your usual need - Don't leave home without it !!  (think of it like the spare tire for your car)   Plan C = Crisis - only use your albuterol nebulizer if you first try Plan B and it fails to help > ok to use the nebulizer up to every 4 hours but if start needing it regularly call for immediate appointment    The key is to stop smoking completely before smoking completely stops you!   Please remember to go to the lab department downstairs in the basement  for your tests - we will call you with the results when they are available.      Please schedule a follow up office visit in 4 weeks, sooner if needed  with all medications /inhalers/ solutions in hand so we can verify exactly what you are taking. This includes all medications from all doctors and over the counters  - pfts on return

## 2017-11-05 NOTE — Progress Notes (Signed)
Subjective:     Patient ID: Patricia Stein, female   DOB: [birth year],   MRN: [id number]  HPI  37 yowf active smoker with asthma as child outgrew by 50 and very active and never limited until onset of  back problems around the 2003>>   wt gain then breathing worse around 2017 referred to pulmonary clinic 11/05/2017 by Dr   Sherryll Burger with  GOLD II criteria    11/05/2017 1st [facility] office visit/ Wert   Chief Complaint  Patient presents with  . pulmonary consult    referred by Dr. Sherryll Burger COPD-SOB-occ wheezing, cough minimal production mucus  indolent onset progressive x 10 years doe = MMRC3 = can't walk 100 yards even at a slow pace at a flat grade s stopping due to sob  Does [employer] but struggles leaning on cart and room to room  saba p exercise, never uses prior / ? better on symbicort  Cough is mostly daytime after stirring/ min mucoid Sleeps on cpap/02 ok  mb is 50 ft level and has to sit down when returns  No obvious day to day or daytime variability or assoc excess/ purulent sputum or mucus plugs or hemoptysis or cp or chest tightness, subjective wheeze or overt sinus or hb symptoms. No unusual exposure hx or h/o childhood pna/ asthma or knowledge of premature birth.  Sleeping ok on cpap /02  without nocturnal  or early am exacerbation  of respiratory  c/o's or need for noct saba. Also denies any obvious fluctuation of symptoms with weather or environmental changes or other aggravating or alleviating factors except as outlined above   Current Allergies, Complete Past Medical History, Past Surgical History, Family History, and Social History were reviewed in [employer] record.  ROS  The following are not active complaints unless bolded Hoarseness, sore throat, dysphagia, dental problems, itching, sneezing,  nasal congestion or discharge of excess mucus or purulent secretions, ear ache,   fever, chills, sweats, unintended wt loss or wt gain, classically pleuritic  or exertional cp,  orthopnea pnd or leg swelling, presyncope, palpitations, abdominal pain, anorexia, nausea, vomiting, diarrhea  or change in bowel habits or change in bladder habits, change in stools or change in urine, dysuria, hematuria,  rash, arthralgias, visual complaints, headache, numbness, weakness or ataxia or problems with walking or coordination,  change in mood/affect or memory.        Current Meds  Medication Sig  . albuterol (PROVENTIL HFA;VENTOLIN HFA) 108 (90 BASE) MCG/ACT inhaler Inhale 1 puff into the lungs every 6 (six) hours as needed for wheezing or shortness of breath.   Marland Kitchen amLODipine (NORVASC) 5 MG tablet Take 5 mg by mouth daily.  . budesonide-formoterol (SYMBICORT) 160-4.5 MCG/ACT inhaler Inhale 1 puff into the lungs 2 (two) times daily.  . clonazePAM (KLONOPIN) 0.5 MG tablet Take 0.5 mg by mouth 2 (two) times daily. Patient takes at night regularly, not always in the am.  . DULoxetine (CYMBALTA) 60 MG capsule Take 60 mg by mouth 2 (two) times daily.   . furosemide (LASIX) 40 MG tablet Take 40 mg by mouth every morning.    Marland Kitchen levothyroxine (SYNTHROID, LEVOTHROID) 112 MCG tablet Take 112 mcg by mouth daily before breakfast.  . losartan (COZAAR) 100 MG tablet Take 100 mg by mouth daily.  . metFORMIN (GLUMETZA) 500 MG (MOD) 24 hr tablet Take 500 mg by mouth 3 (three) times daily.  . metoprolol succinate (TOPROL-XL) 100 MG 24 hr tablet Take 100 mg  by mouth daily. Take with or immediately following a meal.  . omeprazole (PRILOSEC) 20 MG capsule Take 20 mg by mouth 2 (two) times daily before a meal.   . polyethylene glycol (MIRALAX / GLYCOLAX) packet Take 17 g by mouth daily as needed. For constipation  . potassium chloride (K-DUR) 10 MEQ tablet Take 10 mEq by mouth every morning.    . pregabalin (LYRICA) 150 MG capsule Take 150 mg by mouth 2 (two) times daily.    . traZODone (DESYREL) 50 MG tablet Take 50 mg by mouth at bedtime.     Review of Systems     Objective:    Physical Exam amb hoarse wf nad at rest   Wt Readings from Last 3 Encounters:  11/05/17 179 lb 12.8 oz (81.6 kg)  03/04/15 177 lb (80.3 kg)  12/14/14 189 lb 1.6 oz (85.8 kg)     Vital signs reviewed - Note on arrival 02 sats  90% on RA     HEENT: nl dentition, turbinates bilaterally, and oropharynx. Nl external ear canals without cough reflex   NECK :  without JVD/Nodes/TM/ nl carotid upstrokes bilaterally   LUNGS: no acc muscle use,  slt barrel contour chest wall with bilateral  Decreased bs without cough on insp or exp maneuver and hyper resonant to  percussion     CV:  RRR  no s3 or murmur or increase in P2, and no edema   ABD:  Quite obese but soft and nontender with limited  inspiratory excursion in the supine position. No bruits or organomegaly appreciated, bowel sounds nl  MS:  Nl gait/ ext warm without deformities, calf tenderness, cyanosis or clubbing No obvious joint restrictions   SKIN: warm and dry without lesions    NEURO:  alert, approp, nl sensorium with  no motor or cerebellar deficits apparent.    Labs ordered/ reviewed:      Chemistry      Component Value Date/Time   NA 135 11/05/2017 1550   K 4.0 11/05/2017 1550   CL 99 11/05/2017 1550   CO2 31 11/05/2017 1550   BUN 13 11/05/2017 1550   CREATININE 0.60 11/05/2017 1550      Component Value Date/Time   CALCIUM 9.8 11/05/2017 1550   ALKPHOS 85 10/31/2012 0555   AST 12 10/31/2012 0555   ALT 8 10/31/2012 0555   BILITOT 0.4 10/31/2012 0555        Lab Results  Component Value Date   WBC 5.4 11/05/2017   HGB 14.9 11/05/2017   HCT 42.1 11/05/2017   MCV 90.9 11/05/2017   PLT 221 11/05/2017       EOS                       0.1                                                                   11/05/2017     Lab Results  Component Value Date   TSH 2.27 11/05/2017     Lab Results  Component Value Date   PROBNP 62.0 11/05/2017         cxr report 09/27/17 resolving RML infiltrate > pt says  did f/u and "completely gone" but xrays/  reports unavailable at [facility]    Assessment:

## 2017-11-06 ENCOUNTER — Encounter: Payer: Self-pay | Admitting: Internal Medicine

## 2017-11-06 DIAGNOSIS — F17210 Nicotine dependence, cigarettes, uncomplicated: Secondary | ICD-10-CM | POA: Insufficient documentation

## 2017-11-06 DIAGNOSIS — J9611 Chronic respiratory failure with hypoxia: Secondary | ICD-10-CM | POA: Insufficient documentation

## 2017-11-06 DIAGNOSIS — J9612 Chronic respiratory failure with hypercapnia: Secondary | ICD-10-CM

## 2017-11-06 NOTE — Assessment & Plan Note (Signed)
HCO3  11/05/2017  = 31 11/05/2017 Patient Saturations on Room Air at Rest = 93% >> while Ambulating = 83% x 185 ft with Patient Saturations on 2 Liters of oxygen while Ambulating = 93%  rec as of 11/05/2017   rc 2lpm when walking outside of confines of  house and cautioned re 02 and cigs

## 2017-11-06 NOTE — Assessment & Plan Note (Signed)

## 2017-11-06 NOTE — Assessment & Plan Note (Addendum)
Spirometry 11/05/2017  FEV1 1.12 (56%)  Ratio 68 p symb  with mild curvature  - 11/05/2017  After extensive coaching inhaler device  effectiveness =  75% (short Ti) add spiriva     DDX of  difficult airways management almost all start with A and  include Adherence, Ace Inhibitors, Acid Reflux, Active Sinus Disease, Alpha 1 Antitripsin deficiency, Anxiety masquerading as Airways dz,  ABPA,  Allergy(esp in young), Aspiration (esp in elderly), Adverse effects of meds,  Active smokers, A bunch of PE's (a small clot burden can't cause this syndrome unless there is already severe underlying pulm or vascular dz with poor reserve) plus two Bs  = Bronchiectasis and Beta blocker use..and one C= CHF   Adherence is always the initial "prime suspect" and is a multilayered concern that requires a "trust but verify" approach in every patient - starting with knowing how to use medications, especially inhalers, correctly, keeping up with refills and understanding the fundamental difference between maintenance and prns vs those medications only taken for a very short course and then stopped and not refilled.  - see hfa teaching - return with all meds in hand using a trust but verify approach to confirm accurate Medication  Reconciliation The principal here is that until we are certain that the  patients are doing what we've asked, it makes no sense to ask them to do more.   Active smoking at top of the usual list of suspects (see separate a/p)   ? Acid (or non-acid) GERD > always difficult to exclude as up to 75% of pts in some series report no assoc GI/ Heartburn symptoms> rec continue max (24h)  acid suppression      ? Allergy / asthma component > check profile/ continue high dose ics   ? Anxiety/depression > usually at the bottom of this list of usual suspects but should be much higher on this pt's based on H and P and note already on psychotropics .    ? BB effects > note very high single doses of toprol may be  problematic here:  In the setting of respiratory symptoms of unknown etiology,  It would be preferable to use bystolic, the most beta -1  selective Beta blocker available in sample form, with bisoprolol the most selective generic choice  on the market, at least on a trial basis, to make sure the spillover Beta 2 effects of the less specific Beta blockers are not contributing to this patient's symptoms.  For now no change rx rec   ? CHF > excluded with such a low bnp today    Total time devoted to counseling  > 50 % of initial 60 min office visit:  review case with pt/ discussion of options/alternatives/ personally creating written customized instructions  in presence of pt  then going over those specific  Instructions directly with the pt including how to use all of the meds but in particular covering each new medication in detail and the difference between the maintenance= "automatic" meds and the prns using an action plan format for the latter (If this problem/symptom => do that organization reading Left to right).  Please see AVS from this visit for a full list of these instructions which I personally wrote for this pt and  are unique to this visit.

## 2017-11-08 LAB — RESPIRATORY ALLERGY PROFILE REGION II ~~LOC~~
Allergen, Cedar tree, t12: <0.1 kU/L
Allergen, D pternoyssinus,d7: 0.4 kU/L — ABNORMAL HIGH
Allergen, Mouse Urine Protein, e78: <0.1 kU/L
Allergen, Mulberry, t76: <0.1 kU/L
Allergen, Oak,t7: <0.1 kU/L
Aspergillus fumigatus, m3: <0.1 kU/L
Box Elder IgE: <0.1 kU/L
CLASS: 0
CLASS: 0
CLASS: 0
CLASS: 0
CLASS: 0
CLASS: 0
CLASS: 0
CLASS: 0
CLASS: 0
CLASS: 0
CLASS: 0
CLASS: 1
Cat Dander: 1.35 kU/L — ABNORMAL HIGH
Class: 0
Class: 0
Class: 0
Class: 0
Class: 0
Class: 0
Class: 0
Class: 0
Class: 0
Class: 0
Class: 1
Class: 2
Cockroach: <0.1 kU/L
D. FARINAE: 0.36 kU/L — AB
DOG DANDER: 0.26 kU/L — AB
Elm IgE: <0.1 kU/L
IGE (IMMUNOGLOBULIN E), SERUM: 35 kU/L (ref <=114)
Rough Pigweed  IgE: <0.1 kU/L
Sheep Sorrel IgE: <0.1 kU/L
Timothy Grass: <0.1 kU/L

## 2017-11-08 LAB — CBC WITH DIFFERENTIAL/PLATELET
BASOS PCT: 0.4 %
Basophils Absolute: 22 cells/uL (ref 0–200)
EOS PCT: 1.8 %
Eosinophils Absolute: 97 cells/uL (ref 15–500)
HCT: 42.1 % (ref 35.0–45.0)
Hemoglobin: 14.9 g/dL (ref 11.7–15.5)
Lymphs Abs: 2117 cells/uL (ref 850–3900)
MCH: 32.2 pg (ref 27.0–33.0)
MCHC: 35.4 g/dL (ref 32.0–36.0)
MCV: 90.9 fL (ref 80.0–100.0)
MPV: 11.4 fL (ref 7.5–12.5)
Monocytes Relative: 6.8 %
Neutro Abs: 2797 cells/uL (ref 1500–7800)
Neutrophils Relative %: 51.8 %
Platelets: 221 10*3/uL (ref 140–400)
RBC: 4.63 10*6/uL (ref 3.80–5.10)
RDW: 13.9 % (ref 11.0–15.0)
Total Lymphocyte: 39.2 %
WBC: 5.4 10*3/uL (ref 3.8–10.8)
WBCMIX: 367 {cells}/uL (ref 200–950)

## 2017-11-08 LAB — INTERPRETATION:

## 2017-11-09 NOTE — Progress Notes (Signed)
ATC, NA and no VM set up yet

## 2017-11-10 NOTE — Progress Notes (Signed)
Spoke with pt and notified of results per Dr. Wert. Pt verbalized understanding and denied any questions. 

## 2017-11-19 DIAGNOSIS — I10 Essential (primary) hypertension: Secondary | ICD-10-CM | POA: Diagnosis not present

## 2017-11-19 DIAGNOSIS — J449 Chronic obstructive pulmonary disease, unspecified: Secondary | ICD-10-CM | POA: Diagnosis not present

## 2017-11-19 DIAGNOSIS — E119 Type 2 diabetes mellitus without complications: Secondary | ICD-10-CM | POA: Diagnosis not present

## 2017-11-25 DIAGNOSIS — H04123 Dry eye syndrome of bilateral lacrimal glands: Secondary | ICD-10-CM | POA: Diagnosis not present

## 2017-12-06 DIAGNOSIS — I10 Essential (primary) hypertension: Secondary | ICD-10-CM | POA: Diagnosis not present

## 2017-12-06 DIAGNOSIS — E1165 Type 2 diabetes mellitus with hyperglycemia: Secondary | ICD-10-CM | POA: Diagnosis not present

## 2017-12-06 DIAGNOSIS — Z299 Encounter for prophylactic measures, unspecified: Secondary | ICD-10-CM | POA: Diagnosis not present

## 2017-12-06 DIAGNOSIS — J449 Chronic obstructive pulmonary disease, unspecified: Secondary | ICD-10-CM | POA: Diagnosis not present

## 2017-12-06 DIAGNOSIS — G4733 Obstructive sleep apnea (adult) (pediatric): Secondary | ICD-10-CM | POA: Diagnosis not present

## 2017-12-06 DIAGNOSIS — F419 Anxiety disorder, unspecified: Secondary | ICD-10-CM | POA: Diagnosis not present

## 2017-12-06 DIAGNOSIS — E7800 Pure hypercholesterolemia, unspecified: Secondary | ICD-10-CM | POA: Diagnosis not present

## 2017-12-06 DIAGNOSIS — Z6833 Body mass index (BMI) 33.0-33.9, adult: Secondary | ICD-10-CM | POA: Diagnosis not present

## 2017-12-06 DIAGNOSIS — F17210 Nicotine dependence, cigarettes, uncomplicated: Secondary | ICD-10-CM | POA: Diagnosis not present

## 2017-12-06 DIAGNOSIS — E039 Hypothyroidism, unspecified: Secondary | ICD-10-CM | POA: Diagnosis not present

## 2017-12-07 ENCOUNTER — Other Ambulatory Visit: Payer: Self-pay | Admitting: Internal Medicine

## 2017-12-07 DIAGNOSIS — E119 Type 2 diabetes mellitus without complications: Secondary | ICD-10-CM | POA: Diagnosis not present

## 2017-12-07 DIAGNOSIS — I10 Essential (primary) hypertension: Secondary | ICD-10-CM | POA: Diagnosis not present

## 2017-12-07 DIAGNOSIS — J449 Chronic obstructive pulmonary disease, unspecified: Secondary | ICD-10-CM | POA: Diagnosis not present

## 2017-12-08 ENCOUNTER — Ambulatory Visit (INDEPENDENT_AMBULATORY_CARE_PROVIDER_SITE_OTHER): Payer: Medicare Other | Admitting: Internal Medicine

## 2017-12-08 ENCOUNTER — Encounter: Payer: Self-pay | Admitting: Internal Medicine

## 2017-12-08 VITALS — BP 140/74 | HR 78 | Ht 61.0 in | Wt 184.0 lb

## 2017-12-08 DIAGNOSIS — J449 Chronic obstructive pulmonary disease, unspecified: Secondary | ICD-10-CM | POA: Diagnosis not present

## 2017-12-08 DIAGNOSIS — F17210 Nicotine dependence, cigarettes, uncomplicated: Secondary | ICD-10-CM | POA: Diagnosis not present

## 2017-12-08 LAB — PULMONARY FUNCTION TEST
DL/VA % PRED: 88 %
DL/VA: 3.9 ml/min/mmHg/L
DLCO COR % PRED: 61 %
DLCO COR: 12.43 ml/min/mmHg
DLCO UNC % PRED: 64 %
DLCO unc: 12.96 ml/min/mmHg
FEF 25-75 POST: 0.87 L/s
FEF 25-75 PRE: 0.8 L/s
FEF2575-%CHANGE-POST: 9 %
FEF2575-%PRED-POST: 50 %
FEF2575-%PRED-PRE: 46 %
FEV1-%Change-Post: 3 %
FEV1-%PRED-PRE: 54 %
FEV1-%Pred-Post: 56 %
FEV1-Post: 1.12 L
FEV1-Pre: 1.09 L
FEV1FVC-%Change-Post: -4 %
FEV1FVC-%PRED-PRE: 97 %
FEV6-%CHANGE-POST: 7 %
FEV6-%PRED-POST: 62 %
FEV6-%Pred-Pre: 58 %
FEV6-Post: 1.58 L
FEV6-Pre: 1.46 L
FEV6FVC-%Pred-Post: 105 %
FEV6FVC-%Pred-Pre: 105 %
FVC-%Change-Post: 7 %
FVC-%Pred-Post: 59 %
FVC-%Pred-Pre: 55 %
FVC-Post: 1.58 L
FVC-Pre: 1.46 L
POST FEV1/FVC RATIO: 71 %
Post FEV6/FVC ratio: 100 %
Pre FEV1/FVC ratio: 74 %
Pre FEV6/FVC Ratio: 100 %
RV % pred: 123 %
RV: 2.51 L
TLC % pred: 91 %
TLC: 4.23 L

## 2017-12-08 NOTE — Progress Notes (Signed)
Subjective:     Patient ID: Patricia Stein, female   DOB: [birth year],   MRN: [id number]    Brief patient profile:  25 yowf active smoker with asthma as child outgrew by 38 and very active and never limited until onset of  back problems around the 2003>>   wt gain then breathing worse around 2017 referred to pulmonary clinic 11/05/2017 by Dr   Sherryll Burger with  GOLD II criteria    History of Present Illness  11/05/2017 1st [facility] office visit/ Patricia Stein   Chief Complaint  Patient presents with  . pulmonary consult    referred by Dr. Sherryll Burger COPD-SOB-occ wheezing, cough minimal production mucus  indolent onset progressive x 10 years doe = MMRC3 = can't walk 100 yards even at a slow pace at a flat grade s stopping due to sob  Does [employer] but struggles leaning on cart and room to room  saba p exercise, never uses prior / ? better on symbicort  Cough is mostly daytime after stirring/ min mucoid Sleeps on cpap/02 2lpm   mb is 50 ft level and has to sit down when returns rec Plan A = Automatic = Symbicort 160 x 2 pffs, chase spriva 2 ffs and repeat  The symbicort 160  X 2  Work on inhaler technique: Plan B = Backup Only use your albuterol (proair) s a rescue medication Plan C = Crisis - only use your albuterol nebulizer if you first try Plan B  The key is to stop smoking completely before smoking completely stops you!  Please schedule a follow up office visit in 4 weeks, sooner if needed  with all medications /inhalers/ solutions in hand so we can verify exactly what you are taking. This includes all medications from all doctors and over the counters  - pfts on return      12/08/2017  f/u ov/Patricia Stein re:   COPD 0 maint on symbicort 160 2bid / no better on spiriva and too expensive  Chief Complaint  Patient presents with  . Follow-up    Copd doing about the same since last visit. No cough at this time.   Dyspnea: MMRC3 = can't walk 100 yards even at a slow pace at a flat grade s stopping due to  sob   Cough: better Sleep: ok on cpap  SABA use:  Rare   No obvious day to day or daytime variability or assoc excess/ purulent sputum or mucus plugs or hemoptysis or cp or chest tightness, subjective wheeze or overt sinus or hb symptoms. No unusual exposure hx or h/o childhood pna/ asthma or knowledge of premature birth.  Sleeping ok on cpap  without nocturnal  or early am exacerbation  of respiratory  c/o's or need for noct saba. Also denies any obvious fluctuation of symptoms with weather or environmental changes or other aggravating or alleviating factors except as outlined above   Current Allergies, Complete Past Medical History, Past Surgical History, Family History, and Social History were reviewed in [employer] record.  ROS  The following are not active complaints unless bolded Hoarseness, sore throat, dysphagia, dental problems, itching, sneezing,  nasal congestion or discharge of excess mucus or purulent secretions, ear ache,   fever, chills, sweats, unintended wt loss or wt gain, classically pleuritic or exertional cp,  orthopnea pnd or leg swelling, presyncope, palpitations, abdominal pain, anorexia, nausea, vomiting, diarrhea  or change in bowel habits or change in bladder habits, change in stools or change in  urine, dysuria, hematuria,  rash, arthralgias, visual complaints, headache, numbness, weakness or ataxia or problems with walking or coordination,  change in mood/affect or memory.        Current Meds  Medication Sig  . albuterol (PROVENTIL HFA;VENTOLIN HFA) 108 (90 BASE) MCG/ACT inhaler Inhale 1 puff into the lungs every 6 (six) hours as needed for wheezing or shortness of breath.   Marland Kitchen. amLODipine (NORVASC) 5 MG tablet Take 5 mg by mouth daily.  . budesonide-formoterol (SYMBICORT) 160-4.5 MCG/ACT inhaler Inhale 1 puff into the lungs 2 (two) times daily.  . clonazePAM (KLONOPIN) 0.5 MG tablet Take 0.5 mg by mouth 2 (two) times daily. Patient takes at night  regularly, not always in the am.  . DULoxetine (CYMBALTA) 60 MG capsule Take 60 mg by mouth 2 (two) times daily.   . furosemide (LASIX) 40 MG tablet Take 40 mg by mouth every morning.    Marland Kitchen. levothyroxine (SYNTHROID, LEVOTHROID) 112 MCG tablet Take 112 mcg by mouth daily before breakfast.  . losartan (COZAAR) 100 MG tablet Take 100 mg by mouth daily.  . metFORMIN (GLUMETZA) 500 MG (MOD) 24 hr tablet Take 500 mg by mouth 3 (three) times daily.  . metoprolol succinate (TOPROL-XL) 100 MG 24 hr tablet Take 100 mg by mouth daily. Take with or immediately following a meal.  . omeprazole (PRILOSEC) 20 MG capsule Take 20 mg by mouth 2 (two) times daily before a meal.   . OXYGEN Inhale into the lungs.  . polyethylene glycol (MIRALAX / GLYCOLAX) packet Take 17 g by mouth daily as needed. For constipation  . potassium chloride (K-DUR) 10 MEQ tablet Take 10 mEq by mouth every morning.    . pregabalin (LYRICA) 150 MG capsule Take 150 mg by mouth 2 (two) times daily.    . Tiotropium Bromide Monohydrate (SPIRIVA RESPIMAT) 2.5 MCG/ACT AERS Inhale 2 puffs into the lungs daily.  . traZODone (DESYREL) 50 MG tablet Take 50 mg by mouth at bedtime.          Objective:   Physical Exam   amb obese wf nad/ strongly  smells of cigs   12/08/2017        184   11/05/17 179 lb 12.8 oz (81.6 kg)  03/04/15 177 lb (80.3 kg)  12/14/14 189 lb 1.6 oz (85.8 kg)      Vital signs reviewed - Note on arrival 02 sats  90% on RA     HEENT: nl dentition, turbinates bilaterally, and oropharynx. Nl external ear canals without cough reflex   NECK :  without JVD/Nodes/TM/ nl carotid upstrokes bilaterally   LUNGS: no acc muscle use,   slt barrel contour chest wall with bilateral  Distant bs without cough on insp or exp maneuver and moderate  Hyperresonant  to  percussion bilaterally     CV:  RRR  no s3 or murmur or increase in P2, and no edema   ABD: quite obese nontender with limited inspiratory excursion in the supine  position. No bruits or organomegaly appreciated, bowel sounds nl  MS:  Nl gait/ ext warm without deformities, calf tenderness, cyanosis or clubbing No obvious joint restrictions   SKIN: warm and dry without lesions    NEURO:  alert, approp, nl sensorium with  no motor or cerebellar deficits apparent.             Assessment:

## 2017-12-08 NOTE — Progress Notes (Signed)
PFT completed today 12/08/17  

## 2017-12-08 NOTE — Patient Instructions (Addendum)
Plan A = Automatic =symbicort 160 Take 2 puffs first thing in am and then another 2 puffs about 12 hours later.    Work on inhaler technique:  relax and gently blow all the way out then take a nice smooth deep breath back in, triggering the inhaler at same time you start breathing in.  Hold for up to 5 seconds if you can. Blow out thru nose. Rinse and gargle with water when done        Plan B = Backup Only use your albuterol (PROAIR) s a rescue medication to be used if you can't catch your breath by resting or doing a relaxed purse lip breathing pattern.  - The less you use it, the better it will work when you need it. - Ok to use the inhaler up to 2 puffs  every 4 hours if you must but call for appointment if use goes up over your usual need - Don't leave home without it !!  (think of it like the spare tire for your car)   Plan C = Crisis - only use your albuterol nebulizer if you first try Plan B and it fails to help > ok to use the nebulizer up to every 4 hours but if start needing it regularly call for immediate appointment    Please schedule a follow up visit in 3 months but call sooner if needed  with all medications /inhalers/ solutions in hand so we can verify exactly what you are taking. This includes all medications from all doctors and over the counters

## 2017-12-09 ENCOUNTER — Encounter: Payer: Self-pay | Admitting: Internal Medicine

## 2017-12-09 NOTE — Assessment & Plan Note (Addendum)
Spirometry 11/05/2017  FEV1 1.12 (56%)  Ratio 68 p symb  with mild curvature  - 11/05/2017  After extensive coaching inhaler device  effectiveness =  75% (short Ti) add spiriva      - Allergy profile  11/05/17  >  Eos 1.8% /  IgE  35 pos cat > dog/dust  - PFT's  12/08/2017  FEV1 1.12 (56 % ) ratio 71  p 3 % improvement from saba p symbicort 160 prior to study with DLCO  64/61 % corrects to 88  % for alv volume   - The proper method of use, as well as anticipated side effects, of a metered-dose inhaler are discussed and demonstrated to the patient. Improved effectiveness after extensive coaching during this visit to a level of approximately 75 % from a baseline of 50 %   - very short Ti   Her fev1/ VC is actually  = 0.63 so she is probably best characterized with GOLD II copd with asthmatic component and despite active smoking is well controlled on symb 160 and no better on lama which she can't afford anyway.  No change in rx feasible for now, needs to work on better hfa and less inhaling cigs (see separate a/p)   I had an extended discussion with the patient reviewing all relevant studies completed to date and  lasting 15 to 20 minutes of a 25 minute visit    Each maintenance medication was reviewed in detail including most importantly the difference between maintenance and prns and under what circumstances the prns are to be triggered using an action plan format that is not reflected in the computer generated alphabetically organized AVS.    Please see AVS for specific instructions unique to this visit that I personally wrote and verbalized to the the pt in detail and then reviewed with pt  by my nurse highlighting any  changes in therapy recommended at today's visit to their plan of care.

## 2017-12-09 NOTE — Assessment & Plan Note (Signed)

## 2018-02-10 DIAGNOSIS — J449 Chronic obstructive pulmonary disease, unspecified: Secondary | ICD-10-CM | POA: Diagnosis not present

## 2018-02-10 DIAGNOSIS — F419 Anxiety disorder, unspecified: Secondary | ICD-10-CM | POA: Diagnosis not present

## 2018-02-10 DIAGNOSIS — E039 Hypothyroidism, unspecified: Secondary | ICD-10-CM | POA: Diagnosis not present

## 2018-02-10 DIAGNOSIS — Z299 Encounter for prophylactic measures, unspecified: Secondary | ICD-10-CM | POA: Diagnosis not present

## 2018-02-10 DIAGNOSIS — E7800 Pure hypercholesterolemia, unspecified: Secondary | ICD-10-CM | POA: Diagnosis not present

## 2018-02-10 DIAGNOSIS — G4733 Obstructive sleep apnea (adult) (pediatric): Secondary | ICD-10-CM | POA: Diagnosis not present

## 2018-02-10 DIAGNOSIS — E1165 Type 2 diabetes mellitus with hyperglycemia: Secondary | ICD-10-CM | POA: Diagnosis not present

## 2018-02-10 DIAGNOSIS — J181 Lobar pneumonia, unspecified organism: Secondary | ICD-10-CM | POA: Diagnosis not present

## 2018-02-10 DIAGNOSIS — I10 Essential (primary) hypertension: Secondary | ICD-10-CM | POA: Diagnosis not present

## 2018-02-10 DIAGNOSIS — Z6834 Body mass index (BMI) 34.0-34.9, adult: Secondary | ICD-10-CM | POA: Diagnosis not present

## 2018-03-11 ENCOUNTER — Ambulatory Visit: Payer: Medicare Other | Admitting: Internal Medicine

## 2018-03-16 ENCOUNTER — Ambulatory Visit: Payer: Medicare Other | Admitting: Internal Medicine

## 2018-03-23 DIAGNOSIS — I10 Essential (primary) hypertension: Secondary | ICD-10-CM | POA: Diagnosis not present

## 2018-03-23 DIAGNOSIS — Z299 Encounter for prophylactic measures, unspecified: Secondary | ICD-10-CM | POA: Diagnosis not present

## 2018-03-23 DIAGNOSIS — J449 Chronic obstructive pulmonary disease, unspecified: Secondary | ICD-10-CM | POA: Diagnosis not present

## 2018-03-23 DIAGNOSIS — G4733 Obstructive sleep apnea (adult) (pediatric): Secondary | ICD-10-CM | POA: Diagnosis not present

## 2018-03-23 DIAGNOSIS — E1165 Type 2 diabetes mellitus with hyperglycemia: Secondary | ICD-10-CM | POA: Diagnosis not present

## 2018-03-23 DIAGNOSIS — E7800 Pure hypercholesterolemia, unspecified: Secondary | ICD-10-CM | POA: Diagnosis not present

## 2018-03-23 DIAGNOSIS — Z6834 Body mass index (BMI) 34.0-34.9, adult: Secondary | ICD-10-CM | POA: Diagnosis not present

## 2018-03-24 DIAGNOSIS — M4722 Other spondylosis with radiculopathy, cervical region: Secondary | ICD-10-CM | POA: Diagnosis not present

## 2018-03-24 DIAGNOSIS — H04123 Dry eye syndrome of bilateral lacrimal glands: Secondary | ICD-10-CM | POA: Diagnosis not present

## 2018-03-24 DIAGNOSIS — H1789 Other corneal scars and opacities: Secondary | ICD-10-CM | POA: Diagnosis not present

## 2018-06-07 DIAGNOSIS — E119 Type 2 diabetes mellitus without complications: Secondary | ICD-10-CM | POA: Diagnosis not present

## 2018-06-07 DIAGNOSIS — I10 Essential (primary) hypertension: Secondary | ICD-10-CM | POA: Diagnosis not present

## 2018-06-07 DIAGNOSIS — J449 Chronic obstructive pulmonary disease, unspecified: Secondary | ICD-10-CM | POA: Diagnosis not present

## 2018-07-26 DIAGNOSIS — Z299 Encounter for prophylactic measures, unspecified: Secondary | ICD-10-CM | POA: Diagnosis not present

## 2018-07-26 DIAGNOSIS — J449 Chronic obstructive pulmonary disease, unspecified: Secondary | ICD-10-CM | POA: Diagnosis not present

## 2018-07-26 DIAGNOSIS — F419 Anxiety disorder, unspecified: Secondary | ICD-10-CM | POA: Diagnosis not present

## 2018-07-26 DIAGNOSIS — I10 Essential (primary) hypertension: Secondary | ICD-10-CM | POA: Diagnosis not present

## 2018-07-26 DIAGNOSIS — E1165 Type 2 diabetes mellitus with hyperglycemia: Secondary | ICD-10-CM | POA: Diagnosis not present

## 2018-07-26 DIAGNOSIS — Z6834 Body mass index (BMI) 34.0-34.9, adult: Secondary | ICD-10-CM | POA: Diagnosis not present

## 2018-07-26 DIAGNOSIS — F17210 Nicotine dependence, cigarettes, uncomplicated: Secondary | ICD-10-CM | POA: Diagnosis not present

## 2018-08-05 DIAGNOSIS — Z1339 Encounter for screening examination for other mental health and behavioral disorders: Secondary | ICD-10-CM | POA: Diagnosis not present

## 2018-08-05 DIAGNOSIS — Z299 Encounter for prophylactic measures, unspecified: Secondary | ICD-10-CM | POA: Diagnosis not present

## 2018-08-05 DIAGNOSIS — Z1211 Encounter for screening for malignant neoplasm of colon: Secondary | ICD-10-CM | POA: Diagnosis not present

## 2018-08-05 DIAGNOSIS — R5383 Other fatigue: Secondary | ICD-10-CM | POA: Diagnosis not present

## 2018-08-05 DIAGNOSIS — Z0000 Encounter for general adult medical examination without abnormal findings: Secondary | ICD-10-CM | POA: Diagnosis not present

## 2018-08-05 DIAGNOSIS — Z79899 Other long term (current) drug therapy: Secondary | ICD-10-CM | POA: Diagnosis not present

## 2018-08-05 DIAGNOSIS — E7800 Pure hypercholesterolemia, unspecified: Secondary | ICD-10-CM | POA: Diagnosis not present

## 2018-08-05 DIAGNOSIS — Z1331 Encounter for screening for depression: Secondary | ICD-10-CM | POA: Diagnosis not present

## 2018-08-05 DIAGNOSIS — E039 Hypothyroidism, unspecified: Secondary | ICD-10-CM | POA: Diagnosis not present

## 2018-08-05 DIAGNOSIS — Z6836 Body mass index (BMI) 36.0-36.9, adult: Secondary | ICD-10-CM | POA: Diagnosis not present

## 2018-08-05 DIAGNOSIS — Z7189 Other specified counseling: Secondary | ICD-10-CM | POA: Diagnosis not present

## 2018-08-05 DIAGNOSIS — I10 Essential (primary) hypertension: Secondary | ICD-10-CM | POA: Diagnosis not present

## 2018-08-19 DIAGNOSIS — I6529 Occlusion and stenosis of unspecified carotid artery: Secondary | ICD-10-CM | POA: Diagnosis not present

## 2018-09-30 DIAGNOSIS — I10 Essential (primary) hypertension: Secondary | ICD-10-CM | POA: Diagnosis not present

## 2018-09-30 DIAGNOSIS — E119 Type 2 diabetes mellitus without complications: Secondary | ICD-10-CM | POA: Diagnosis not present

## 2018-09-30 DIAGNOSIS — J449 Chronic obstructive pulmonary disease, unspecified: Secondary | ICD-10-CM | POA: Diagnosis not present

## 2018-11-09 DIAGNOSIS — I10 Essential (primary) hypertension: Secondary | ICD-10-CM | POA: Diagnosis not present

## 2018-11-09 DIAGNOSIS — F17210 Nicotine dependence, cigarettes, uncomplicated: Secondary | ICD-10-CM | POA: Diagnosis not present

## 2018-11-09 DIAGNOSIS — Z6835 Body mass index (BMI) 35.0-35.9, adult: Secondary | ICD-10-CM | POA: Diagnosis not present

## 2018-11-09 DIAGNOSIS — Z299 Encounter for prophylactic measures, unspecified: Secondary | ICD-10-CM | POA: Diagnosis not present

## 2018-11-09 DIAGNOSIS — R05 Cough: Secondary | ICD-10-CM | POA: Diagnosis not present

## 2018-11-09 DIAGNOSIS — J449 Chronic obstructive pulmonary disease, unspecified: Secondary | ICD-10-CM | POA: Diagnosis not present

## 2018-11-09 DIAGNOSIS — F419 Anxiety disorder, unspecified: Secondary | ICD-10-CM | POA: Diagnosis not present

## 2018-11-09 DIAGNOSIS — R0602 Shortness of breath: Secondary | ICD-10-CM | POA: Diagnosis not present

## 2018-11-09 DIAGNOSIS — E1165 Type 2 diabetes mellitus with hyperglycemia: Secondary | ICD-10-CM | POA: Diagnosis not present

## 2018-12-26 DIAGNOSIS — I10 Essential (primary) hypertension: Secondary | ICD-10-CM | POA: Diagnosis not present

## 2018-12-26 DIAGNOSIS — J449 Chronic obstructive pulmonary disease, unspecified: Secondary | ICD-10-CM | POA: Diagnosis not present

## 2018-12-26 DIAGNOSIS — E119 Type 2 diabetes mellitus without complications: Secondary | ICD-10-CM | POA: Diagnosis not present

## 2019-01-23 DIAGNOSIS — J449 Chronic obstructive pulmonary disease, unspecified: Secondary | ICD-10-CM | POA: Diagnosis not present

## 2019-01-23 DIAGNOSIS — I10 Essential (primary) hypertension: Secondary | ICD-10-CM | POA: Diagnosis not present

## 2019-01-23 DIAGNOSIS — E119 Type 2 diabetes mellitus without complications: Secondary | ICD-10-CM | POA: Diagnosis not present

## 2019-02-27 DIAGNOSIS — E1165 Type 2 diabetes mellitus with hyperglycemia: Secondary | ICD-10-CM | POA: Diagnosis not present

## 2019-02-27 DIAGNOSIS — Z6836 Body mass index (BMI) 36.0-36.9, adult: Secondary | ICD-10-CM | POA: Diagnosis not present

## 2019-02-27 DIAGNOSIS — Z299 Encounter for prophylactic measures, unspecified: Secondary | ICD-10-CM | POA: Diagnosis not present

## 2019-02-27 DIAGNOSIS — F17210 Nicotine dependence, cigarettes, uncomplicated: Secondary | ICD-10-CM | POA: Diagnosis not present

## 2019-02-27 DIAGNOSIS — I10 Essential (primary) hypertension: Secondary | ICD-10-CM | POA: Diagnosis not present

## 2019-03-22 DIAGNOSIS — I10 Essential (primary) hypertension: Secondary | ICD-10-CM | POA: Diagnosis not present

## 2019-03-22 DIAGNOSIS — J449 Chronic obstructive pulmonary disease, unspecified: Secondary | ICD-10-CM | POA: Diagnosis not present

## 2019-03-22 DIAGNOSIS — E119 Type 2 diabetes mellitus without complications: Secondary | ICD-10-CM | POA: Diagnosis not present

## 2019-04-25 DIAGNOSIS — J449 Chronic obstructive pulmonary disease, unspecified: Secondary | ICD-10-CM | POA: Diagnosis not present

## 2019-04-25 DIAGNOSIS — I10 Essential (primary) hypertension: Secondary | ICD-10-CM | POA: Diagnosis not present

## 2019-04-25 DIAGNOSIS — E119 Type 2 diabetes mellitus without complications: Secondary | ICD-10-CM | POA: Diagnosis not present

## 2019-05-22 DIAGNOSIS — E119 Type 2 diabetes mellitus without complications: Secondary | ICD-10-CM | POA: Diagnosis not present

## 2019-05-22 DIAGNOSIS — Z961 Presence of intraocular lens: Secondary | ICD-10-CM | POA: Diagnosis not present

## 2019-05-22 DIAGNOSIS — H524 Presbyopia: Secondary | ICD-10-CM | POA: Diagnosis not present

## 2019-06-13 DIAGNOSIS — G4733 Obstructive sleep apnea (adult) (pediatric): Secondary | ICD-10-CM | POA: Diagnosis not present

## 2019-06-13 DIAGNOSIS — Z299 Encounter for prophylactic measures, unspecified: Secondary | ICD-10-CM | POA: Diagnosis not present

## 2019-06-13 DIAGNOSIS — J449 Chronic obstructive pulmonary disease, unspecified: Secondary | ICD-10-CM | POA: Diagnosis not present

## 2019-06-13 DIAGNOSIS — I10 Essential (primary) hypertension: Secondary | ICD-10-CM | POA: Diagnosis not present

## 2019-06-13 DIAGNOSIS — Z6836 Body mass index (BMI) 36.0-36.9, adult: Secondary | ICD-10-CM | POA: Diagnosis not present

## 2019-06-13 DIAGNOSIS — E1165 Type 2 diabetes mellitus with hyperglycemia: Secondary | ICD-10-CM | POA: Diagnosis not present

## 2019-06-19 DIAGNOSIS — I10 Essential (primary) hypertension: Secondary | ICD-10-CM | POA: Diagnosis not present

## 2019-06-19 DIAGNOSIS — J449 Chronic obstructive pulmonary disease, unspecified: Secondary | ICD-10-CM | POA: Diagnosis not present

## 2019-06-19 DIAGNOSIS — E119 Type 2 diabetes mellitus without complications: Secondary | ICD-10-CM | POA: Diagnosis not present

## 2019-07-20 DIAGNOSIS — I10 Essential (primary) hypertension: Secondary | ICD-10-CM | POA: Diagnosis not present

## 2019-07-20 DIAGNOSIS — E119 Type 2 diabetes mellitus without complications: Secondary | ICD-10-CM | POA: Diagnosis not present

## 2019-07-20 DIAGNOSIS — J449 Chronic obstructive pulmonary disease, unspecified: Secondary | ICD-10-CM | POA: Diagnosis not present

## 2019-08-09 DIAGNOSIS — E7800 Pure hypercholesterolemia, unspecified: Secondary | ICD-10-CM | POA: Diagnosis not present

## 2019-08-09 DIAGNOSIS — Z1331 Encounter for screening for depression: Secondary | ICD-10-CM | POA: Diagnosis not present

## 2019-08-09 DIAGNOSIS — Z79899 Other long term (current) drug therapy: Secondary | ICD-10-CM | POA: Diagnosis not present

## 2019-08-09 DIAGNOSIS — Z0000 Encounter for general adult medical examination without abnormal findings: Secondary | ICD-10-CM | POA: Diagnosis not present

## 2019-08-09 DIAGNOSIS — Z6836 Body mass index (BMI) 36.0-36.9, adult: Secondary | ICD-10-CM | POA: Diagnosis not present

## 2019-08-09 DIAGNOSIS — Z1211 Encounter for screening for malignant neoplasm of colon: Secondary | ICD-10-CM | POA: Diagnosis not present

## 2019-08-09 DIAGNOSIS — Z1339 Encounter for screening examination for other mental health and behavioral disorders: Secondary | ICD-10-CM | POA: Diagnosis not present

## 2019-08-09 DIAGNOSIS — E039 Hypothyroidism, unspecified: Secondary | ICD-10-CM | POA: Diagnosis not present

## 2019-08-09 DIAGNOSIS — J449 Chronic obstructive pulmonary disease, unspecified: Secondary | ICD-10-CM | POA: Diagnosis not present

## 2019-08-09 DIAGNOSIS — Z7189 Other specified counseling: Secondary | ICD-10-CM | POA: Diagnosis not present

## 2019-08-09 DIAGNOSIS — F17210 Nicotine dependence, cigarettes, uncomplicated: Secondary | ICD-10-CM | POA: Diagnosis not present

## 2019-08-09 DIAGNOSIS — Z299 Encounter for prophylactic measures, unspecified: Secondary | ICD-10-CM | POA: Diagnosis not present

## 2019-08-10 DIAGNOSIS — R52 Pain, unspecified: Secondary | ICD-10-CM | POA: Diagnosis not present

## 2019-08-10 DIAGNOSIS — M25462 Effusion, left knee: Secondary | ICD-10-CM | POA: Diagnosis not present

## 2019-08-10 DIAGNOSIS — F17200 Nicotine dependence, unspecified, uncomplicated: Secondary | ICD-10-CM | POA: Diagnosis not present

## 2019-08-10 DIAGNOSIS — Z79899 Other long term (current) drug therapy: Secondary | ICD-10-CM | POA: Diagnosis not present

## 2019-08-10 DIAGNOSIS — F419 Anxiety disorder, unspecified: Secondary | ICD-10-CM | POA: Diagnosis not present

## 2019-08-10 DIAGNOSIS — S82002A Unspecified fracture of left patella, initial encounter for closed fracture: Secondary | ICD-10-CM | POA: Diagnosis not present

## 2019-08-10 DIAGNOSIS — E039 Hypothyroidism, unspecified: Secondary | ICD-10-CM | POA: Diagnosis not present

## 2019-08-10 DIAGNOSIS — I10 Essential (primary) hypertension: Secondary | ICD-10-CM | POA: Diagnosis not present

## 2019-08-10 DIAGNOSIS — S82142A Displaced bicondylar fracture of left tibia, initial encounter for closed fracture: Secondary | ICD-10-CM | POA: Diagnosis not present

## 2019-08-10 DIAGNOSIS — M25562 Pain in left knee: Secondary | ICD-10-CM | POA: Diagnosis not present

## 2019-08-10 DIAGNOSIS — K219 Gastro-esophageal reflux disease without esophagitis: Secondary | ICD-10-CM | POA: Diagnosis not present

## 2019-08-10 DIAGNOSIS — R609 Edema, unspecified: Secondary | ICD-10-CM | POA: Diagnosis not present

## 2019-08-10 DIAGNOSIS — S8992XA Unspecified injury of left lower leg, initial encounter: Secondary | ICD-10-CM | POA: Diagnosis not present

## 2019-08-10 DIAGNOSIS — J449 Chronic obstructive pulmonary disease, unspecified: Secondary | ICD-10-CM | POA: Diagnosis not present

## 2019-08-10 DIAGNOSIS — W19XXXA Unspecified fall, initial encounter: Secondary | ICD-10-CM | POA: Diagnosis not present

## 2019-08-10 DIAGNOSIS — W010XXA Fall on same level from slipping, tripping and stumbling without subsequent striking against object, initial encounter: Secondary | ICD-10-CM | POA: Diagnosis not present

## 2019-08-10 DIAGNOSIS — Z7984 Long term (current) use of oral hypoglycemic drugs: Secondary | ICD-10-CM | POA: Diagnosis not present

## 2019-08-10 DIAGNOSIS — E119 Type 2 diabetes mellitus without complications: Secondary | ICD-10-CM | POA: Diagnosis not present

## 2019-08-15 DIAGNOSIS — S82092A Other fracture of left patella, initial encounter for closed fracture: Secondary | ICD-10-CM | POA: Diagnosis not present

## 2019-08-15 DIAGNOSIS — W19XXXA Unspecified fall, initial encounter: Secondary | ICD-10-CM | POA: Diagnosis not present

## 2019-08-15 DIAGNOSIS — S83522A Sprain of posterior cruciate ligament of left knee, initial encounter: Secondary | ICD-10-CM | POA: Diagnosis not present

## 2019-08-15 DIAGNOSIS — S83509A Sprain of unspecified cruciate ligament of unspecified knee, initial encounter: Secondary | ICD-10-CM | POA: Diagnosis not present

## 2019-08-15 DIAGNOSIS — M25562 Pain in left knee: Secondary | ICD-10-CM | POA: Diagnosis not present

## 2019-08-15 DIAGNOSIS — Y92009 Unspecified place in unspecified non-institutional (private) residence as the place of occurrence of the external cause: Secondary | ICD-10-CM | POA: Diagnosis not present

## 2019-08-17 DIAGNOSIS — M25462 Effusion, left knee: Secondary | ICD-10-CM | POA: Diagnosis not present

## 2019-08-17 DIAGNOSIS — S83242A Other tear of medial meniscus, current injury, left knee, initial encounter: Secondary | ICD-10-CM | POA: Diagnosis not present

## 2019-08-17 DIAGNOSIS — S82202A Unspecified fracture of shaft of left tibia, initial encounter for closed fracture: Secondary | ICD-10-CM | POA: Diagnosis not present

## 2019-08-17 DIAGNOSIS — M25562 Pain in left knee: Secondary | ICD-10-CM | POA: Diagnosis not present

## 2019-08-17 DIAGNOSIS — S83422A Sprain of lateral collateral ligament of left knee, initial encounter: Secondary | ICD-10-CM | POA: Diagnosis not present

## 2019-08-17 DIAGNOSIS — M7122 Synovial cyst of popliteal space [Baker], left knee: Secondary | ICD-10-CM | POA: Diagnosis not present

## 2019-08-17 DIAGNOSIS — S82142A Displaced bicondylar fracture of left tibia, initial encounter for closed fracture: Secondary | ICD-10-CM | POA: Diagnosis not present

## 2019-08-17 DIAGNOSIS — S83412A Sprain of medial collateral ligament of left knee, initial encounter: Secondary | ICD-10-CM | POA: Diagnosis not present

## 2019-08-17 DIAGNOSIS — S92152A Displaced avulsion fracture (chip fracture) of left talus, initial encounter for closed fracture: Secondary | ICD-10-CM | POA: Diagnosis not present

## 2019-08-22 DIAGNOSIS — I10 Essential (primary) hypertension: Secondary | ICD-10-CM | POA: Diagnosis not present

## 2019-08-22 DIAGNOSIS — E1165 Type 2 diabetes mellitus with hyperglycemia: Secondary | ICD-10-CM | POA: Diagnosis not present

## 2019-08-22 DIAGNOSIS — Z299 Encounter for prophylactic measures, unspecified: Secondary | ICD-10-CM | POA: Diagnosis not present

## 2019-08-22 DIAGNOSIS — S83519A Sprain of anterior cruciate ligament of unspecified knee, initial encounter: Secondary | ICD-10-CM | POA: Diagnosis not present

## 2019-08-22 DIAGNOSIS — S83419A Sprain of medial collateral ligament of unspecified knee, initial encounter: Secondary | ICD-10-CM | POA: Diagnosis not present

## 2019-08-22 DIAGNOSIS — Z6836 Body mass index (BMI) 36.0-36.9, adult: Secondary | ICD-10-CM | POA: Diagnosis not present

## 2019-08-22 DIAGNOSIS — T148XXA Other injury of unspecified body region, initial encounter: Secondary | ICD-10-CM | POA: Diagnosis not present

## 2019-08-23 DIAGNOSIS — S83512A Sprain of anterior cruciate ligament of left knee, initial encounter: Secondary | ICD-10-CM | POA: Diagnosis not present

## 2019-09-06 DIAGNOSIS — S83512D Sprain of anterior cruciate ligament of left knee, subsequent encounter: Secondary | ICD-10-CM | POA: Diagnosis not present

## 2019-10-10 DIAGNOSIS — J449 Chronic obstructive pulmonary disease, unspecified: Secondary | ICD-10-CM | POA: Diagnosis not present

## 2019-10-10 DIAGNOSIS — Z299 Encounter for prophylactic measures, unspecified: Secondary | ICD-10-CM | POA: Diagnosis not present

## 2019-10-10 DIAGNOSIS — E119 Type 2 diabetes mellitus without complications: Secondary | ICD-10-CM | POA: Diagnosis not present

## 2019-10-10 DIAGNOSIS — I10 Essential (primary) hypertension: Secondary | ICD-10-CM | POA: Diagnosis not present

## 2019-10-10 DIAGNOSIS — E1165 Type 2 diabetes mellitus with hyperglycemia: Secondary | ICD-10-CM | POA: Diagnosis not present

## 2019-10-10 DIAGNOSIS — F17210 Nicotine dependence, cigarettes, uncomplicated: Secondary | ICD-10-CM | POA: Diagnosis not present

## 2019-10-10 DIAGNOSIS — Z6836 Body mass index (BMI) 36.0-36.9, adult: Secondary | ICD-10-CM | POA: Diagnosis not present

## 2019-10-13 DIAGNOSIS — S83512D Sprain of anterior cruciate ligament of left knee, subsequent encounter: Secondary | ICD-10-CM | POA: Diagnosis not present

## 2019-10-26 DIAGNOSIS — I10 Essential (primary) hypertension: Secondary | ICD-10-CM | POA: Diagnosis not present

## 2019-10-31 DIAGNOSIS — F17210 Nicotine dependence, cigarettes, uncomplicated: Secondary | ICD-10-CM | POA: Diagnosis not present

## 2019-10-31 DIAGNOSIS — Z299 Encounter for prophylactic measures, unspecified: Secondary | ICD-10-CM | POA: Diagnosis not present

## 2019-10-31 DIAGNOSIS — J449 Chronic obstructive pulmonary disease, unspecified: Secondary | ICD-10-CM | POA: Diagnosis not present

## 2019-10-31 DIAGNOSIS — I10 Essential (primary) hypertension: Secondary | ICD-10-CM | POA: Diagnosis not present

## 2019-10-31 DIAGNOSIS — Z6834 Body mass index (BMI) 34.0-34.9, adult: Secondary | ICD-10-CM | POA: Diagnosis not present

## 2019-10-31 DIAGNOSIS — E1165 Type 2 diabetes mellitus with hyperglycemia: Secondary | ICD-10-CM | POA: Diagnosis not present

## 2019-10-31 DIAGNOSIS — E039 Hypothyroidism, unspecified: Secondary | ICD-10-CM | POA: Diagnosis not present

## 2019-11-26 DIAGNOSIS — I10 Essential (primary) hypertension: Secondary | ICD-10-CM | POA: Diagnosis not present

## 2019-11-27 DIAGNOSIS — S83512D Sprain of anterior cruciate ligament of left knee, subsequent encounter: Secondary | ICD-10-CM | POA: Diagnosis not present

## 2019-12-26 DIAGNOSIS — I10 Essential (primary) hypertension: Secondary | ICD-10-CM | POA: Diagnosis not present

## 2020-01-17 DIAGNOSIS — I10 Essential (primary) hypertension: Secondary | ICD-10-CM | POA: Diagnosis not present

## 2020-01-17 DIAGNOSIS — J449 Chronic obstructive pulmonary disease, unspecified: Secondary | ICD-10-CM | POA: Diagnosis not present

## 2020-01-17 DIAGNOSIS — E119 Type 2 diabetes mellitus without complications: Secondary | ICD-10-CM | POA: Diagnosis not present

## 2020-01-18 DIAGNOSIS — I10 Essential (primary) hypertension: Secondary | ICD-10-CM | POA: Diagnosis not present

## 2020-01-18 DIAGNOSIS — J449 Chronic obstructive pulmonary disease, unspecified: Secondary | ICD-10-CM | POA: Diagnosis not present

## 2020-01-18 DIAGNOSIS — G629 Polyneuropathy, unspecified: Secondary | ICD-10-CM | POA: Diagnosis not present

## 2020-01-18 DIAGNOSIS — E1165 Type 2 diabetes mellitus with hyperglycemia: Secondary | ICD-10-CM | POA: Diagnosis not present

## 2020-01-18 DIAGNOSIS — Z299 Encounter for prophylactic measures, unspecified: Secondary | ICD-10-CM | POA: Diagnosis not present

## 2020-01-18 DIAGNOSIS — F419 Anxiety disorder, unspecified: Secondary | ICD-10-CM | POA: Diagnosis not present

## 2020-01-18 DIAGNOSIS — F17210 Nicotine dependence, cigarettes, uncomplicated: Secondary | ICD-10-CM | POA: Diagnosis not present

## 2020-01-26 DIAGNOSIS — I10 Essential (primary) hypertension: Secondary | ICD-10-CM | POA: Diagnosis not present

## 2020-02-01 DIAGNOSIS — I10 Essential (primary) hypertension: Secondary | ICD-10-CM | POA: Diagnosis not present

## 2020-02-01 DIAGNOSIS — Z299 Encounter for prophylactic measures, unspecified: Secondary | ICD-10-CM | POA: Diagnosis not present

## 2020-02-01 DIAGNOSIS — F419 Anxiety disorder, unspecified: Secondary | ICD-10-CM | POA: Diagnosis not present

## 2020-02-01 DIAGNOSIS — F17210 Nicotine dependence, cigarettes, uncomplicated: Secondary | ICD-10-CM | POA: Diagnosis not present

## 2020-02-01 DIAGNOSIS — E1165 Type 2 diabetes mellitus with hyperglycemia: Secondary | ICD-10-CM | POA: Diagnosis not present

## 2020-02-01 DIAGNOSIS — J449 Chronic obstructive pulmonary disease, unspecified: Secondary | ICD-10-CM | POA: Diagnosis not present

## 2020-02-01 DIAGNOSIS — Z23 Encounter for immunization: Secondary | ICD-10-CM | POA: Diagnosis not present

## 2020-02-25 DIAGNOSIS — J449 Chronic obstructive pulmonary disease, unspecified: Secondary | ICD-10-CM | POA: Diagnosis not present

## 2020-02-25 DIAGNOSIS — E119 Type 2 diabetes mellitus without complications: Secondary | ICD-10-CM | POA: Diagnosis not present

## 2020-02-25 DIAGNOSIS — I10 Essential (primary) hypertension: Secondary | ICD-10-CM | POA: Diagnosis not present

## 2020-03-05 DIAGNOSIS — Z23 Encounter for immunization: Secondary | ICD-10-CM | POA: Diagnosis not present

## 2020-03-27 DIAGNOSIS — E119 Type 2 diabetes mellitus without complications: Secondary | ICD-10-CM | POA: Diagnosis not present

## 2020-03-27 DIAGNOSIS — I10 Essential (primary) hypertension: Secondary | ICD-10-CM | POA: Diagnosis not present

## 2020-03-27 DIAGNOSIS — J449 Chronic obstructive pulmonary disease, unspecified: Secondary | ICD-10-CM | POA: Diagnosis not present

## 2020-04-26 DIAGNOSIS — E119 Type 2 diabetes mellitus without complications: Secondary | ICD-10-CM | POA: Diagnosis not present

## 2020-04-26 DIAGNOSIS — J449 Chronic obstructive pulmonary disease, unspecified: Secondary | ICD-10-CM | POA: Diagnosis not present

## 2020-04-26 DIAGNOSIS — I10 Essential (primary) hypertension: Secondary | ICD-10-CM | POA: Diagnosis not present

## 2020-05-08 DIAGNOSIS — E1165 Type 2 diabetes mellitus with hyperglycemia: Secondary | ICD-10-CM | POA: Diagnosis not present

## 2020-05-08 DIAGNOSIS — I10 Essential (primary) hypertension: Secondary | ICD-10-CM | POA: Diagnosis not present

## 2020-05-08 DIAGNOSIS — J449 Chronic obstructive pulmonary disease, unspecified: Secondary | ICD-10-CM | POA: Diagnosis not present

## 2020-05-08 DIAGNOSIS — Z299 Encounter for prophylactic measures, unspecified: Secondary | ICD-10-CM | POA: Diagnosis not present

## 2020-05-08 DIAGNOSIS — Z6833 Body mass index (BMI) 33.0-33.9, adult: Secondary | ICD-10-CM | POA: Diagnosis not present

## 2020-05-08 DIAGNOSIS — F17210 Nicotine dependence, cigarettes, uncomplicated: Secondary | ICD-10-CM | POA: Diagnosis not present

## 2020-05-08 DIAGNOSIS — Z9289 Personal history of other medical treatment: Secondary | ICD-10-CM | POA: Diagnosis not present

## 2020-05-22 DIAGNOSIS — E119 Type 2 diabetes mellitus without complications: Secondary | ICD-10-CM | POA: Diagnosis not present

## 2020-05-22 DIAGNOSIS — I10 Essential (primary) hypertension: Secondary | ICD-10-CM | POA: Diagnosis not present

## 2020-05-22 DIAGNOSIS — J449 Chronic obstructive pulmonary disease, unspecified: Secondary | ICD-10-CM | POA: Diagnosis not present

## 2020-05-28 DIAGNOSIS — I10 Essential (primary) hypertension: Secondary | ICD-10-CM | POA: Diagnosis not present

## 2020-06-17 DIAGNOSIS — H5211 Myopia, right eye: Secondary | ICD-10-CM | POA: Diagnosis not present

## 2020-06-17 DIAGNOSIS — H5202 Hypermetropia, left eye: Secondary | ICD-10-CM | POA: Diagnosis not present

## 2020-06-17 DIAGNOSIS — E119 Type 2 diabetes mellitus without complications: Secondary | ICD-10-CM | POA: Diagnosis not present

## 2020-06-27 DIAGNOSIS — E119 Type 2 diabetes mellitus without complications: Secondary | ICD-10-CM | POA: Diagnosis not present

## 2020-06-27 DIAGNOSIS — I10 Essential (primary) hypertension: Secondary | ICD-10-CM | POA: Diagnosis not present

## 2020-06-27 DIAGNOSIS — J449 Chronic obstructive pulmonary disease, unspecified: Secondary | ICD-10-CM | POA: Diagnosis not present

## 2020-07-26 DIAGNOSIS — E119 Type 2 diabetes mellitus without complications: Secondary | ICD-10-CM | POA: Diagnosis not present

## 2020-07-26 DIAGNOSIS — I10 Essential (primary) hypertension: Secondary | ICD-10-CM | POA: Diagnosis not present

## 2020-07-26 DIAGNOSIS — J449 Chronic obstructive pulmonary disease, unspecified: Secondary | ICD-10-CM | POA: Diagnosis not present

## 2020-07-27 DIAGNOSIS — I10 Essential (primary) hypertension: Secondary | ICD-10-CM | POA: Diagnosis not present

## 2020-08-13 DIAGNOSIS — E1165 Type 2 diabetes mellitus with hyperglycemia: Secondary | ICD-10-CM | POA: Diagnosis not present

## 2020-08-13 DIAGNOSIS — Z0000 Encounter for general adult medical examination without abnormal findings: Secondary | ICD-10-CM | POA: Diagnosis not present

## 2020-08-13 DIAGNOSIS — Z299 Encounter for prophylactic measures, unspecified: Secondary | ICD-10-CM | POA: Diagnosis not present

## 2020-08-13 DIAGNOSIS — E039 Hypothyroidism, unspecified: Secondary | ICD-10-CM | POA: Diagnosis not present

## 2020-08-13 DIAGNOSIS — Z6834 Body mass index (BMI) 34.0-34.9, adult: Secondary | ICD-10-CM | POA: Diagnosis not present

## 2020-08-13 DIAGNOSIS — F17210 Nicotine dependence, cigarettes, uncomplicated: Secondary | ICD-10-CM | POA: Diagnosis not present

## 2020-08-13 DIAGNOSIS — I10 Essential (primary) hypertension: Secondary | ICD-10-CM | POA: Diagnosis not present

## 2020-08-13 DIAGNOSIS — E7800 Pure hypercholesterolemia, unspecified: Secondary | ICD-10-CM | POA: Diagnosis not present

## 2020-08-13 DIAGNOSIS — Z1331 Encounter for screening for depression: Secondary | ICD-10-CM | POA: Diagnosis not present

## 2020-08-13 DIAGNOSIS — R5383 Other fatigue: Secondary | ICD-10-CM | POA: Diagnosis not present

## 2020-08-13 DIAGNOSIS — Z1339 Encounter for screening examination for other mental health and behavioral disorders: Secondary | ICD-10-CM | POA: Diagnosis not present

## 2020-08-13 DIAGNOSIS — Z7189 Other specified counseling: Secondary | ICD-10-CM | POA: Diagnosis not present

## 2020-08-27 DIAGNOSIS — I10 Essential (primary) hypertension: Secondary | ICD-10-CM | POA: Diagnosis not present

## 2020-09-26 DIAGNOSIS — I10 Essential (primary) hypertension: Secondary | ICD-10-CM | POA: Diagnosis not present

## 2020-10-28 DIAGNOSIS — I10 Essential (primary) hypertension: Secondary | ICD-10-CM | POA: Diagnosis not present

## 2020-11-20 DIAGNOSIS — F17210 Nicotine dependence, cigarettes, uncomplicated: Secondary | ICD-10-CM | POA: Diagnosis not present

## 2020-11-20 DIAGNOSIS — I10 Essential (primary) hypertension: Secondary | ICD-10-CM | POA: Diagnosis not present

## 2020-11-20 DIAGNOSIS — E1165 Type 2 diabetes mellitus with hyperglycemia: Secondary | ICD-10-CM | POA: Diagnosis not present

## 2020-11-20 DIAGNOSIS — F419 Anxiety disorder, unspecified: Secondary | ICD-10-CM | POA: Diagnosis not present

## 2020-11-20 DIAGNOSIS — J449 Chronic obstructive pulmonary disease, unspecified: Secondary | ICD-10-CM | POA: Diagnosis not present

## 2020-11-20 DIAGNOSIS — E1142 Type 2 diabetes mellitus with diabetic polyneuropathy: Secondary | ICD-10-CM | POA: Diagnosis not present

## 2020-11-20 DIAGNOSIS — Z6834 Body mass index (BMI) 34.0-34.9, adult: Secondary | ICD-10-CM | POA: Diagnosis not present

## 2020-11-20 DIAGNOSIS — Z299 Encounter for prophylactic measures, unspecified: Secondary | ICD-10-CM | POA: Diagnosis not present

## 2020-11-25 DIAGNOSIS — I10 Essential (primary) hypertension: Secondary | ICD-10-CM | POA: Diagnosis not present

## 2020-12-26 DIAGNOSIS — I10 Essential (primary) hypertension: Secondary | ICD-10-CM | POA: Diagnosis not present

## 2021-01-22 DIAGNOSIS — F17210 Nicotine dependence, cigarettes, uncomplicated: Secondary | ICD-10-CM | POA: Diagnosis not present

## 2021-01-22 DIAGNOSIS — Z299 Encounter for prophylactic measures, unspecified: Secondary | ICD-10-CM | POA: Diagnosis not present

## 2021-01-22 DIAGNOSIS — I10 Essential (primary) hypertension: Secondary | ICD-10-CM | POA: Diagnosis not present

## 2021-01-22 DIAGNOSIS — F419 Anxiety disorder, unspecified: Secondary | ICD-10-CM | POA: Diagnosis not present

## 2021-01-22 DIAGNOSIS — Z6834 Body mass index (BMI) 34.0-34.9, adult: Secondary | ICD-10-CM | POA: Diagnosis not present

## 2021-01-24 DIAGNOSIS — I10 Essential (primary) hypertension: Secondary | ICD-10-CM | POA: Diagnosis not present

## 2021-02-25 DIAGNOSIS — I10 Essential (primary) hypertension: Secondary | ICD-10-CM | POA: Diagnosis not present

## 2021-03-11 DIAGNOSIS — R569 Unspecified convulsions: Secondary | ICD-10-CM | POA: Diagnosis not present

## 2021-03-11 DIAGNOSIS — R404 Transient alteration of awareness: Secondary | ICD-10-CM | POA: Diagnosis not present

## 2021-03-11 DIAGNOSIS — E1165 Type 2 diabetes mellitus with hyperglycemia: Secondary | ICD-10-CM | POA: Diagnosis not present

## 2021-03-11 DIAGNOSIS — R0902 Hypoxemia: Secondary | ICD-10-CM | POA: Diagnosis not present

## 2021-03-11 DIAGNOSIS — R4020 Unspecified coma: Secondary | ICD-10-CM | POA: Diagnosis not present

## 2021-03-12 DIAGNOSIS — R42 Dizziness and giddiness: Secondary | ICD-10-CM | POA: Diagnosis not present

## 2021-03-12 DIAGNOSIS — R55 Syncope and collapse: Secondary | ICD-10-CM | POA: Diagnosis not present

## 2021-03-12 DIAGNOSIS — Z79890 Hormone replacement therapy (postmenopausal): Secondary | ICD-10-CM | POA: Diagnosis not present

## 2021-03-12 DIAGNOSIS — I348 Other nonrheumatic mitral valve disorders: Secondary | ICD-10-CM | POA: Diagnosis not present

## 2021-03-12 DIAGNOSIS — M4322 Fusion of spine, cervical region: Secondary | ICD-10-CM | POA: Diagnosis not present

## 2021-03-12 DIAGNOSIS — E8770 Fluid overload, unspecified: Secondary | ICD-10-CM | POA: Diagnosis not present

## 2021-03-12 DIAGNOSIS — K219 Gastro-esophageal reflux disease without esophagitis: Secondary | ICD-10-CM | POA: Diagnosis present

## 2021-03-12 DIAGNOSIS — Z79891 Long term (current) use of opiate analgesic: Secondary | ICD-10-CM | POA: Diagnosis not present

## 2021-03-12 DIAGNOSIS — J9602 Acute respiratory failure with hypercapnia: Secondary | ICD-10-CM | POA: Diagnosis not present

## 2021-03-12 DIAGNOSIS — Z9989 Dependence on other enabling machines and devices: Secondary | ICD-10-CM | POA: Diagnosis not present

## 2021-03-12 DIAGNOSIS — J948 Other specified pleural conditions: Secondary | ICD-10-CM | POA: Diagnosis not present

## 2021-03-12 DIAGNOSIS — I160 Hypertensive urgency: Secondary | ICD-10-CM | POA: Diagnosis not present

## 2021-03-12 DIAGNOSIS — I5033 Acute on chronic diastolic (congestive) heart failure: Secondary | ICD-10-CM | POA: Diagnosis present

## 2021-03-12 DIAGNOSIS — J9612 Chronic respiratory failure with hypercapnia: Secondary | ICD-10-CM | POA: Diagnosis not present

## 2021-03-12 DIAGNOSIS — Z720 Tobacco use: Secondary | ICD-10-CM | POA: Diagnosis not present

## 2021-03-12 DIAGNOSIS — Z452 Encounter for adjustment and management of vascular access device: Secondary | ICD-10-CM | POA: Diagnosis not present

## 2021-03-12 DIAGNOSIS — R918 Other nonspecific abnormal finding of lung field: Secondary | ICD-10-CM | POA: Diagnosis not present

## 2021-03-12 DIAGNOSIS — Z791 Long term (current) use of non-steroidal anti-inflammatories (NSAID): Secondary | ICD-10-CM | POA: Diagnosis not present

## 2021-03-12 DIAGNOSIS — J9611 Chronic respiratory failure with hypoxia: Secondary | ICD-10-CM | POA: Diagnosis not present

## 2021-03-12 DIAGNOSIS — F17210 Nicotine dependence, cigarettes, uncomplicated: Secondary | ICD-10-CM | POA: Diagnosis not present

## 2021-03-12 DIAGNOSIS — R0989 Other specified symptoms and signs involving the circulatory and respiratory systems: Secondary | ICD-10-CM | POA: Diagnosis not present

## 2021-03-12 DIAGNOSIS — Z7984 Long term (current) use of oral hypoglycemic drugs: Secondary | ICD-10-CM | POA: Diagnosis not present

## 2021-03-12 DIAGNOSIS — I110 Hypertensive heart disease with heart failure: Secondary | ICD-10-CM | POA: Diagnosis present

## 2021-03-12 DIAGNOSIS — J90 Pleural effusion, not elsewhere classified: Secondary | ICD-10-CM | POA: Diagnosis not present

## 2021-03-12 DIAGNOSIS — J690 Pneumonitis due to inhalation of food and vomit: Secondary | ICD-10-CM | POA: Diagnosis not present

## 2021-03-12 DIAGNOSIS — J95811 Postprocedural pneumothorax: Secondary | ICD-10-CM | POA: Diagnosis present

## 2021-03-12 DIAGNOSIS — R54 Age-related physical debility: Secondary | ICD-10-CM | POA: Diagnosis present

## 2021-03-12 DIAGNOSIS — I313 Pericardial effusion (noninflammatory): Secondary | ICD-10-CM | POA: Diagnosis not present

## 2021-03-12 DIAGNOSIS — I951 Orthostatic hypotension: Secondary | ICD-10-CM | POA: Diagnosis not present

## 2021-03-12 DIAGNOSIS — F419 Anxiety disorder, unspecified: Secondary | ICD-10-CM | POA: Diagnosis not present

## 2021-03-12 DIAGNOSIS — E669 Obesity, unspecified: Secondary | ICD-10-CM | POA: Diagnosis present

## 2021-03-12 DIAGNOSIS — R292 Abnormal reflex: Secondary | ICD-10-CM | POA: Diagnosis not present

## 2021-03-12 DIAGNOSIS — J449 Chronic obstructive pulmonary disease, unspecified: Secondary | ICD-10-CM | POA: Diagnosis not present

## 2021-03-12 DIAGNOSIS — J810 Acute pulmonary edema: Secondary | ICD-10-CM | POA: Diagnosis not present

## 2021-03-12 DIAGNOSIS — I517 Cardiomegaly: Secondary | ICD-10-CM | POA: Diagnosis not present

## 2021-03-12 DIAGNOSIS — R4182 Altered mental status, unspecified: Secondary | ICD-10-CM | POA: Diagnosis not present

## 2021-03-12 DIAGNOSIS — Z9911 Dependence on respirator [ventilator] status: Secondary | ICD-10-CM | POA: Diagnosis not present

## 2021-03-12 DIAGNOSIS — G4733 Obstructive sleep apnea (adult) (pediatric): Secondary | ICD-10-CM | POA: Diagnosis present

## 2021-03-12 DIAGNOSIS — E119 Type 2 diabetes mellitus without complications: Secondary | ICD-10-CM | POA: Diagnosis not present

## 2021-03-12 DIAGNOSIS — I5030 Unspecified diastolic (congestive) heart failure: Secondary | ICD-10-CM | POA: Diagnosis not present

## 2021-03-12 DIAGNOSIS — Z6830 Body mass index (BMI) 30.0-30.9, adult: Secondary | ICD-10-CM | POA: Diagnosis not present

## 2021-03-12 DIAGNOSIS — Z20822 Contact with and (suspected) exposure to covid-19: Secondary | ICD-10-CM | POA: Diagnosis not present

## 2021-03-12 DIAGNOSIS — R911 Solitary pulmonary nodule: Secondary | ICD-10-CM | POA: Diagnosis not present

## 2021-03-12 DIAGNOSIS — M549 Dorsalgia, unspecified: Secondary | ICD-10-CM | POA: Diagnosis present

## 2021-03-12 DIAGNOSIS — E039 Hypothyroidism, unspecified: Secondary | ICD-10-CM | POA: Diagnosis present

## 2021-03-12 DIAGNOSIS — Z4682 Encounter for fitting and adjustment of non-vascular catheter: Secondary | ICD-10-CM | POA: Diagnosis not present

## 2021-03-12 DIAGNOSIS — J939 Pneumothorax, unspecified: Secondary | ICD-10-CM | POA: Diagnosis not present

## 2021-03-12 DIAGNOSIS — F411 Generalized anxiety disorder: Secondary | ICD-10-CM | POA: Diagnosis present

## 2021-03-12 DIAGNOSIS — Z79899 Other long term (current) drug therapy: Secondary | ICD-10-CM | POA: Diagnosis not present

## 2021-03-12 DIAGNOSIS — R296 Repeated falls: Secondary | ICD-10-CM | POA: Diagnosis not present

## 2021-03-12 DIAGNOSIS — J811 Chronic pulmonary edema: Secondary | ICD-10-CM | POA: Diagnosis not present

## 2021-03-12 DIAGNOSIS — J439 Emphysema, unspecified: Secondary | ICD-10-CM | POA: Diagnosis not present

## 2021-03-12 DIAGNOSIS — R140 Abdominal distension (gaseous): Secondary | ICD-10-CM | POA: Diagnosis not present

## 2021-03-12 DIAGNOSIS — J9690 Respiratory failure, unspecified, unspecified whether with hypoxia or hypercapnia: Secondary | ICD-10-CM | POA: Diagnosis not present

## 2021-03-12 DIAGNOSIS — I10 Essential (primary) hypertension: Secondary | ICD-10-CM | POA: Diagnosis not present

## 2021-03-12 DIAGNOSIS — J9621 Acute and chronic respiratory failure with hypoxia: Secondary | ICD-10-CM | POA: Diagnosis not present

## 2021-03-12 DIAGNOSIS — D689 Coagulation defect, unspecified: Secondary | ICD-10-CM | POA: Diagnosis present

## 2021-03-12 DIAGNOSIS — M797 Fibromyalgia: Secondary | ICD-10-CM | POA: Diagnosis present

## 2021-03-12 DIAGNOSIS — Z7951 Long term (current) use of inhaled steroids: Secondary | ICD-10-CM | POA: Diagnosis not present

## 2021-03-12 DIAGNOSIS — E876 Hypokalemia: Secondary | ICD-10-CM | POA: Diagnosis present

## 2021-03-12 DIAGNOSIS — K8020 Calculus of gallbladder without cholecystitis without obstruction: Secondary | ICD-10-CM | POA: Diagnosis not present

## 2021-03-12 DIAGNOSIS — E785 Hyperlipidemia, unspecified: Secondary | ICD-10-CM | POA: Diagnosis present

## 2021-03-12 DIAGNOSIS — J9811 Atelectasis: Secondary | ICD-10-CM | POA: Diagnosis not present

## 2021-03-12 DIAGNOSIS — Z981 Arthrodesis status: Secondary | ICD-10-CM | POA: Diagnosis not present

## 2021-03-25 DIAGNOSIS — K219 Gastro-esophageal reflux disease without esophagitis: Secondary | ICD-10-CM | POA: Diagnosis not present

## 2021-03-25 DIAGNOSIS — E119 Type 2 diabetes mellitus without complications: Secondary | ICD-10-CM | POA: Diagnosis not present

## 2021-03-25 DIAGNOSIS — J810 Acute pulmonary edema: Secondary | ICD-10-CM | POA: Diagnosis not present

## 2021-03-25 DIAGNOSIS — I10 Essential (primary) hypertension: Secondary | ICD-10-CM | POA: Diagnosis not present

## 2021-03-25 DIAGNOSIS — M549 Dorsalgia, unspecified: Secondary | ICD-10-CM | POA: Diagnosis not present

## 2021-03-25 DIAGNOSIS — J9612 Chronic respiratory failure with hypercapnia: Secondary | ICD-10-CM | POA: Diagnosis not present

## 2021-03-25 DIAGNOSIS — G4733 Obstructive sleep apnea (adult) (pediatric): Secondary | ICD-10-CM | POA: Diagnosis not present

## 2021-03-25 DIAGNOSIS — E785 Hyperlipidemia, unspecified: Secondary | ICD-10-CM | POA: Diagnosis not present

## 2021-03-25 DIAGNOSIS — F419 Anxiety disorder, unspecified: Secondary | ICD-10-CM | POA: Diagnosis not present

## 2021-03-25 DIAGNOSIS — J449 Chronic obstructive pulmonary disease, unspecified: Secondary | ICD-10-CM | POA: Diagnosis not present

## 2021-03-25 DIAGNOSIS — Z7984 Long term (current) use of oral hypoglycemic drugs: Secondary | ICD-10-CM | POA: Diagnosis not present

## 2021-03-25 DIAGNOSIS — J9383 Other pneumothorax: Secondary | ICD-10-CM | POA: Diagnosis not present

## 2021-03-25 DIAGNOSIS — F17200 Nicotine dependence, unspecified, uncomplicated: Secondary | ICD-10-CM | POA: Diagnosis not present

## 2021-03-25 DIAGNOSIS — J9611 Chronic respiratory failure with hypoxia: Secondary | ICD-10-CM | POA: Diagnosis not present

## 2021-03-25 DIAGNOSIS — Z6831 Body mass index (BMI) 31.0-31.9, adult: Secondary | ICD-10-CM | POA: Diagnosis not present

## 2021-03-25 DIAGNOSIS — G8929 Other chronic pain: Secondary | ICD-10-CM | POA: Diagnosis not present

## 2021-03-26 DIAGNOSIS — J9611 Chronic respiratory failure with hypoxia: Secondary | ICD-10-CM | POA: Diagnosis not present

## 2021-03-26 DIAGNOSIS — E1165 Type 2 diabetes mellitus with hyperglycemia: Secondary | ICD-10-CM | POA: Diagnosis not present

## 2021-03-26 DIAGNOSIS — Z299 Encounter for prophylactic measures, unspecified: Secondary | ICD-10-CM | POA: Diagnosis not present

## 2021-03-26 DIAGNOSIS — Z6834 Body mass index (BMI) 34.0-34.9, adult: Secondary | ICD-10-CM | POA: Diagnosis not present

## 2021-03-26 DIAGNOSIS — I10 Essential (primary) hypertension: Secondary | ICD-10-CM | POA: Diagnosis not present

## 2021-03-26 DIAGNOSIS — J939 Pneumothorax, unspecified: Secondary | ICD-10-CM | POA: Diagnosis not present

## 2021-03-27 DIAGNOSIS — I10 Essential (primary) hypertension: Secondary | ICD-10-CM | POA: Diagnosis not present

## 2021-03-28 DIAGNOSIS — J9612 Chronic respiratory failure with hypercapnia: Secondary | ICD-10-CM | POA: Diagnosis not present

## 2021-03-28 DIAGNOSIS — J449 Chronic obstructive pulmonary disease, unspecified: Secondary | ICD-10-CM | POA: Diagnosis not present

## 2021-03-28 DIAGNOSIS — J810 Acute pulmonary edema: Secondary | ICD-10-CM | POA: Diagnosis not present

## 2021-03-28 DIAGNOSIS — J9611 Chronic respiratory failure with hypoxia: Secondary | ICD-10-CM | POA: Diagnosis not present

## 2021-03-28 DIAGNOSIS — I10 Essential (primary) hypertension: Secondary | ICD-10-CM | POA: Diagnosis not present

## 2021-03-28 DIAGNOSIS — J9383 Other pneumothorax: Secondary | ICD-10-CM | POA: Diagnosis not present

## 2021-03-29 DIAGNOSIS — J9383 Other pneumothorax: Secondary | ICD-10-CM | POA: Diagnosis not present

## 2021-03-29 DIAGNOSIS — J9612 Chronic respiratory failure with hypercapnia: Secondary | ICD-10-CM | POA: Diagnosis not present

## 2021-03-29 DIAGNOSIS — J449 Chronic obstructive pulmonary disease, unspecified: Secondary | ICD-10-CM | POA: Diagnosis not present

## 2021-03-29 DIAGNOSIS — J9611 Chronic respiratory failure with hypoxia: Secondary | ICD-10-CM | POA: Diagnosis not present

## 2021-03-29 DIAGNOSIS — J810 Acute pulmonary edema: Secondary | ICD-10-CM | POA: Diagnosis not present

## 2021-03-29 DIAGNOSIS — I10 Essential (primary) hypertension: Secondary | ICD-10-CM | POA: Diagnosis not present

## 2021-03-31 DIAGNOSIS — J449 Chronic obstructive pulmonary disease, unspecified: Secondary | ICD-10-CM | POA: Diagnosis not present

## 2021-03-31 DIAGNOSIS — J9612 Chronic respiratory failure with hypercapnia: Secondary | ICD-10-CM | POA: Diagnosis not present

## 2021-03-31 DIAGNOSIS — J9611 Chronic respiratory failure with hypoxia: Secondary | ICD-10-CM | POA: Diagnosis not present

## 2021-03-31 DIAGNOSIS — J9383 Other pneumothorax: Secondary | ICD-10-CM | POA: Diagnosis not present

## 2021-03-31 DIAGNOSIS — J810 Acute pulmonary edema: Secondary | ICD-10-CM | POA: Diagnosis not present

## 2021-03-31 DIAGNOSIS — I10 Essential (primary) hypertension: Secondary | ICD-10-CM | POA: Diagnosis not present

## 2021-04-03 DIAGNOSIS — J449 Chronic obstructive pulmonary disease, unspecified: Secondary | ICD-10-CM | POA: Diagnosis not present

## 2021-04-03 DIAGNOSIS — J9612 Chronic respiratory failure with hypercapnia: Secondary | ICD-10-CM | POA: Diagnosis not present

## 2021-04-03 DIAGNOSIS — J810 Acute pulmonary edema: Secondary | ICD-10-CM | POA: Diagnosis not present

## 2021-04-03 DIAGNOSIS — I10 Essential (primary) hypertension: Secondary | ICD-10-CM | POA: Diagnosis not present

## 2021-04-03 DIAGNOSIS — J9383 Other pneumothorax: Secondary | ICD-10-CM | POA: Diagnosis not present

## 2021-04-03 DIAGNOSIS — J9611 Chronic respiratory failure with hypoxia: Secondary | ICD-10-CM | POA: Diagnosis not present

## 2021-04-04 DIAGNOSIS — I10 Essential (primary) hypertension: Secondary | ICD-10-CM | POA: Diagnosis not present

## 2021-04-04 DIAGNOSIS — J449 Chronic obstructive pulmonary disease, unspecified: Secondary | ICD-10-CM | POA: Diagnosis not present

## 2021-04-04 DIAGNOSIS — J9611 Chronic respiratory failure with hypoxia: Secondary | ICD-10-CM | POA: Diagnosis not present

## 2021-04-04 DIAGNOSIS — J9612 Chronic respiratory failure with hypercapnia: Secondary | ICD-10-CM | POA: Diagnosis not present

## 2021-04-04 DIAGNOSIS — J9383 Other pneumothorax: Secondary | ICD-10-CM | POA: Diagnosis not present

## 2021-04-04 DIAGNOSIS — J810 Acute pulmonary edema: Secondary | ICD-10-CM | POA: Diagnosis not present

## 2021-04-07 DIAGNOSIS — J9383 Other pneumothorax: Secondary | ICD-10-CM | POA: Diagnosis not present

## 2021-04-07 DIAGNOSIS — J810 Acute pulmonary edema: Secondary | ICD-10-CM | POA: Diagnosis not present

## 2021-04-07 DIAGNOSIS — I10 Essential (primary) hypertension: Secondary | ICD-10-CM | POA: Diagnosis not present

## 2021-04-07 DIAGNOSIS — J9612 Chronic respiratory failure with hypercapnia: Secondary | ICD-10-CM | POA: Diagnosis not present

## 2021-04-07 DIAGNOSIS — J449 Chronic obstructive pulmonary disease, unspecified: Secondary | ICD-10-CM | POA: Diagnosis not present

## 2021-04-07 DIAGNOSIS — J9611 Chronic respiratory failure with hypoxia: Secondary | ICD-10-CM | POA: Diagnosis not present

## 2021-04-08 DIAGNOSIS — M25561 Pain in right knee: Secondary | ICD-10-CM | POA: Diagnosis not present

## 2021-04-08 DIAGNOSIS — J9383 Other pneumothorax: Secondary | ICD-10-CM | POA: Diagnosis not present

## 2021-04-08 DIAGNOSIS — Z6834 Body mass index (BMI) 34.0-34.9, adult: Secondary | ICD-10-CM | POA: Diagnosis not present

## 2021-04-08 DIAGNOSIS — Z299 Encounter for prophylactic measures, unspecified: Secondary | ICD-10-CM | POA: Diagnosis not present

## 2021-04-08 DIAGNOSIS — J449 Chronic obstructive pulmonary disease, unspecified: Secondary | ICD-10-CM | POA: Diagnosis not present

## 2021-04-08 DIAGNOSIS — Z87891 Personal history of nicotine dependence: Secondary | ICD-10-CM | POA: Diagnosis not present

## 2021-04-08 DIAGNOSIS — J810 Acute pulmonary edema: Secondary | ICD-10-CM | POA: Diagnosis not present

## 2021-04-08 DIAGNOSIS — J9611 Chronic respiratory failure with hypoxia: Secondary | ICD-10-CM | POA: Diagnosis not present

## 2021-04-08 DIAGNOSIS — Y92009 Unspecified place in unspecified non-institutional (private) residence as the place of occurrence of the external cause: Secondary | ICD-10-CM | POA: Diagnosis not present

## 2021-04-08 DIAGNOSIS — W19XXXA Unspecified fall, initial encounter: Secondary | ICD-10-CM | POA: Diagnosis not present

## 2021-04-08 DIAGNOSIS — I10 Essential (primary) hypertension: Secondary | ICD-10-CM | POA: Diagnosis not present

## 2021-04-09 ENCOUNTER — Ambulatory Visit (INDEPENDENT_AMBULATORY_CARE_PROVIDER_SITE_OTHER): Payer: Medicare Other

## 2021-04-09 ENCOUNTER — Encounter: Payer: Self-pay | Admitting: Orthopaedic Surgery

## 2021-04-09 ENCOUNTER — Other Ambulatory Visit: Payer: Self-pay

## 2021-04-09 ENCOUNTER — Ambulatory Visit (INDEPENDENT_AMBULATORY_CARE_PROVIDER_SITE_OTHER): Payer: Medicare Other | Admitting: Orthopaedic Surgery

## 2021-04-09 VITALS — Ht 61.0 in | Wt 184.0 lb

## 2021-04-09 DIAGNOSIS — G8929 Other chronic pain: Secondary | ICD-10-CM

## 2021-04-09 DIAGNOSIS — M25561 Pain in right knee: Secondary | ICD-10-CM

## 2021-04-09 DIAGNOSIS — M25551 Pain in right hip: Secondary | ICD-10-CM

## 2021-04-09 DIAGNOSIS — Z6834 Body mass index (BMI) 34.0-34.9, adult: Secondary | ICD-10-CM | POA: Diagnosis not present

## 2021-04-09 DIAGNOSIS — E1165 Type 2 diabetes mellitus with hyperglycemia: Secondary | ICD-10-CM | POA: Diagnosis not present

## 2021-04-09 DIAGNOSIS — I10 Essential (primary) hypertension: Secondary | ICD-10-CM | POA: Diagnosis not present

## 2021-04-09 DIAGNOSIS — J449 Chronic obstructive pulmonary disease, unspecified: Secondary | ICD-10-CM | POA: Diagnosis not present

## 2021-04-09 DIAGNOSIS — Z299 Encounter for prophylactic measures, unspecified: Secondary | ICD-10-CM | POA: Diagnosis not present

## 2021-04-09 DIAGNOSIS — M1710 Unilateral primary osteoarthritis, unspecified knee: Secondary | ICD-10-CM | POA: Diagnosis not present

## 2021-04-09 MED ORDER — BUPIVACAINE HCL 0.25 % IJ SOLN
2.0000 mL | INTRAMUSCULAR | Status: AC | PRN
  Administered 2021-04-09: 2 mL via INTRA_ARTICULAR

## 2021-04-09 NOTE — Progress Notes (Signed)
Office Visit Note   Patient: Patricia Stein           Date of Birth: May 29, 1946           MRN: [id number] Visit Date: 04/09/2021              Requested by: Kirstie Peri, MD [address] [place],  [place] [address] PCP: Kirstie Peri, MD   Assessment & Plan: Visit Diagnoses:  1. Pain in right hip   2. Chronic pain of right knee   3. Acute pain of right knee     Plan: Patricia Stein is accompanied by her son and seen for evaluation of relatively cute onset of right lower extremity pain.  3 to 4 weeks ago she had a respiratory issue and loss of consciousness.  She was admitted to [facility] and then transferred by helicopter to [facility].  She was placed on a respirator and eventually regained consciousness.  According to the family the not sure what created the problem but within 2 days of her returning home she developed this pain in her right leg and has had difficulty bearing any weight.  This but no obvious injury or trauma.  X-rays were negative for any acute changes of the pelvis, right hip and right knee.  She did have an effusion of her right knee.  I aspirated about 40 cc of bloody fluid and will send the fluid to the lab.  I injected betamethasone and Marcaine and hope that it makes a difference.  She seemed to have better range of motion and less pain when she left the office.  I like to see her back in about 2 weeks.  She has an appointment to see her primary care physician this afternoon as well.  I question whether or not some of her problem might be referred from her back but we will wait to see how she responds to the injection  Follow-Up Instructions: Return 2 weeks.   Orders:  Orders Placed This Encounter  Procedures   Large Joint Inj: R knee   XR KNEE 3 VIEW RIGHT   XR HIP UNILAT W OR W/O PELVIS 2-3 VIEWS RIGHT   No orders of the defined types were placed in this encounter.     Procedures: Large Joint Inj: R knee on 04/09/2021 12:36 PM Indications: pain and  diagnostic evaluation Details: 25 G 1.5 in needle, anteromedial approach  Arthrogram: No  Medications: 2 mL bupivacaine 0.25 % Aspirate: 40 mL blood-tinged; sent for lab analysis  12 mg betamethasone injected with Marcaine into left knee after aspiration Procedure, treatment alternatives, risks and benefits explained, specific risks discussed. Consent was given by the patient. Immediately prior to procedure a time out was called to verify the correct patient, procedure, equipment, support staff and site/side marked as required. Patient was prepped and draped in the usual sterile fashion.      Clinical Data: No additional findings.   Subjective: Chief Complaint  Patient presents with   Left Leg - Pain   Right Leg - Pain  Patient presents today for bilateral leg and knee pain. She said that her knee and leg pain started in June after a 10day visit in the hospital. She cannot remember the details on why or how long she was in the hospital. She said that her right leg is worse than the left. She has pain that radiates up and also down to her foot. She has extreme difficulty lifting either leg. She  is in a wheelchair today and had to have assistance to get into it. She takes Tylenol but states that it does not help. She has a history of 7-8 lower back surgeries.   HPI  Review of Systems   Objective: Vital Signs: Ht 5\' 1"  (1.549 m)   Wt 184 lb (83.5 kg)   BMI 34.77 kg/m   Physical Exam Constitutional:      Appearance: She is well-developed.  Pulmonary:     Effort: Pulmonary effort is normal.  Skin:    General: Skin is warm and dry.  Neurological:     Mental Status: She is alert and oriented to person, place, and time.  Psychiatric:        Behavior: Behavior normal.    Ortho Exam considerable pain in multiple areas about her right leg with some mild nonpitting edema of the ankle.  Motor exam appeared to be intact patient has good sensation to the tips of her toes.  She had a  lot of pain but about the right knee with a positive effusion.  The knee was minimally warm.  Also had some tenderness about her thigh and along the lateral aspect of her hip.  It appeared that the problem was centered about the knee.  Motion was limited in her knee based on her pain  Specialty Comments:  No specialty comments available.  Imaging: XR HIP UNILAT W OR W/O PELVIS 2-3 VIEWS RIGHT  Result Date: 04/09/2021 AP pelvis and lateral of the right hip are obtained without evidence of fracture.  The hip appeared to be clear of any fracture as well as the pubic rami.  No lesions in the femoral shaft.  Review of the lumbar spine demonstrates instrumentation extending to the L4-5 vertebral body.  L5-S1 space is open  XR KNEE 3 VIEW RIGHT  Result Date: 04/09/2021 Films of the right knee are obtained in 3 projections.  The joint spaces are well-maintained with normal alignment.  I did not see any acute changes.  Patella tracks in the midline.  No osteophytes or obvious subchondral sclerosis.  There was some body calcification in the femoral artery and popliteal artery.  Patient is diabetic.  I did not see any ectopic calcification    PMFS History: Patient Active Problem List   Diagnosis Date Noted   Pain in right knee 04/09/2021   Chronic respiratory failure with hypoxia and hypercapnia (HCC) 11/06/2017   Cigarette smoker 11/06/2017   Cervical spondylosis with radiculopathy 12/14/2014   Preoperative clearance 12/12/2014   Hypotension 11/02/2012   Urticaria 10/30/2012   Back pain 10/30/2012   Diabetes mellitus (HCC) 10/30/2012   HTN (hypertension) 10/30/2012   Hypothyroidism 10/30/2012   GERD (gastroesophageal reflux disease) 10/30/2012   COPD GOLD II with ex desats and borderline hypercarbia ? due to ohs  10/30/2012   OSA on CPAP 10/30/2012   Lumbar stenosis with neurogenic claudication 10/14/2012   Lumbar vertebral fracture (HCC) 10/13/2011   Dyspnea on exertion 05/13/2010    HYPERTRIGLYCERIDEMIA 04/11/2010   Obesity 04/11/2010   MURMUR 04/11/2010   CAROTID BRUIT 04/11/2010   UNSPECIFIED HYPOTHYROIDISM 04/08/2010   DM 04/08/2010   ANXIETY STATE, UNSPECIFIED 04/08/2010   Tobacco use disorder 04/08/2010   HYPERTENSION 04/08/2010   Chest pain 04/08/2010   Past Medical History:  Diagnosis Date   Anxiety    Asthma    Childhood   Cataracts, both eyes    each eye has had catarcts removed   Complication of anesthesia  pt reports with ~ 2010 they had trouble with her BP and getting it up she went to recovery room with welps on her unsure  what cause reaction   COPD (chronic obstructive pulmonary disease) (HCC)    Diabetes mellitus    Fibromyalgia    GERD (gastroesophageal reflux disease)    Heart murmur    small per pt see [facility] cardiolgist last in 2011 (trace MR)   Hypercholesteremia    pt reports that her levels are really high   Hypertension    stress test done in 2011, last seen heart MD in 2011   Hypertriglyceridemia    Hypothyroidism    Shortness of breath    Sleep apnea    uses CPAP with oxygen sleep study done ~ 2 years ago in [place] at [facility]    Family History  Problem Relation Age of Onset   Lung cancer Father    Lung cancer Brother    Anesthesia problems Neg Hx     Past Surgical History:  Procedure Laterality Date   ABDOMINAL HYSTERECTOMY     ANTERIOR CERVICAL DECOMP/DISCECTOMY FUSION N/A 12/14/2014   Procedure: CERICAL FIVE-CERVICAL SIX, CERVICAL SIX-CERVICAL SEVEN ANTERIOR CERVICAL DECOMPRESSION/DISCECTOMY FUSION 2 LEVELS;  Surgeon: Julio Sicks, MD;  Location: [facility];  Service: Neurosurgery;  Laterality: N/A;   ANTERIOR LAT LUMBAR FUSION  10/14/2012   Procedure: ANTERIOR LATERAL LUMBAR FUSION 1 LEVEL;  Surgeon: Temple Pacini, MD;  Location: [facility];  Service: Neurosurgery;  Laterality: Left;   BACK SURGERY     x6   CARPAL TUNNEL RELEASE     TONSILLECTOMY     TUBAL LIGATION     Social History   Occupational  History   Not on file  Tobacco Use   Smoking status: Every Day    Packs/day: 2.00    Years: 41.00    Pack years: 82.00    Types: Cigarettes    Start date: 11/22/1975   Smokeless tobacco: Never  Vaping Use   Vaping Use: Never used  Substance and Sexual Activity   Alcohol use: No    Alcohol/week: 0.0 standard drinks   Drug use: No   Sexual activity: Not Currently

## 2021-04-14 DIAGNOSIS — J449 Chronic obstructive pulmonary disease, unspecified: Secondary | ICD-10-CM | POA: Diagnosis not present

## 2021-04-14 DIAGNOSIS — J9383 Other pneumothorax: Secondary | ICD-10-CM | POA: Diagnosis not present

## 2021-04-14 DIAGNOSIS — J810 Acute pulmonary edema: Secondary | ICD-10-CM | POA: Diagnosis not present

## 2021-04-14 DIAGNOSIS — J9611 Chronic respiratory failure with hypoxia: Secondary | ICD-10-CM | POA: Diagnosis not present

## 2021-04-14 DIAGNOSIS — I10 Essential (primary) hypertension: Secondary | ICD-10-CM | POA: Diagnosis not present

## 2021-04-14 DIAGNOSIS — J9612 Chronic respiratory failure with hypercapnia: Secondary | ICD-10-CM | POA: Diagnosis not present

## 2021-04-16 DIAGNOSIS — J9611 Chronic respiratory failure with hypoxia: Secondary | ICD-10-CM | POA: Diagnosis not present

## 2021-04-16 DIAGNOSIS — I10 Essential (primary) hypertension: Secondary | ICD-10-CM | POA: Diagnosis not present

## 2021-04-16 DIAGNOSIS — J9383 Other pneumothorax: Secondary | ICD-10-CM | POA: Diagnosis not present

## 2021-04-16 DIAGNOSIS — J449 Chronic obstructive pulmonary disease, unspecified: Secondary | ICD-10-CM | POA: Diagnosis not present

## 2021-04-16 DIAGNOSIS — J9612 Chronic respiratory failure with hypercapnia: Secondary | ICD-10-CM | POA: Diagnosis not present

## 2021-04-16 DIAGNOSIS — J810 Acute pulmonary edema: Secondary | ICD-10-CM | POA: Diagnosis not present

## 2021-04-23 DIAGNOSIS — J449 Chronic obstructive pulmonary disease, unspecified: Secondary | ICD-10-CM | POA: Diagnosis not present

## 2021-04-23 DIAGNOSIS — I10 Essential (primary) hypertension: Secondary | ICD-10-CM | POA: Diagnosis not present

## 2021-04-23 DIAGNOSIS — J810 Acute pulmonary edema: Secondary | ICD-10-CM | POA: Diagnosis not present

## 2021-04-23 DIAGNOSIS — J9612 Chronic respiratory failure with hypercapnia: Secondary | ICD-10-CM | POA: Diagnosis not present

## 2021-04-23 DIAGNOSIS — J9611 Chronic respiratory failure with hypoxia: Secondary | ICD-10-CM | POA: Diagnosis not present

## 2021-04-23 DIAGNOSIS — J9383 Other pneumothorax: Secondary | ICD-10-CM | POA: Diagnosis not present

## 2021-04-25 DIAGNOSIS — I10 Essential (primary) hypertension: Secondary | ICD-10-CM | POA: Diagnosis not present

## 2021-05-29 DIAGNOSIS — F17200 Nicotine dependence, unspecified, uncomplicated: Secondary | ICD-10-CM | POA: Diagnosis not present

## 2021-05-29 DIAGNOSIS — J9611 Chronic respiratory failure with hypoxia: Secondary | ICD-10-CM | POA: Diagnosis not present

## 2021-05-29 DIAGNOSIS — J449 Chronic obstructive pulmonary disease, unspecified: Secondary | ICD-10-CM | POA: Diagnosis not present

## 2021-05-29 DIAGNOSIS — J9612 Chronic respiratory failure with hypercapnia: Secondary | ICD-10-CM | POA: Diagnosis not present

## 2021-05-29 DIAGNOSIS — M549 Dorsalgia, unspecified: Secondary | ICD-10-CM | POA: Diagnosis not present

## 2021-05-29 DIAGNOSIS — I10 Essential (primary) hypertension: Secondary | ICD-10-CM | POA: Diagnosis not present

## 2021-05-29 DIAGNOSIS — G8929 Other chronic pain: Secondary | ICD-10-CM | POA: Diagnosis not present

## 2021-05-29 DIAGNOSIS — J810 Acute pulmonary edema: Secondary | ICD-10-CM | POA: Diagnosis not present

## 2021-05-29 DIAGNOSIS — Z7984 Long term (current) use of oral hypoglycemic drugs: Secondary | ICD-10-CM | POA: Diagnosis not present

## 2021-05-29 DIAGNOSIS — E785 Hyperlipidemia, unspecified: Secondary | ICD-10-CM | POA: Diagnosis not present

## 2021-05-29 DIAGNOSIS — K219 Gastro-esophageal reflux disease without esophagitis: Secondary | ICD-10-CM | POA: Diagnosis not present

## 2021-05-29 DIAGNOSIS — G4733 Obstructive sleep apnea (adult) (pediatric): Secondary | ICD-10-CM | POA: Diagnosis not present

## 2021-05-29 DIAGNOSIS — J9383 Other pneumothorax: Secondary | ICD-10-CM | POA: Diagnosis not present

## 2021-05-29 DIAGNOSIS — E119 Type 2 diabetes mellitus without complications: Secondary | ICD-10-CM | POA: Diagnosis not present

## 2021-06-02 DIAGNOSIS — E785 Hyperlipidemia, unspecified: Secondary | ICD-10-CM | POA: Diagnosis not present

## 2021-06-02 DIAGNOSIS — Z9981 Dependence on supplemental oxygen: Secondary | ICD-10-CM | POA: Diagnosis not present

## 2021-06-02 DIAGNOSIS — G4700 Insomnia, unspecified: Secondary | ICD-10-CM | POA: Diagnosis not present

## 2021-06-02 DIAGNOSIS — E119 Type 2 diabetes mellitus without complications: Secondary | ICD-10-CM | POA: Diagnosis not present

## 2021-06-02 DIAGNOSIS — J9611 Chronic respiratory failure with hypoxia: Secondary | ICD-10-CM | POA: Diagnosis not present

## 2021-06-02 DIAGNOSIS — J449 Chronic obstructive pulmonary disease, unspecified: Secondary | ICD-10-CM | POA: Diagnosis not present

## 2021-06-02 DIAGNOSIS — I110 Hypertensive heart disease with heart failure: Secondary | ICD-10-CM | POA: Diagnosis not present

## 2021-06-02 DIAGNOSIS — F17210 Nicotine dependence, cigarettes, uncomplicated: Secondary | ICD-10-CM | POA: Diagnosis not present

## 2021-06-02 DIAGNOSIS — G4733 Obstructive sleep apnea (adult) (pediatric): Secondary | ICD-10-CM | POA: Diagnosis not present

## 2021-06-02 DIAGNOSIS — K219 Gastro-esophageal reflux disease without esophagitis: Secondary | ICD-10-CM | POA: Diagnosis not present

## 2021-06-02 DIAGNOSIS — G8929 Other chronic pain: Secondary | ICD-10-CM | POA: Diagnosis not present

## 2021-06-02 DIAGNOSIS — M549 Dorsalgia, unspecified: Secondary | ICD-10-CM | POA: Diagnosis not present

## 2021-06-02 DIAGNOSIS — M797 Fibromyalgia: Secondary | ICD-10-CM | POA: Diagnosis not present

## 2021-06-02 DIAGNOSIS — Z7984 Long term (current) use of oral hypoglycemic drugs: Secondary | ICD-10-CM | POA: Diagnosis not present

## 2021-06-02 DIAGNOSIS — E039 Hypothyroidism, unspecified: Secondary | ICD-10-CM | POA: Diagnosis not present

## 2021-06-02 DIAGNOSIS — J9612 Chronic respiratory failure with hypercapnia: Secondary | ICD-10-CM | POA: Diagnosis not present

## 2021-06-02 DIAGNOSIS — I5030 Unspecified diastolic (congestive) heart failure: Secondary | ICD-10-CM | POA: Diagnosis not present

## 2021-06-06 DIAGNOSIS — J9612 Chronic respiratory failure with hypercapnia: Secondary | ICD-10-CM | POA: Diagnosis not present

## 2021-06-06 DIAGNOSIS — I110 Hypertensive heart disease with heart failure: Secondary | ICD-10-CM | POA: Diagnosis not present

## 2021-06-06 DIAGNOSIS — J9611 Chronic respiratory failure with hypoxia: Secondary | ICD-10-CM | POA: Diagnosis not present

## 2021-06-06 DIAGNOSIS — J449 Chronic obstructive pulmonary disease, unspecified: Secondary | ICD-10-CM | POA: Diagnosis not present

## 2021-06-08 DIAGNOSIS — J449 Chronic obstructive pulmonary disease, unspecified: Secondary | ICD-10-CM | POA: Diagnosis not present

## 2021-06-08 DIAGNOSIS — M5136 Other intervertebral disc degeneration, lumbar region: Secondary | ICD-10-CM | POA: Diagnosis not present

## 2021-06-08 DIAGNOSIS — R062 Wheezing: Secondary | ICD-10-CM | POA: Diagnosis not present

## 2021-06-08 DIAGNOSIS — M4800 Spinal stenosis, site unspecified: Secondary | ICD-10-CM | POA: Diagnosis not present

## 2021-06-09 DIAGNOSIS — Z7984 Long term (current) use of oral hypoglycemic drugs: Secondary | ICD-10-CM | POA: Diagnosis not present

## 2021-06-09 DIAGNOSIS — E119 Type 2 diabetes mellitus without complications: Secondary | ICD-10-CM | POA: Diagnosis not present

## 2021-06-09 DIAGNOSIS — M797 Fibromyalgia: Secondary | ICD-10-CM | POA: Diagnosis not present

## 2021-06-09 DIAGNOSIS — G8929 Other chronic pain: Secondary | ICD-10-CM | POA: Diagnosis not present

## 2021-06-09 DIAGNOSIS — I110 Hypertensive heart disease with heart failure: Secondary | ICD-10-CM | POA: Diagnosis not present

## 2021-06-09 DIAGNOSIS — J449 Chronic obstructive pulmonary disease, unspecified: Secondary | ICD-10-CM | POA: Diagnosis not present

## 2021-06-09 DIAGNOSIS — E039 Hypothyroidism, unspecified: Secondary | ICD-10-CM | POA: Diagnosis not present

## 2021-06-09 DIAGNOSIS — E785 Hyperlipidemia, unspecified: Secondary | ICD-10-CM | POA: Diagnosis not present

## 2021-06-09 DIAGNOSIS — M549 Dorsalgia, unspecified: Secondary | ICD-10-CM | POA: Diagnosis not present

## 2021-06-09 DIAGNOSIS — J9612 Chronic respiratory failure with hypercapnia: Secondary | ICD-10-CM | POA: Diagnosis not present

## 2021-06-09 DIAGNOSIS — Z9981 Dependence on supplemental oxygen: Secondary | ICD-10-CM | POA: Diagnosis not present

## 2021-06-09 DIAGNOSIS — I5030 Unspecified diastolic (congestive) heart failure: Secondary | ICD-10-CM | POA: Diagnosis not present

## 2021-06-09 DIAGNOSIS — G4700 Insomnia, unspecified: Secondary | ICD-10-CM | POA: Diagnosis not present

## 2021-06-09 DIAGNOSIS — F17210 Nicotine dependence, cigarettes, uncomplicated: Secondary | ICD-10-CM | POA: Diagnosis not present

## 2021-06-09 DIAGNOSIS — G4733 Obstructive sleep apnea (adult) (pediatric): Secondary | ICD-10-CM | POA: Diagnosis not present

## 2021-06-09 DIAGNOSIS — K219 Gastro-esophageal reflux disease without esophagitis: Secondary | ICD-10-CM | POA: Diagnosis not present

## 2021-06-09 DIAGNOSIS — J9611 Chronic respiratory failure with hypoxia: Secondary | ICD-10-CM | POA: Diagnosis not present

## 2021-06-10 DIAGNOSIS — G4700 Insomnia, unspecified: Secondary | ICD-10-CM | POA: Diagnosis not present

## 2021-06-10 DIAGNOSIS — J9612 Chronic respiratory failure with hypercapnia: Secondary | ICD-10-CM | POA: Diagnosis not present

## 2021-06-10 DIAGNOSIS — M797 Fibromyalgia: Secondary | ICD-10-CM | POA: Diagnosis not present

## 2021-06-10 DIAGNOSIS — E039 Hypothyroidism, unspecified: Secondary | ICD-10-CM | POA: Diagnosis not present

## 2021-06-10 DIAGNOSIS — I110 Hypertensive heart disease with heart failure: Secondary | ICD-10-CM | POA: Diagnosis not present

## 2021-06-10 DIAGNOSIS — G8929 Other chronic pain: Secondary | ICD-10-CM | POA: Diagnosis not present

## 2021-06-10 DIAGNOSIS — I5030 Unspecified diastolic (congestive) heart failure: Secondary | ICD-10-CM | POA: Diagnosis not present

## 2021-06-10 DIAGNOSIS — K219 Gastro-esophageal reflux disease without esophagitis: Secondary | ICD-10-CM | POA: Diagnosis not present

## 2021-06-10 DIAGNOSIS — Z7984 Long term (current) use of oral hypoglycemic drugs: Secondary | ICD-10-CM | POA: Diagnosis not present

## 2021-06-10 DIAGNOSIS — Z9981 Dependence on supplemental oxygen: Secondary | ICD-10-CM | POA: Diagnosis not present

## 2021-06-10 DIAGNOSIS — J449 Chronic obstructive pulmonary disease, unspecified: Secondary | ICD-10-CM | POA: Diagnosis not present

## 2021-06-10 DIAGNOSIS — M549 Dorsalgia, unspecified: Secondary | ICD-10-CM | POA: Diagnosis not present

## 2021-06-10 DIAGNOSIS — E119 Type 2 diabetes mellitus without complications: Secondary | ICD-10-CM | POA: Diagnosis not present

## 2021-06-10 DIAGNOSIS — F17210 Nicotine dependence, cigarettes, uncomplicated: Secondary | ICD-10-CM | POA: Diagnosis not present

## 2021-06-10 DIAGNOSIS — G4733 Obstructive sleep apnea (adult) (pediatric): Secondary | ICD-10-CM | POA: Diagnosis not present

## 2021-06-10 DIAGNOSIS — E785 Hyperlipidemia, unspecified: Secondary | ICD-10-CM | POA: Diagnosis not present

## 2021-06-10 DIAGNOSIS — J9611 Chronic respiratory failure with hypoxia: Secondary | ICD-10-CM | POA: Diagnosis not present

## 2021-06-11 DIAGNOSIS — M549 Dorsalgia, unspecified: Secondary | ICD-10-CM | POA: Diagnosis not present

## 2021-06-11 DIAGNOSIS — J449 Chronic obstructive pulmonary disease, unspecified: Secondary | ICD-10-CM | POA: Diagnosis not present

## 2021-06-11 DIAGNOSIS — I5030 Unspecified diastolic (congestive) heart failure: Secondary | ICD-10-CM | POA: Diagnosis not present

## 2021-06-11 DIAGNOSIS — I110 Hypertensive heart disease with heart failure: Secondary | ICD-10-CM | POA: Diagnosis not present

## 2021-06-11 DIAGNOSIS — E039 Hypothyroidism, unspecified: Secondary | ICD-10-CM | POA: Diagnosis not present

## 2021-06-11 DIAGNOSIS — G8929 Other chronic pain: Secondary | ICD-10-CM | POA: Diagnosis not present

## 2021-06-11 DIAGNOSIS — J9611 Chronic respiratory failure with hypoxia: Secondary | ICD-10-CM | POA: Diagnosis not present

## 2021-06-11 DIAGNOSIS — Z7984 Long term (current) use of oral hypoglycemic drugs: Secondary | ICD-10-CM | POA: Diagnosis not present

## 2021-06-11 DIAGNOSIS — K219 Gastro-esophageal reflux disease without esophagitis: Secondary | ICD-10-CM | POA: Diagnosis not present

## 2021-06-11 DIAGNOSIS — J9612 Chronic respiratory failure with hypercapnia: Secondary | ICD-10-CM | POA: Diagnosis not present

## 2021-06-11 DIAGNOSIS — F17210 Nicotine dependence, cigarettes, uncomplicated: Secondary | ICD-10-CM | POA: Diagnosis not present

## 2021-06-11 DIAGNOSIS — G4700 Insomnia, unspecified: Secondary | ICD-10-CM | POA: Diagnosis not present

## 2021-06-11 DIAGNOSIS — E785 Hyperlipidemia, unspecified: Secondary | ICD-10-CM | POA: Diagnosis not present

## 2021-06-11 DIAGNOSIS — G4733 Obstructive sleep apnea (adult) (pediatric): Secondary | ICD-10-CM | POA: Diagnosis not present

## 2021-06-11 DIAGNOSIS — M797 Fibromyalgia: Secondary | ICD-10-CM | POA: Diagnosis not present

## 2021-06-11 DIAGNOSIS — Z9981 Dependence on supplemental oxygen: Secondary | ICD-10-CM | POA: Diagnosis not present

## 2021-06-11 DIAGNOSIS — E119 Type 2 diabetes mellitus without complications: Secondary | ICD-10-CM | POA: Diagnosis not present

## 2021-06-18 DIAGNOSIS — J9612 Chronic respiratory failure with hypercapnia: Secondary | ICD-10-CM | POA: Diagnosis not present

## 2021-06-18 DIAGNOSIS — Z7984 Long term (current) use of oral hypoglycemic drugs: Secondary | ICD-10-CM | POA: Diagnosis not present

## 2021-06-18 DIAGNOSIS — G4700 Insomnia, unspecified: Secondary | ICD-10-CM | POA: Diagnosis not present

## 2021-06-18 DIAGNOSIS — Z9981 Dependence on supplemental oxygen: Secondary | ICD-10-CM | POA: Diagnosis not present

## 2021-06-18 DIAGNOSIS — J449 Chronic obstructive pulmonary disease, unspecified: Secondary | ICD-10-CM | POA: Diagnosis not present

## 2021-06-18 DIAGNOSIS — E039 Hypothyroidism, unspecified: Secondary | ICD-10-CM | POA: Diagnosis not present

## 2021-06-18 DIAGNOSIS — G4733 Obstructive sleep apnea (adult) (pediatric): Secondary | ICD-10-CM | POA: Diagnosis not present

## 2021-06-18 DIAGNOSIS — M797 Fibromyalgia: Secondary | ICD-10-CM | POA: Diagnosis not present

## 2021-06-18 DIAGNOSIS — J9611 Chronic respiratory failure with hypoxia: Secondary | ICD-10-CM | POA: Diagnosis not present

## 2021-06-18 DIAGNOSIS — G8929 Other chronic pain: Secondary | ICD-10-CM | POA: Diagnosis not present

## 2021-06-18 DIAGNOSIS — K219 Gastro-esophageal reflux disease without esophagitis: Secondary | ICD-10-CM | POA: Diagnosis not present

## 2021-06-18 DIAGNOSIS — F17210 Nicotine dependence, cigarettes, uncomplicated: Secondary | ICD-10-CM | POA: Diagnosis not present

## 2021-06-18 DIAGNOSIS — E785 Hyperlipidemia, unspecified: Secondary | ICD-10-CM | POA: Diagnosis not present

## 2021-06-18 DIAGNOSIS — E119 Type 2 diabetes mellitus without complications: Secondary | ICD-10-CM | POA: Diagnosis not present

## 2021-06-18 DIAGNOSIS — I110 Hypertensive heart disease with heart failure: Secondary | ICD-10-CM | POA: Diagnosis not present

## 2021-06-18 DIAGNOSIS — M549 Dorsalgia, unspecified: Secondary | ICD-10-CM | POA: Diagnosis not present

## 2021-06-18 DIAGNOSIS — I5030 Unspecified diastolic (congestive) heart failure: Secondary | ICD-10-CM | POA: Diagnosis not present

## 2021-06-24 DIAGNOSIS — Z7984 Long term (current) use of oral hypoglycemic drugs: Secondary | ICD-10-CM | POA: Diagnosis not present

## 2021-06-24 DIAGNOSIS — G4700 Insomnia, unspecified: Secondary | ICD-10-CM | POA: Diagnosis not present

## 2021-06-24 DIAGNOSIS — Z9981 Dependence on supplemental oxygen: Secondary | ICD-10-CM | POA: Diagnosis not present

## 2021-06-24 DIAGNOSIS — E785 Hyperlipidemia, unspecified: Secondary | ICD-10-CM | POA: Diagnosis not present

## 2021-06-24 DIAGNOSIS — E039 Hypothyroidism, unspecified: Secondary | ICD-10-CM | POA: Diagnosis not present

## 2021-06-24 DIAGNOSIS — M549 Dorsalgia, unspecified: Secondary | ICD-10-CM | POA: Diagnosis not present

## 2021-06-24 DIAGNOSIS — M797 Fibromyalgia: Secondary | ICD-10-CM | POA: Diagnosis not present

## 2021-06-24 DIAGNOSIS — I5030 Unspecified diastolic (congestive) heart failure: Secondary | ICD-10-CM | POA: Diagnosis not present

## 2021-06-24 DIAGNOSIS — K219 Gastro-esophageal reflux disease without esophagitis: Secondary | ICD-10-CM | POA: Diagnosis not present

## 2021-06-24 DIAGNOSIS — F17210 Nicotine dependence, cigarettes, uncomplicated: Secondary | ICD-10-CM | POA: Diagnosis not present

## 2021-06-24 DIAGNOSIS — J9611 Chronic respiratory failure with hypoxia: Secondary | ICD-10-CM | POA: Diagnosis not present

## 2021-06-24 DIAGNOSIS — I110 Hypertensive heart disease with heart failure: Secondary | ICD-10-CM | POA: Diagnosis not present

## 2021-06-24 DIAGNOSIS — J9612 Chronic respiratory failure with hypercapnia: Secondary | ICD-10-CM | POA: Diagnosis not present

## 2021-06-24 DIAGNOSIS — G4733 Obstructive sleep apnea (adult) (pediatric): Secondary | ICD-10-CM | POA: Diagnosis not present

## 2021-06-24 DIAGNOSIS — G8929 Other chronic pain: Secondary | ICD-10-CM | POA: Diagnosis not present

## 2021-06-24 DIAGNOSIS — E119 Type 2 diabetes mellitus without complications: Secondary | ICD-10-CM | POA: Diagnosis not present

## 2021-06-24 DIAGNOSIS — J449 Chronic obstructive pulmonary disease, unspecified: Secondary | ICD-10-CM | POA: Diagnosis not present

## 2021-06-25 DIAGNOSIS — J449 Chronic obstructive pulmonary disease, unspecified: Secondary | ICD-10-CM | POA: Diagnosis not present

## 2021-06-25 DIAGNOSIS — J9611 Chronic respiratory failure with hypoxia: Secondary | ICD-10-CM | POA: Diagnosis not present

## 2021-06-25 DIAGNOSIS — I110 Hypertensive heart disease with heart failure: Secondary | ICD-10-CM | POA: Diagnosis not present

## 2021-06-25 DIAGNOSIS — J9612 Chronic respiratory failure with hypercapnia: Secondary | ICD-10-CM | POA: Diagnosis not present

## 2021-06-27 DIAGNOSIS — I10 Essential (primary) hypertension: Secondary | ICD-10-CM | POA: Diagnosis not present

## 2021-06-30 DIAGNOSIS — K219 Gastro-esophageal reflux disease without esophagitis: Secondary | ICD-10-CM | POA: Diagnosis not present

## 2021-06-30 DIAGNOSIS — G4700 Insomnia, unspecified: Secondary | ICD-10-CM | POA: Diagnosis not present

## 2021-06-30 DIAGNOSIS — F17210 Nicotine dependence, cigarettes, uncomplicated: Secondary | ICD-10-CM | POA: Diagnosis not present

## 2021-06-30 DIAGNOSIS — I110 Hypertensive heart disease with heart failure: Secondary | ICD-10-CM | POA: Diagnosis not present

## 2021-06-30 DIAGNOSIS — J9612 Chronic respiratory failure with hypercapnia: Secondary | ICD-10-CM | POA: Diagnosis not present

## 2021-06-30 DIAGNOSIS — M797 Fibromyalgia: Secondary | ICD-10-CM | POA: Diagnosis not present

## 2021-06-30 DIAGNOSIS — Z9981 Dependence on supplemental oxygen: Secondary | ICD-10-CM | POA: Diagnosis not present

## 2021-06-30 DIAGNOSIS — G8929 Other chronic pain: Secondary | ICD-10-CM | POA: Diagnosis not present

## 2021-06-30 DIAGNOSIS — G4733 Obstructive sleep apnea (adult) (pediatric): Secondary | ICD-10-CM | POA: Diagnosis not present

## 2021-06-30 DIAGNOSIS — J9611 Chronic respiratory failure with hypoxia: Secondary | ICD-10-CM | POA: Diagnosis not present

## 2021-06-30 DIAGNOSIS — J449 Chronic obstructive pulmonary disease, unspecified: Secondary | ICD-10-CM | POA: Diagnosis not present

## 2021-06-30 DIAGNOSIS — M549 Dorsalgia, unspecified: Secondary | ICD-10-CM | POA: Diagnosis not present

## 2021-06-30 DIAGNOSIS — I5030 Unspecified diastolic (congestive) heart failure: Secondary | ICD-10-CM | POA: Diagnosis not present

## 2021-06-30 DIAGNOSIS — E119 Type 2 diabetes mellitus without complications: Secondary | ICD-10-CM | POA: Diagnosis not present

## 2021-06-30 DIAGNOSIS — E039 Hypothyroidism, unspecified: Secondary | ICD-10-CM | POA: Diagnosis not present

## 2021-06-30 DIAGNOSIS — E785 Hyperlipidemia, unspecified: Secondary | ICD-10-CM | POA: Diagnosis not present

## 2021-06-30 DIAGNOSIS — Z7984 Long term (current) use of oral hypoglycemic drugs: Secondary | ICD-10-CM | POA: Diagnosis not present

## 2021-07-02 DIAGNOSIS — G8929 Other chronic pain: Secondary | ICD-10-CM | POA: Diagnosis not present

## 2021-07-02 DIAGNOSIS — I5030 Unspecified diastolic (congestive) heart failure: Secondary | ICD-10-CM | POA: Diagnosis not present

## 2021-07-02 DIAGNOSIS — Z7984 Long term (current) use of oral hypoglycemic drugs: Secondary | ICD-10-CM | POA: Diagnosis not present

## 2021-07-02 DIAGNOSIS — E785 Hyperlipidemia, unspecified: Secondary | ICD-10-CM | POA: Diagnosis not present

## 2021-07-02 DIAGNOSIS — K219 Gastro-esophageal reflux disease without esophagitis: Secondary | ICD-10-CM | POA: Diagnosis not present

## 2021-07-02 DIAGNOSIS — J9611 Chronic respiratory failure with hypoxia: Secondary | ICD-10-CM | POA: Diagnosis not present

## 2021-07-02 DIAGNOSIS — J449 Chronic obstructive pulmonary disease, unspecified: Secondary | ICD-10-CM | POA: Diagnosis not present

## 2021-07-02 DIAGNOSIS — J9612 Chronic respiratory failure with hypercapnia: Secondary | ICD-10-CM | POA: Diagnosis not present

## 2021-07-02 DIAGNOSIS — M549 Dorsalgia, unspecified: Secondary | ICD-10-CM | POA: Diagnosis not present

## 2021-07-02 DIAGNOSIS — M797 Fibromyalgia: Secondary | ICD-10-CM | POA: Diagnosis not present

## 2021-07-02 DIAGNOSIS — I110 Hypertensive heart disease with heart failure: Secondary | ICD-10-CM | POA: Diagnosis not present

## 2021-07-02 DIAGNOSIS — G4700 Insomnia, unspecified: Secondary | ICD-10-CM | POA: Diagnosis not present

## 2021-07-02 DIAGNOSIS — F17210 Nicotine dependence, cigarettes, uncomplicated: Secondary | ICD-10-CM | POA: Diagnosis not present

## 2021-07-02 DIAGNOSIS — G4733 Obstructive sleep apnea (adult) (pediatric): Secondary | ICD-10-CM | POA: Diagnosis not present

## 2021-07-02 DIAGNOSIS — E119 Type 2 diabetes mellitus without complications: Secondary | ICD-10-CM | POA: Diagnosis not present

## 2021-07-02 DIAGNOSIS — E039 Hypothyroidism, unspecified: Secondary | ICD-10-CM | POA: Diagnosis not present

## 2021-07-02 DIAGNOSIS — Z9981 Dependence on supplemental oxygen: Secondary | ICD-10-CM | POA: Diagnosis not present

## 2021-07-08 DIAGNOSIS — M5136 Other intervertebral disc degeneration, lumbar region: Secondary | ICD-10-CM | POA: Diagnosis not present

## 2021-07-08 DIAGNOSIS — J449 Chronic obstructive pulmonary disease, unspecified: Secondary | ICD-10-CM | POA: Diagnosis not present

## 2021-07-08 DIAGNOSIS — R062 Wheezing: Secondary | ICD-10-CM | POA: Diagnosis not present

## 2021-07-08 DIAGNOSIS — M4800 Spinal stenosis, site unspecified: Secondary | ICD-10-CM | POA: Diagnosis not present

## 2021-07-09 DIAGNOSIS — G8929 Other chronic pain: Secondary | ICD-10-CM | POA: Diagnosis not present

## 2021-07-09 DIAGNOSIS — Z7984 Long term (current) use of oral hypoglycemic drugs: Secondary | ICD-10-CM | POA: Diagnosis not present

## 2021-07-09 DIAGNOSIS — I110 Hypertensive heart disease with heart failure: Secondary | ICD-10-CM | POA: Diagnosis not present

## 2021-07-09 DIAGNOSIS — G4733 Obstructive sleep apnea (adult) (pediatric): Secondary | ICD-10-CM | POA: Diagnosis not present

## 2021-07-09 DIAGNOSIS — J9611 Chronic respiratory failure with hypoxia: Secondary | ICD-10-CM | POA: Diagnosis not present

## 2021-07-09 DIAGNOSIS — G4700 Insomnia, unspecified: Secondary | ICD-10-CM | POA: Diagnosis not present

## 2021-07-09 DIAGNOSIS — E119 Type 2 diabetes mellitus without complications: Secondary | ICD-10-CM | POA: Diagnosis not present

## 2021-07-09 DIAGNOSIS — I5030 Unspecified diastolic (congestive) heart failure: Secondary | ICD-10-CM | POA: Diagnosis not present

## 2021-07-09 DIAGNOSIS — E785 Hyperlipidemia, unspecified: Secondary | ICD-10-CM | POA: Diagnosis not present

## 2021-07-09 DIAGNOSIS — M549 Dorsalgia, unspecified: Secondary | ICD-10-CM | POA: Diagnosis not present

## 2021-07-09 DIAGNOSIS — E039 Hypothyroidism, unspecified: Secondary | ICD-10-CM | POA: Diagnosis not present

## 2021-07-09 DIAGNOSIS — K219 Gastro-esophageal reflux disease without esophagitis: Secondary | ICD-10-CM | POA: Diagnosis not present

## 2021-07-09 DIAGNOSIS — Z9981 Dependence on supplemental oxygen: Secondary | ICD-10-CM | POA: Diagnosis not present

## 2021-07-09 DIAGNOSIS — J9612 Chronic respiratory failure with hypercapnia: Secondary | ICD-10-CM | POA: Diagnosis not present

## 2021-07-09 DIAGNOSIS — J449 Chronic obstructive pulmonary disease, unspecified: Secondary | ICD-10-CM | POA: Diagnosis not present

## 2021-07-09 DIAGNOSIS — M797 Fibromyalgia: Secondary | ICD-10-CM | POA: Diagnosis not present

## 2021-07-09 DIAGNOSIS — F17210 Nicotine dependence, cigarettes, uncomplicated: Secondary | ICD-10-CM | POA: Diagnosis not present

## 2021-07-11 DIAGNOSIS — M797 Fibromyalgia: Secondary | ICD-10-CM | POA: Diagnosis not present

## 2021-07-11 DIAGNOSIS — Z7984 Long term (current) use of oral hypoglycemic drugs: Secondary | ICD-10-CM | POA: Diagnosis not present

## 2021-07-11 DIAGNOSIS — F17210 Nicotine dependence, cigarettes, uncomplicated: Secondary | ICD-10-CM | POA: Diagnosis not present

## 2021-07-11 DIAGNOSIS — Z9981 Dependence on supplemental oxygen: Secondary | ICD-10-CM | POA: Diagnosis not present

## 2021-07-11 DIAGNOSIS — J449 Chronic obstructive pulmonary disease, unspecified: Secondary | ICD-10-CM | POA: Diagnosis not present

## 2021-07-11 DIAGNOSIS — M549 Dorsalgia, unspecified: Secondary | ICD-10-CM | POA: Diagnosis not present

## 2021-07-11 DIAGNOSIS — J9612 Chronic respiratory failure with hypercapnia: Secondary | ICD-10-CM | POA: Diagnosis not present

## 2021-07-11 DIAGNOSIS — J9611 Chronic respiratory failure with hypoxia: Secondary | ICD-10-CM | POA: Diagnosis not present

## 2021-07-11 DIAGNOSIS — K219 Gastro-esophageal reflux disease without esophagitis: Secondary | ICD-10-CM | POA: Diagnosis not present

## 2021-07-11 DIAGNOSIS — E119 Type 2 diabetes mellitus without complications: Secondary | ICD-10-CM | POA: Diagnosis not present

## 2021-07-11 DIAGNOSIS — E785 Hyperlipidemia, unspecified: Secondary | ICD-10-CM | POA: Diagnosis not present

## 2021-07-11 DIAGNOSIS — I110 Hypertensive heart disease with heart failure: Secondary | ICD-10-CM | POA: Diagnosis not present

## 2021-07-11 DIAGNOSIS — G8929 Other chronic pain: Secondary | ICD-10-CM | POA: Diagnosis not present

## 2021-07-11 DIAGNOSIS — G4700 Insomnia, unspecified: Secondary | ICD-10-CM | POA: Diagnosis not present

## 2021-07-11 DIAGNOSIS — G4733 Obstructive sleep apnea (adult) (pediatric): Secondary | ICD-10-CM | POA: Diagnosis not present

## 2021-07-11 DIAGNOSIS — E039 Hypothyroidism, unspecified: Secondary | ICD-10-CM | POA: Diagnosis not present

## 2021-07-11 DIAGNOSIS — I5030 Unspecified diastolic (congestive) heart failure: Secondary | ICD-10-CM | POA: Diagnosis not present

## 2021-07-22 DIAGNOSIS — G8929 Other chronic pain: Secondary | ICD-10-CM | POA: Diagnosis not present

## 2021-07-22 DIAGNOSIS — J449 Chronic obstructive pulmonary disease, unspecified: Secondary | ICD-10-CM | POA: Diagnosis not present

## 2021-07-22 DIAGNOSIS — E785 Hyperlipidemia, unspecified: Secondary | ICD-10-CM | POA: Diagnosis not present

## 2021-07-22 DIAGNOSIS — Z7984 Long term (current) use of oral hypoglycemic drugs: Secondary | ICD-10-CM | POA: Diagnosis not present

## 2021-07-22 DIAGNOSIS — I5030 Unspecified diastolic (congestive) heart failure: Secondary | ICD-10-CM | POA: Diagnosis not present

## 2021-07-22 DIAGNOSIS — G4733 Obstructive sleep apnea (adult) (pediatric): Secondary | ICD-10-CM | POA: Diagnosis not present

## 2021-07-22 DIAGNOSIS — I110 Hypertensive heart disease with heart failure: Secondary | ICD-10-CM | POA: Diagnosis not present

## 2021-07-22 DIAGNOSIS — M797 Fibromyalgia: Secondary | ICD-10-CM | POA: Diagnosis not present

## 2021-07-22 DIAGNOSIS — G4700 Insomnia, unspecified: Secondary | ICD-10-CM | POA: Diagnosis not present

## 2021-07-22 DIAGNOSIS — E119 Type 2 diabetes mellitus without complications: Secondary | ICD-10-CM | POA: Diagnosis not present

## 2021-07-22 DIAGNOSIS — J9611 Chronic respiratory failure with hypoxia: Secondary | ICD-10-CM | POA: Diagnosis not present

## 2021-07-22 DIAGNOSIS — E039 Hypothyroidism, unspecified: Secondary | ICD-10-CM | POA: Diagnosis not present

## 2021-07-22 DIAGNOSIS — Z9981 Dependence on supplemental oxygen: Secondary | ICD-10-CM | POA: Diagnosis not present

## 2021-07-22 DIAGNOSIS — K219 Gastro-esophageal reflux disease without esophagitis: Secondary | ICD-10-CM | POA: Diagnosis not present

## 2021-07-22 DIAGNOSIS — F17210 Nicotine dependence, cigarettes, uncomplicated: Secondary | ICD-10-CM | POA: Diagnosis not present

## 2021-07-22 DIAGNOSIS — M549 Dorsalgia, unspecified: Secondary | ICD-10-CM | POA: Diagnosis not present

## 2021-07-22 DIAGNOSIS — J9612 Chronic respiratory failure with hypercapnia: Secondary | ICD-10-CM | POA: Diagnosis not present

## 2021-07-25 DIAGNOSIS — G4733 Obstructive sleep apnea (adult) (pediatric): Secondary | ICD-10-CM | POA: Diagnosis not present

## 2021-07-25 DIAGNOSIS — K219 Gastro-esophageal reflux disease without esophagitis: Secondary | ICD-10-CM | POA: Diagnosis not present

## 2021-07-25 DIAGNOSIS — J449 Chronic obstructive pulmonary disease, unspecified: Secondary | ICD-10-CM | POA: Diagnosis not present

## 2021-07-25 DIAGNOSIS — F17210 Nicotine dependence, cigarettes, uncomplicated: Secondary | ICD-10-CM | POA: Diagnosis not present

## 2021-07-25 DIAGNOSIS — E1165 Type 2 diabetes mellitus with hyperglycemia: Secondary | ICD-10-CM | POA: Diagnosis not present

## 2021-07-25 DIAGNOSIS — Z299 Encounter for prophylactic measures, unspecified: Secondary | ICD-10-CM | POA: Diagnosis not present

## 2021-07-25 DIAGNOSIS — I10 Essential (primary) hypertension: Secondary | ICD-10-CM | POA: Diagnosis not present

## 2021-07-28 DIAGNOSIS — I10 Essential (primary) hypertension: Secondary | ICD-10-CM | POA: Diagnosis not present

## 2021-07-29 DIAGNOSIS — Z7984 Long term (current) use of oral hypoglycemic drugs: Secondary | ICD-10-CM | POA: Diagnosis not present

## 2021-07-29 DIAGNOSIS — G4700 Insomnia, unspecified: Secondary | ICD-10-CM | POA: Diagnosis not present

## 2021-07-29 DIAGNOSIS — E039 Hypothyroidism, unspecified: Secondary | ICD-10-CM | POA: Diagnosis not present

## 2021-07-29 DIAGNOSIS — E785 Hyperlipidemia, unspecified: Secondary | ICD-10-CM | POA: Diagnosis not present

## 2021-07-29 DIAGNOSIS — Z9981 Dependence on supplemental oxygen: Secondary | ICD-10-CM | POA: Diagnosis not present

## 2021-07-29 DIAGNOSIS — J9612 Chronic respiratory failure with hypercapnia: Secondary | ICD-10-CM | POA: Diagnosis not present

## 2021-07-29 DIAGNOSIS — I110 Hypertensive heart disease with heart failure: Secondary | ICD-10-CM | POA: Diagnosis not present

## 2021-07-29 DIAGNOSIS — G4733 Obstructive sleep apnea (adult) (pediatric): Secondary | ICD-10-CM | POA: Diagnosis not present

## 2021-07-29 DIAGNOSIS — F17210 Nicotine dependence, cigarettes, uncomplicated: Secondary | ICD-10-CM | POA: Diagnosis not present

## 2021-07-29 DIAGNOSIS — K219 Gastro-esophageal reflux disease without esophagitis: Secondary | ICD-10-CM | POA: Diagnosis not present

## 2021-07-29 DIAGNOSIS — M797 Fibromyalgia: Secondary | ICD-10-CM | POA: Diagnosis not present

## 2021-07-29 DIAGNOSIS — I5030 Unspecified diastolic (congestive) heart failure: Secondary | ICD-10-CM | POA: Diagnosis not present

## 2021-07-29 DIAGNOSIS — J449 Chronic obstructive pulmonary disease, unspecified: Secondary | ICD-10-CM | POA: Diagnosis not present

## 2021-07-29 DIAGNOSIS — E119 Type 2 diabetes mellitus without complications: Secondary | ICD-10-CM | POA: Diagnosis not present

## 2021-07-29 DIAGNOSIS — G8929 Other chronic pain: Secondary | ICD-10-CM | POA: Diagnosis not present

## 2021-07-29 DIAGNOSIS — J9611 Chronic respiratory failure with hypoxia: Secondary | ICD-10-CM | POA: Diagnosis not present

## 2021-07-29 DIAGNOSIS — M549 Dorsalgia, unspecified: Secondary | ICD-10-CM | POA: Diagnosis not present

## 2021-07-30 DIAGNOSIS — Z9981 Dependence on supplemental oxygen: Secondary | ICD-10-CM | POA: Diagnosis not present

## 2021-07-30 DIAGNOSIS — I110 Hypertensive heart disease with heart failure: Secondary | ICD-10-CM | POA: Diagnosis not present

## 2021-07-30 DIAGNOSIS — E119 Type 2 diabetes mellitus without complications: Secondary | ICD-10-CM | POA: Diagnosis not present

## 2021-07-30 DIAGNOSIS — J9611 Chronic respiratory failure with hypoxia: Secondary | ICD-10-CM | POA: Diagnosis not present

## 2021-07-30 DIAGNOSIS — E785 Hyperlipidemia, unspecified: Secondary | ICD-10-CM | POA: Diagnosis not present

## 2021-07-30 DIAGNOSIS — M549 Dorsalgia, unspecified: Secondary | ICD-10-CM | POA: Diagnosis not present

## 2021-07-30 DIAGNOSIS — F17210 Nicotine dependence, cigarettes, uncomplicated: Secondary | ICD-10-CM | POA: Diagnosis not present

## 2021-07-30 DIAGNOSIS — G4733 Obstructive sleep apnea (adult) (pediatric): Secondary | ICD-10-CM | POA: Diagnosis not present

## 2021-07-30 DIAGNOSIS — E039 Hypothyroidism, unspecified: Secondary | ICD-10-CM | POA: Diagnosis not present

## 2021-07-30 DIAGNOSIS — Z7984 Long term (current) use of oral hypoglycemic drugs: Secondary | ICD-10-CM | POA: Diagnosis not present

## 2021-07-30 DIAGNOSIS — J449 Chronic obstructive pulmonary disease, unspecified: Secondary | ICD-10-CM | POA: Diagnosis not present

## 2021-07-30 DIAGNOSIS — G8929 Other chronic pain: Secondary | ICD-10-CM | POA: Diagnosis not present

## 2021-07-30 DIAGNOSIS — I5030 Unspecified diastolic (congestive) heart failure: Secondary | ICD-10-CM | POA: Diagnosis not present

## 2021-07-30 DIAGNOSIS — K219 Gastro-esophageal reflux disease without esophagitis: Secondary | ICD-10-CM | POA: Diagnosis not present

## 2021-07-30 DIAGNOSIS — J9612 Chronic respiratory failure with hypercapnia: Secondary | ICD-10-CM | POA: Diagnosis not present

## 2021-07-30 DIAGNOSIS — M797 Fibromyalgia: Secondary | ICD-10-CM | POA: Diagnosis not present

## 2021-07-30 DIAGNOSIS — G4700 Insomnia, unspecified: Secondary | ICD-10-CM | POA: Diagnosis not present

## 2021-07-31 DIAGNOSIS — I110 Hypertensive heart disease with heart failure: Secondary | ICD-10-CM | POA: Diagnosis not present

## 2021-07-31 DIAGNOSIS — Z9981 Dependence on supplemental oxygen: Secondary | ICD-10-CM | POA: Diagnosis not present

## 2021-07-31 DIAGNOSIS — Z7984 Long term (current) use of oral hypoglycemic drugs: Secondary | ICD-10-CM | POA: Diagnosis not present

## 2021-07-31 DIAGNOSIS — E785 Hyperlipidemia, unspecified: Secondary | ICD-10-CM | POA: Diagnosis not present

## 2021-07-31 DIAGNOSIS — J449 Chronic obstructive pulmonary disease, unspecified: Secondary | ICD-10-CM | POA: Diagnosis not present

## 2021-07-31 DIAGNOSIS — G8929 Other chronic pain: Secondary | ICD-10-CM | POA: Diagnosis not present

## 2021-07-31 DIAGNOSIS — I5030 Unspecified diastolic (congestive) heart failure: Secondary | ICD-10-CM | POA: Diagnosis not present

## 2021-07-31 DIAGNOSIS — G4700 Insomnia, unspecified: Secondary | ICD-10-CM | POA: Diagnosis not present

## 2021-07-31 DIAGNOSIS — E119 Type 2 diabetes mellitus without complications: Secondary | ICD-10-CM | POA: Diagnosis not present

## 2021-07-31 DIAGNOSIS — K219 Gastro-esophageal reflux disease without esophagitis: Secondary | ICD-10-CM | POA: Diagnosis not present

## 2021-07-31 DIAGNOSIS — F17210 Nicotine dependence, cigarettes, uncomplicated: Secondary | ICD-10-CM | POA: Diagnosis not present

## 2021-07-31 DIAGNOSIS — J9612 Chronic respiratory failure with hypercapnia: Secondary | ICD-10-CM | POA: Diagnosis not present

## 2021-07-31 DIAGNOSIS — M549 Dorsalgia, unspecified: Secondary | ICD-10-CM | POA: Diagnosis not present

## 2021-07-31 DIAGNOSIS — J9611 Chronic respiratory failure with hypoxia: Secondary | ICD-10-CM | POA: Diagnosis not present

## 2021-07-31 DIAGNOSIS — E039 Hypothyroidism, unspecified: Secondary | ICD-10-CM | POA: Diagnosis not present

## 2021-07-31 DIAGNOSIS — M797 Fibromyalgia: Secondary | ICD-10-CM | POA: Diagnosis not present

## 2021-07-31 DIAGNOSIS — G4733 Obstructive sleep apnea (adult) (pediatric): Secondary | ICD-10-CM | POA: Diagnosis not present

## 2021-08-01 DIAGNOSIS — J9611 Chronic respiratory failure with hypoxia: Secondary | ICD-10-CM | POA: Diagnosis not present

## 2021-08-01 DIAGNOSIS — J449 Chronic obstructive pulmonary disease, unspecified: Secondary | ICD-10-CM | POA: Diagnosis not present

## 2021-08-01 DIAGNOSIS — J9612 Chronic respiratory failure with hypercapnia: Secondary | ICD-10-CM | POA: Diagnosis not present

## 2021-08-01 DIAGNOSIS — I110 Hypertensive heart disease with heart failure: Secondary | ICD-10-CM | POA: Diagnosis not present

## 2021-08-07 DIAGNOSIS — G4700 Insomnia, unspecified: Secondary | ICD-10-CM | POA: Diagnosis not present

## 2021-08-07 DIAGNOSIS — E039 Hypothyroidism, unspecified: Secondary | ICD-10-CM | POA: Diagnosis not present

## 2021-08-07 DIAGNOSIS — E785 Hyperlipidemia, unspecified: Secondary | ICD-10-CM | POA: Diagnosis not present

## 2021-08-07 DIAGNOSIS — K219 Gastro-esophageal reflux disease without esophagitis: Secondary | ICD-10-CM | POA: Diagnosis not present

## 2021-08-07 DIAGNOSIS — J9611 Chronic respiratory failure with hypoxia: Secondary | ICD-10-CM | POA: Diagnosis not present

## 2021-08-07 DIAGNOSIS — F17210 Nicotine dependence, cigarettes, uncomplicated: Secondary | ICD-10-CM | POA: Diagnosis not present

## 2021-08-07 DIAGNOSIS — M797 Fibromyalgia: Secondary | ICD-10-CM | POA: Diagnosis not present

## 2021-08-07 DIAGNOSIS — I5030 Unspecified diastolic (congestive) heart failure: Secondary | ICD-10-CM | POA: Diagnosis not present

## 2021-08-07 DIAGNOSIS — E119 Type 2 diabetes mellitus without complications: Secondary | ICD-10-CM | POA: Diagnosis not present

## 2021-08-07 DIAGNOSIS — Z7984 Long term (current) use of oral hypoglycemic drugs: Secondary | ICD-10-CM | POA: Diagnosis not present

## 2021-08-07 DIAGNOSIS — G8929 Other chronic pain: Secondary | ICD-10-CM | POA: Diagnosis not present

## 2021-08-07 DIAGNOSIS — J449 Chronic obstructive pulmonary disease, unspecified: Secondary | ICD-10-CM | POA: Diagnosis not present

## 2021-08-07 DIAGNOSIS — Z9981 Dependence on supplemental oxygen: Secondary | ICD-10-CM | POA: Diagnosis not present

## 2021-08-07 DIAGNOSIS — M549 Dorsalgia, unspecified: Secondary | ICD-10-CM | POA: Diagnosis not present

## 2021-08-07 DIAGNOSIS — I110 Hypertensive heart disease with heart failure: Secondary | ICD-10-CM | POA: Diagnosis not present

## 2021-08-07 DIAGNOSIS — J9612 Chronic respiratory failure with hypercapnia: Secondary | ICD-10-CM | POA: Diagnosis not present

## 2021-08-07 DIAGNOSIS — G4733 Obstructive sleep apnea (adult) (pediatric): Secondary | ICD-10-CM | POA: Diagnosis not present

## 2021-08-08 DIAGNOSIS — M5136 Other intervertebral disc degeneration, lumbar region: Secondary | ICD-10-CM | POA: Diagnosis not present

## 2021-08-08 DIAGNOSIS — M4800 Spinal stenosis, site unspecified: Secondary | ICD-10-CM | POA: Diagnosis not present

## 2021-08-08 DIAGNOSIS — R062 Wheezing: Secondary | ICD-10-CM | POA: Diagnosis not present

## 2021-08-08 DIAGNOSIS — J449 Chronic obstructive pulmonary disease, unspecified: Secondary | ICD-10-CM | POA: Diagnosis not present

## 2021-08-12 DIAGNOSIS — Z9981 Dependence on supplemental oxygen: Secondary | ICD-10-CM | POA: Diagnosis not present

## 2021-08-12 DIAGNOSIS — G8929 Other chronic pain: Secondary | ICD-10-CM | POA: Diagnosis not present

## 2021-08-12 DIAGNOSIS — J9611 Chronic respiratory failure with hypoxia: Secondary | ICD-10-CM | POA: Diagnosis not present

## 2021-08-12 DIAGNOSIS — G4733 Obstructive sleep apnea (adult) (pediatric): Secondary | ICD-10-CM | POA: Diagnosis not present

## 2021-08-12 DIAGNOSIS — Z7984 Long term (current) use of oral hypoglycemic drugs: Secondary | ICD-10-CM | POA: Diagnosis not present

## 2021-08-12 DIAGNOSIS — G4700 Insomnia, unspecified: Secondary | ICD-10-CM | POA: Diagnosis not present

## 2021-08-12 DIAGNOSIS — E039 Hypothyroidism, unspecified: Secondary | ICD-10-CM | POA: Diagnosis not present

## 2021-08-12 DIAGNOSIS — I110 Hypertensive heart disease with heart failure: Secondary | ICD-10-CM | POA: Diagnosis not present

## 2021-08-12 DIAGNOSIS — J449 Chronic obstructive pulmonary disease, unspecified: Secondary | ICD-10-CM | POA: Diagnosis not present

## 2021-08-12 DIAGNOSIS — J9612 Chronic respiratory failure with hypercapnia: Secondary | ICD-10-CM | POA: Diagnosis not present

## 2021-08-12 DIAGNOSIS — M549 Dorsalgia, unspecified: Secondary | ICD-10-CM | POA: Diagnosis not present

## 2021-08-12 DIAGNOSIS — I5030 Unspecified diastolic (congestive) heart failure: Secondary | ICD-10-CM | POA: Diagnosis not present

## 2021-08-12 DIAGNOSIS — E119 Type 2 diabetes mellitus without complications: Secondary | ICD-10-CM | POA: Diagnosis not present

## 2021-08-12 DIAGNOSIS — K219 Gastro-esophageal reflux disease without esophagitis: Secondary | ICD-10-CM | POA: Diagnosis not present

## 2021-08-12 DIAGNOSIS — E785 Hyperlipidemia, unspecified: Secondary | ICD-10-CM | POA: Diagnosis not present

## 2021-08-12 DIAGNOSIS — F17210 Nicotine dependence, cigarettes, uncomplicated: Secondary | ICD-10-CM | POA: Diagnosis not present

## 2021-08-12 DIAGNOSIS — M797 Fibromyalgia: Secondary | ICD-10-CM | POA: Diagnosis not present

## 2021-08-19 DIAGNOSIS — I110 Hypertensive heart disease with heart failure: Secondary | ICD-10-CM | POA: Diagnosis not present

## 2021-08-19 DIAGNOSIS — J9612 Chronic respiratory failure with hypercapnia: Secondary | ICD-10-CM | POA: Diagnosis not present

## 2021-08-19 DIAGNOSIS — G4733 Obstructive sleep apnea (adult) (pediatric): Secondary | ICD-10-CM | POA: Diagnosis not present

## 2021-08-19 DIAGNOSIS — E785 Hyperlipidemia, unspecified: Secondary | ICD-10-CM | POA: Diagnosis not present

## 2021-08-19 DIAGNOSIS — I5030 Unspecified diastolic (congestive) heart failure: Secondary | ICD-10-CM | POA: Diagnosis not present

## 2021-08-19 DIAGNOSIS — Z7984 Long term (current) use of oral hypoglycemic drugs: Secondary | ICD-10-CM | POA: Diagnosis not present

## 2021-08-19 DIAGNOSIS — E039 Hypothyroidism, unspecified: Secondary | ICD-10-CM | POA: Diagnosis not present

## 2021-08-19 DIAGNOSIS — E119 Type 2 diabetes mellitus without complications: Secondary | ICD-10-CM | POA: Diagnosis not present

## 2021-08-19 DIAGNOSIS — Z9981 Dependence on supplemental oxygen: Secondary | ICD-10-CM | POA: Diagnosis not present

## 2021-08-19 DIAGNOSIS — M797 Fibromyalgia: Secondary | ICD-10-CM | POA: Diagnosis not present

## 2021-08-19 DIAGNOSIS — K219 Gastro-esophageal reflux disease without esophagitis: Secondary | ICD-10-CM | POA: Diagnosis not present

## 2021-08-19 DIAGNOSIS — J449 Chronic obstructive pulmonary disease, unspecified: Secondary | ICD-10-CM | POA: Diagnosis not present

## 2021-08-19 DIAGNOSIS — J9611 Chronic respiratory failure with hypoxia: Secondary | ICD-10-CM | POA: Diagnosis not present

## 2021-08-19 DIAGNOSIS — M549 Dorsalgia, unspecified: Secondary | ICD-10-CM | POA: Diagnosis not present

## 2021-08-19 DIAGNOSIS — G4700 Insomnia, unspecified: Secondary | ICD-10-CM | POA: Diagnosis not present

## 2021-08-19 DIAGNOSIS — F17210 Nicotine dependence, cigarettes, uncomplicated: Secondary | ICD-10-CM | POA: Diagnosis not present

## 2021-08-19 DIAGNOSIS — G8929 Other chronic pain: Secondary | ICD-10-CM | POA: Diagnosis not present

## 2021-08-26 DIAGNOSIS — R5383 Other fatigue: Secondary | ICD-10-CM | POA: Diagnosis not present

## 2021-08-26 DIAGNOSIS — Z299 Encounter for prophylactic measures, unspecified: Secondary | ICD-10-CM | POA: Diagnosis not present

## 2021-08-26 DIAGNOSIS — R110 Nausea: Secondary | ICD-10-CM | POA: Diagnosis not present

## 2021-08-26 DIAGNOSIS — E7800 Pure hypercholesterolemia, unspecified: Secondary | ICD-10-CM | POA: Diagnosis not present

## 2021-08-26 DIAGNOSIS — Z0000 Encounter for general adult medical examination without abnormal findings: Secondary | ICD-10-CM | POA: Diagnosis not present

## 2021-08-26 DIAGNOSIS — Z7189 Other specified counseling: Secondary | ICD-10-CM | POA: Diagnosis not present

## 2021-08-26 DIAGNOSIS — E039 Hypothyroidism, unspecified: Secondary | ICD-10-CM | POA: Diagnosis not present

## 2021-08-26 DIAGNOSIS — Z79899 Other long term (current) drug therapy: Secondary | ICD-10-CM | POA: Diagnosis not present

## 2021-08-26 DIAGNOSIS — M545 Low back pain, unspecified: Secondary | ICD-10-CM | POA: Diagnosis not present

## 2021-08-26 DIAGNOSIS — I10 Essential (primary) hypertension: Secondary | ICD-10-CM | POA: Diagnosis not present

## 2021-08-26 DIAGNOSIS — Z1211 Encounter for screening for malignant neoplasm of colon: Secondary | ICD-10-CM | POA: Diagnosis not present

## 2021-08-27 DIAGNOSIS — I10 Essential (primary) hypertension: Secondary | ICD-10-CM | POA: Diagnosis not present

## 2021-08-28 DIAGNOSIS — Z9981 Dependence on supplemental oxygen: Secondary | ICD-10-CM | POA: Diagnosis not present

## 2021-08-28 DIAGNOSIS — R0902 Hypoxemia: Secondary | ICD-10-CM | POA: Diagnosis not present

## 2021-08-28 DIAGNOSIS — E785 Hyperlipidemia, unspecified: Secondary | ICD-10-CM | POA: Diagnosis not present

## 2021-08-28 DIAGNOSIS — I517 Cardiomegaly: Secondary | ICD-10-CM | POA: Diagnosis not present

## 2021-08-28 DIAGNOSIS — R404 Transient alteration of awareness: Secondary | ICD-10-CM | POA: Diagnosis not present

## 2021-08-28 DIAGNOSIS — E119 Type 2 diabetes mellitus without complications: Secondary | ICD-10-CM | POA: Diagnosis not present

## 2021-08-28 DIAGNOSIS — R062 Wheezing: Secondary | ICD-10-CM | POA: Diagnosis not present

## 2021-08-28 DIAGNOSIS — R001 Bradycardia, unspecified: Secondary | ICD-10-CM | POA: Diagnosis not present

## 2021-08-28 DIAGNOSIS — R0602 Shortness of breath: Secondary | ICD-10-CM | POA: Diagnosis not present

## 2021-08-28 DIAGNOSIS — J441 Chronic obstructive pulmonary disease with (acute) exacerbation: Secondary | ICD-10-CM | POA: Diagnosis not present

## 2021-08-28 DIAGNOSIS — Z794 Long term (current) use of insulin: Secondary | ICD-10-CM | POA: Diagnosis not present

## 2021-08-28 DIAGNOSIS — F17210 Nicotine dependence, cigarettes, uncomplicated: Secondary | ICD-10-CM | POA: Diagnosis not present

## 2021-08-28 DIAGNOSIS — Z20822 Contact with and (suspected) exposure to covid-19: Secondary | ICD-10-CM | POA: Diagnosis not present

## 2021-08-28 DIAGNOSIS — E039 Hypothyroidism, unspecified: Secondary | ICD-10-CM | POA: Diagnosis not present

## 2021-08-28 DIAGNOSIS — Z7951 Long term (current) use of inhaled steroids: Secondary | ICD-10-CM | POA: Diagnosis not present

## 2021-08-28 DIAGNOSIS — Z23 Encounter for immunization: Secondary | ICD-10-CM | POA: Diagnosis not present

## 2021-08-28 DIAGNOSIS — J9620 Acute and chronic respiratory failure, unspecified whether with hypoxia or hypercapnia: Secondary | ICD-10-CM | POA: Diagnosis not present

## 2021-08-28 DIAGNOSIS — Z7984 Long term (current) use of oral hypoglycemic drugs: Secondary | ICD-10-CM | POA: Diagnosis not present

## 2021-08-28 DIAGNOSIS — Z7952 Long term (current) use of systemic steroids: Secondary | ICD-10-CM | POA: Diagnosis not present

## 2021-08-28 DIAGNOSIS — I10 Essential (primary) hypertension: Secondary | ICD-10-CM | POA: Diagnosis not present

## 2021-08-28 DIAGNOSIS — M797 Fibromyalgia: Secondary | ICD-10-CM | POA: Diagnosis not present

## 2021-08-28 DIAGNOSIS — Z9989 Dependence on other enabling machines and devices: Secondary | ICD-10-CM | POA: Diagnosis not present

## 2021-08-28 DIAGNOSIS — E1165 Type 2 diabetes mellitus with hyperglycemia: Secondary | ICD-10-CM | POA: Diagnosis not present

## 2021-09-03 DIAGNOSIS — J9611 Chronic respiratory failure with hypoxia: Secondary | ICD-10-CM | POA: Diagnosis not present

## 2021-09-03 DIAGNOSIS — G4700 Insomnia, unspecified: Secondary | ICD-10-CM | POA: Diagnosis not present

## 2021-09-03 DIAGNOSIS — M797 Fibromyalgia: Secondary | ICD-10-CM | POA: Diagnosis not present

## 2021-09-03 DIAGNOSIS — G8929 Other chronic pain: Secondary | ICD-10-CM | POA: Diagnosis not present

## 2021-09-03 DIAGNOSIS — Z9981 Dependence on supplemental oxygen: Secondary | ICD-10-CM | POA: Diagnosis not present

## 2021-09-03 DIAGNOSIS — K219 Gastro-esophageal reflux disease without esophagitis: Secondary | ICD-10-CM | POA: Diagnosis not present

## 2021-09-03 DIAGNOSIS — J9612 Chronic respiratory failure with hypercapnia: Secondary | ICD-10-CM | POA: Diagnosis not present

## 2021-09-03 DIAGNOSIS — E039 Hypothyroidism, unspecified: Secondary | ICD-10-CM | POA: Diagnosis not present

## 2021-09-03 DIAGNOSIS — E119 Type 2 diabetes mellitus without complications: Secondary | ICD-10-CM | POA: Diagnosis not present

## 2021-09-03 DIAGNOSIS — F17210 Nicotine dependence, cigarettes, uncomplicated: Secondary | ICD-10-CM | POA: Diagnosis not present

## 2021-09-03 DIAGNOSIS — I110 Hypertensive heart disease with heart failure: Secondary | ICD-10-CM | POA: Diagnosis not present

## 2021-09-03 DIAGNOSIS — Z7984 Long term (current) use of oral hypoglycemic drugs: Secondary | ICD-10-CM | POA: Diagnosis not present

## 2021-09-03 DIAGNOSIS — E785 Hyperlipidemia, unspecified: Secondary | ICD-10-CM | POA: Diagnosis not present

## 2021-09-03 DIAGNOSIS — M549 Dorsalgia, unspecified: Secondary | ICD-10-CM | POA: Diagnosis not present

## 2021-09-03 DIAGNOSIS — I5030 Unspecified diastolic (congestive) heart failure: Secondary | ICD-10-CM | POA: Diagnosis not present

## 2021-09-03 DIAGNOSIS — J449 Chronic obstructive pulmonary disease, unspecified: Secondary | ICD-10-CM | POA: Diagnosis not present

## 2021-09-03 DIAGNOSIS — G4733 Obstructive sleep apnea (adult) (pediatric): Secondary | ICD-10-CM | POA: Diagnosis not present

## 2021-09-07 DIAGNOSIS — R062 Wheezing: Secondary | ICD-10-CM | POA: Diagnosis not present

## 2021-09-07 DIAGNOSIS — M4800 Spinal stenosis, site unspecified: Secondary | ICD-10-CM | POA: Diagnosis not present

## 2021-09-07 DIAGNOSIS — M5136 Other intervertebral disc degeneration, lumbar region: Secondary | ICD-10-CM | POA: Diagnosis not present

## 2021-09-07 DIAGNOSIS — J449 Chronic obstructive pulmonary disease, unspecified: Secondary | ICD-10-CM | POA: Diagnosis not present

## 2021-09-10 DIAGNOSIS — E1165 Type 2 diabetes mellitus with hyperglycemia: Secondary | ICD-10-CM | POA: Diagnosis not present

## 2021-09-10 DIAGNOSIS — Z299 Encounter for prophylactic measures, unspecified: Secondary | ICD-10-CM | POA: Diagnosis not present

## 2021-09-10 DIAGNOSIS — G4733 Obstructive sleep apnea (adult) (pediatric): Secondary | ICD-10-CM | POA: Diagnosis not present

## 2021-09-10 DIAGNOSIS — J9611 Chronic respiratory failure with hypoxia: Secondary | ICD-10-CM | POA: Diagnosis not present

## 2021-09-10 DIAGNOSIS — I10 Essential (primary) hypertension: Secondary | ICD-10-CM | POA: Diagnosis not present

## 2021-09-12 DIAGNOSIS — M797 Fibromyalgia: Secondary | ICD-10-CM | POA: Diagnosis not present

## 2021-09-12 DIAGNOSIS — J9612 Chronic respiratory failure with hypercapnia: Secondary | ICD-10-CM | POA: Diagnosis not present

## 2021-09-12 DIAGNOSIS — K219 Gastro-esophageal reflux disease without esophagitis: Secondary | ICD-10-CM | POA: Diagnosis not present

## 2021-09-12 DIAGNOSIS — E119 Type 2 diabetes mellitus without complications: Secondary | ICD-10-CM | POA: Diagnosis not present

## 2021-09-12 DIAGNOSIS — G4700 Insomnia, unspecified: Secondary | ICD-10-CM | POA: Diagnosis not present

## 2021-09-12 DIAGNOSIS — I5030 Unspecified diastolic (congestive) heart failure: Secondary | ICD-10-CM | POA: Diagnosis not present

## 2021-09-12 DIAGNOSIS — Z9981 Dependence on supplemental oxygen: Secondary | ICD-10-CM | POA: Diagnosis not present

## 2021-09-12 DIAGNOSIS — E039 Hypothyroidism, unspecified: Secondary | ICD-10-CM | POA: Diagnosis not present

## 2021-09-12 DIAGNOSIS — G4733 Obstructive sleep apnea (adult) (pediatric): Secondary | ICD-10-CM | POA: Diagnosis not present

## 2021-09-12 DIAGNOSIS — M549 Dorsalgia, unspecified: Secondary | ICD-10-CM | POA: Diagnosis not present

## 2021-09-12 DIAGNOSIS — G8929 Other chronic pain: Secondary | ICD-10-CM | POA: Diagnosis not present

## 2021-09-12 DIAGNOSIS — E785 Hyperlipidemia, unspecified: Secondary | ICD-10-CM | POA: Diagnosis not present

## 2021-09-12 DIAGNOSIS — F17210 Nicotine dependence, cigarettes, uncomplicated: Secondary | ICD-10-CM | POA: Diagnosis not present

## 2021-09-12 DIAGNOSIS — J9611 Chronic respiratory failure with hypoxia: Secondary | ICD-10-CM | POA: Diagnosis not present

## 2021-09-12 DIAGNOSIS — J449 Chronic obstructive pulmonary disease, unspecified: Secondary | ICD-10-CM | POA: Diagnosis not present

## 2021-09-12 DIAGNOSIS — Z7984 Long term (current) use of oral hypoglycemic drugs: Secondary | ICD-10-CM | POA: Diagnosis not present

## 2021-09-12 DIAGNOSIS — I110 Hypertensive heart disease with heart failure: Secondary | ICD-10-CM | POA: Diagnosis not present

## 2021-09-15 DIAGNOSIS — E785 Hyperlipidemia, unspecified: Secondary | ICD-10-CM | POA: Diagnosis not present

## 2021-09-15 DIAGNOSIS — J9611 Chronic respiratory failure with hypoxia: Secondary | ICD-10-CM | POA: Diagnosis not present

## 2021-09-15 DIAGNOSIS — G4700 Insomnia, unspecified: Secondary | ICD-10-CM | POA: Diagnosis not present

## 2021-09-15 DIAGNOSIS — M797 Fibromyalgia: Secondary | ICD-10-CM | POA: Diagnosis not present

## 2021-09-15 DIAGNOSIS — J9612 Chronic respiratory failure with hypercapnia: Secondary | ICD-10-CM | POA: Diagnosis not present

## 2021-09-15 DIAGNOSIS — F17210 Nicotine dependence, cigarettes, uncomplicated: Secondary | ICD-10-CM | POA: Diagnosis not present

## 2021-09-15 DIAGNOSIS — K219 Gastro-esophageal reflux disease without esophagitis: Secondary | ICD-10-CM | POA: Diagnosis not present

## 2021-09-15 DIAGNOSIS — Z7984 Long term (current) use of oral hypoglycemic drugs: Secondary | ICD-10-CM | POA: Diagnosis not present

## 2021-09-15 DIAGNOSIS — Z9981 Dependence on supplemental oxygen: Secondary | ICD-10-CM | POA: Diagnosis not present

## 2021-09-15 DIAGNOSIS — M549 Dorsalgia, unspecified: Secondary | ICD-10-CM | POA: Diagnosis not present

## 2021-09-15 DIAGNOSIS — G4733 Obstructive sleep apnea (adult) (pediatric): Secondary | ICD-10-CM | POA: Diagnosis not present

## 2021-09-15 DIAGNOSIS — E119 Type 2 diabetes mellitus without complications: Secondary | ICD-10-CM | POA: Diagnosis not present

## 2021-09-15 DIAGNOSIS — E039 Hypothyroidism, unspecified: Secondary | ICD-10-CM | POA: Diagnosis not present

## 2021-09-15 DIAGNOSIS — G8929 Other chronic pain: Secondary | ICD-10-CM | POA: Diagnosis not present

## 2021-09-15 DIAGNOSIS — I5030 Unspecified diastolic (congestive) heart failure: Secondary | ICD-10-CM | POA: Diagnosis not present

## 2021-09-15 DIAGNOSIS — J449 Chronic obstructive pulmonary disease, unspecified: Secondary | ICD-10-CM | POA: Diagnosis not present

## 2021-09-15 DIAGNOSIS — I110 Hypertensive heart disease with heart failure: Secondary | ICD-10-CM | POA: Diagnosis not present

## 2021-09-26 DIAGNOSIS — I10 Essential (primary) hypertension: Secondary | ICD-10-CM | POA: Diagnosis not present

## 2021-09-30 DIAGNOSIS — R0683 Snoring: Secondary | ICD-10-CM | POA: Diagnosis not present

## 2021-09-30 DIAGNOSIS — Z299 Encounter for prophylactic measures, unspecified: Secondary | ICD-10-CM | POA: Diagnosis not present

## 2021-09-30 DIAGNOSIS — Z87891 Personal history of nicotine dependence: Secondary | ICD-10-CM | POA: Diagnosis not present

## 2021-09-30 DIAGNOSIS — J9611 Chronic respiratory failure with hypoxia: Secondary | ICD-10-CM | POA: Diagnosis not present

## 2021-09-30 DIAGNOSIS — J449 Chronic obstructive pulmonary disease, unspecified: Secondary | ICD-10-CM | POA: Diagnosis not present

## 2021-09-30 DIAGNOSIS — G4730 Sleep apnea, unspecified: Secondary | ICD-10-CM | POA: Diagnosis not present

## 2021-09-30 DIAGNOSIS — U071 COVID-19: Secondary | ICD-10-CM | POA: Diagnosis not present

## 2021-10-08 DIAGNOSIS — M5136 Other intervertebral disc degeneration, lumbar region: Secondary | ICD-10-CM | POA: Diagnosis not present

## 2021-10-08 DIAGNOSIS — R062 Wheezing: Secondary | ICD-10-CM | POA: Diagnosis not present

## 2021-10-08 DIAGNOSIS — J449 Chronic obstructive pulmonary disease, unspecified: Secondary | ICD-10-CM | POA: Diagnosis not present

## 2021-10-08 DIAGNOSIS — M4800 Spinal stenosis, site unspecified: Secondary | ICD-10-CM | POA: Diagnosis not present

## 2021-10-26 DIAGNOSIS — I10 Essential (primary) hypertension: Secondary | ICD-10-CM | POA: Diagnosis not present

## 2021-11-06 DIAGNOSIS — G4733 Obstructive sleep apnea (adult) (pediatric): Secondary | ICD-10-CM | POA: Diagnosis not present

## 2021-11-06 DIAGNOSIS — J939 Pneumothorax, unspecified: Secondary | ICD-10-CM | POA: Diagnosis not present

## 2021-11-06 DIAGNOSIS — M4800 Spinal stenosis, site unspecified: Secondary | ICD-10-CM | POA: Diagnosis not present

## 2021-11-06 DIAGNOSIS — J449 Chronic obstructive pulmonary disease, unspecified: Secondary | ICD-10-CM | POA: Diagnosis not present

## 2021-11-06 DIAGNOSIS — Z299 Encounter for prophylactic measures, unspecified: Secondary | ICD-10-CM | POA: Diagnosis not present

## 2021-11-06 DIAGNOSIS — Z789 Other specified health status: Secondary | ICD-10-CM | POA: Diagnosis not present

## 2021-11-06 DIAGNOSIS — M5136 Other intervertebral disc degeneration, lumbar region: Secondary | ICD-10-CM | POA: Diagnosis not present

## 2021-11-06 DIAGNOSIS — E1165 Type 2 diabetes mellitus with hyperglycemia: Secondary | ICD-10-CM | POA: Diagnosis not present

## 2021-11-06 DIAGNOSIS — G4731 Primary central sleep apnea: Secondary | ICD-10-CM | POA: Diagnosis not present

## 2021-11-06 DIAGNOSIS — E039 Hypothyroidism, unspecified: Secondary | ICD-10-CM | POA: Diagnosis not present

## 2021-11-06 DIAGNOSIS — I10 Essential (primary) hypertension: Secondary | ICD-10-CM | POA: Diagnosis not present

## 2021-11-08 DIAGNOSIS — R062 Wheezing: Secondary | ICD-10-CM | POA: Diagnosis not present

## 2021-11-08 DIAGNOSIS — M4800 Spinal stenosis, site unspecified: Secondary | ICD-10-CM | POA: Diagnosis not present

## 2021-11-08 DIAGNOSIS — J449 Chronic obstructive pulmonary disease, unspecified: Secondary | ICD-10-CM | POA: Diagnosis not present

## 2021-11-08 DIAGNOSIS — M5136 Other intervertebral disc degeneration, lumbar region: Secondary | ICD-10-CM | POA: Diagnosis not present

## 2021-11-25 DIAGNOSIS — I10 Essential (primary) hypertension: Secondary | ICD-10-CM | POA: Diagnosis not present

## 2021-12-06 DIAGNOSIS — M4800 Spinal stenosis, site unspecified: Secondary | ICD-10-CM | POA: Diagnosis not present

## 2021-12-06 DIAGNOSIS — J449 Chronic obstructive pulmonary disease, unspecified: Secondary | ICD-10-CM | POA: Diagnosis not present

## 2021-12-06 DIAGNOSIS — M5136 Other intervertebral disc degeneration, lumbar region: Secondary | ICD-10-CM | POA: Diagnosis not present

## 2021-12-06 DIAGNOSIS — R062 Wheezing: Secondary | ICD-10-CM | POA: Diagnosis not present

## 2021-12-08 DIAGNOSIS — G4731 Primary central sleep apnea: Secondary | ICD-10-CM | POA: Diagnosis not present

## 2021-12-08 DIAGNOSIS — J449 Chronic obstructive pulmonary disease, unspecified: Secondary | ICD-10-CM | POA: Diagnosis not present

## 2021-12-25 DIAGNOSIS — I10 Essential (primary) hypertension: Secondary | ICD-10-CM | POA: Diagnosis not present

## 2021-12-27 DIAGNOSIS — J939 Pneumothorax, unspecified: Secondary | ICD-10-CM | POA: Diagnosis not present

## 2021-12-27 DIAGNOSIS — M5136 Other intervertebral disc degeneration, lumbar region: Secondary | ICD-10-CM | POA: Diagnosis not present

## 2021-12-27 DIAGNOSIS — G4731 Primary central sleep apnea: Secondary | ICD-10-CM | POA: Diagnosis not present

## 2021-12-27 DIAGNOSIS — M4800 Spinal stenosis, site unspecified: Secondary | ICD-10-CM | POA: Diagnosis not present

## 2022-01-06 DIAGNOSIS — M4800 Spinal stenosis, site unspecified: Secondary | ICD-10-CM | POA: Diagnosis not present

## 2022-01-06 DIAGNOSIS — J449 Chronic obstructive pulmonary disease, unspecified: Secondary | ICD-10-CM | POA: Diagnosis not present

## 2022-01-06 DIAGNOSIS — R062 Wheezing: Secondary | ICD-10-CM | POA: Diagnosis not present

## 2022-01-06 DIAGNOSIS — M5136 Other intervertebral disc degeneration, lumbar region: Secondary | ICD-10-CM | POA: Diagnosis not present

## 2022-01-25 DIAGNOSIS — I10 Essential (primary) hypertension: Secondary | ICD-10-CM | POA: Diagnosis not present

## 2022-02-03 DIAGNOSIS — J939 Pneumothorax, unspecified: Secondary | ICD-10-CM | POA: Diagnosis not present

## 2022-02-03 DIAGNOSIS — G4731 Primary central sleep apnea: Secondary | ICD-10-CM | POA: Diagnosis not present

## 2022-02-03 DIAGNOSIS — M5136 Other intervertebral disc degeneration, lumbar region: Secondary | ICD-10-CM | POA: Diagnosis not present

## 2022-02-03 DIAGNOSIS — M4800 Spinal stenosis, site unspecified: Secondary | ICD-10-CM | POA: Diagnosis not present

## 2022-02-05 DIAGNOSIS — R062 Wheezing: Secondary | ICD-10-CM | POA: Diagnosis not present

## 2022-02-05 DIAGNOSIS — J449 Chronic obstructive pulmonary disease, unspecified: Secondary | ICD-10-CM | POA: Diagnosis not present

## 2022-02-05 DIAGNOSIS — M5136 Other intervertebral disc degeneration, lumbar region: Secondary | ICD-10-CM | POA: Diagnosis not present

## 2022-02-05 DIAGNOSIS — M4800 Spinal stenosis, site unspecified: Secondary | ICD-10-CM | POA: Diagnosis not present

## 2022-02-24 DIAGNOSIS — I10 Essential (primary) hypertension: Secondary | ICD-10-CM | POA: Diagnosis not present

## 2022-02-25 DIAGNOSIS — I10 Essential (primary) hypertension: Secondary | ICD-10-CM | POA: Diagnosis not present

## 2022-02-25 DIAGNOSIS — J9611 Chronic respiratory failure with hypoxia: Secondary | ICD-10-CM | POA: Diagnosis not present

## 2022-02-25 DIAGNOSIS — E1165 Type 2 diabetes mellitus with hyperglycemia: Secondary | ICD-10-CM | POA: Diagnosis not present

## 2022-02-25 DIAGNOSIS — F17210 Nicotine dependence, cigarettes, uncomplicated: Secondary | ICD-10-CM | POA: Diagnosis not present

## 2022-02-25 DIAGNOSIS — Z299 Encounter for prophylactic measures, unspecified: Secondary | ICD-10-CM | POA: Diagnosis not present

## 2022-03-06 DIAGNOSIS — M5136 Other intervertebral disc degeneration, lumbar region: Secondary | ICD-10-CM | POA: Diagnosis not present

## 2022-03-06 DIAGNOSIS — G4731 Primary central sleep apnea: Secondary | ICD-10-CM | POA: Diagnosis not present

## 2022-03-06 DIAGNOSIS — M4800 Spinal stenosis, site unspecified: Secondary | ICD-10-CM | POA: Diagnosis not present

## 2022-03-06 DIAGNOSIS — J939 Pneumothorax, unspecified: Secondary | ICD-10-CM | POA: Diagnosis not present

## 2022-03-08 DIAGNOSIS — R062 Wheezing: Secondary | ICD-10-CM | POA: Diagnosis not present

## 2022-03-08 DIAGNOSIS — M5136 Other intervertebral disc degeneration, lumbar region: Secondary | ICD-10-CM | POA: Diagnosis not present

## 2022-03-08 DIAGNOSIS — M4800 Spinal stenosis, site unspecified: Secondary | ICD-10-CM | POA: Diagnosis not present

## 2022-03-08 DIAGNOSIS — J449 Chronic obstructive pulmonary disease, unspecified: Secondary | ICD-10-CM | POA: Diagnosis not present

## 2022-03-11 DIAGNOSIS — G4731 Primary central sleep apnea: Secondary | ICD-10-CM | POA: Diagnosis not present

## 2022-03-11 DIAGNOSIS — J449 Chronic obstructive pulmonary disease, unspecified: Secondary | ICD-10-CM | POA: Diagnosis not present

## 2022-03-26 DIAGNOSIS — I10 Essential (primary) hypertension: Secondary | ICD-10-CM | POA: Diagnosis not present

## 2022-04-05 DIAGNOSIS — J939 Pneumothorax, unspecified: Secondary | ICD-10-CM | POA: Diagnosis not present

## 2022-04-05 DIAGNOSIS — M5136 Other intervertebral disc degeneration, lumbar region: Secondary | ICD-10-CM | POA: Diagnosis not present

## 2022-04-05 DIAGNOSIS — G4731 Primary central sleep apnea: Secondary | ICD-10-CM | POA: Diagnosis not present

## 2022-04-05 DIAGNOSIS — M4800 Spinal stenosis, site unspecified: Secondary | ICD-10-CM | POA: Diagnosis not present

## 2022-04-23 DIAGNOSIS — M549 Dorsalgia, unspecified: Secondary | ICD-10-CM | POA: Diagnosis not present

## 2022-04-23 DIAGNOSIS — M25551 Pain in right hip: Secondary | ICD-10-CM | POA: Diagnosis not present

## 2022-04-27 DIAGNOSIS — I10 Essential (primary) hypertension: Secondary | ICD-10-CM | POA: Diagnosis not present

## 2022-04-28 DIAGNOSIS — M4800 Spinal stenosis, site unspecified: Secondary | ICD-10-CM | POA: Diagnosis not present

## 2022-04-28 DIAGNOSIS — J449 Chronic obstructive pulmonary disease, unspecified: Secondary | ICD-10-CM | POA: Diagnosis not present

## 2022-04-28 DIAGNOSIS — M5136 Other intervertebral disc degeneration, lumbar region: Secondary | ICD-10-CM | POA: Diagnosis not present

## 2022-04-28 DIAGNOSIS — R062 Wheezing: Secondary | ICD-10-CM | POA: Diagnosis not present

## 2022-05-06 DIAGNOSIS — M5136 Other intervertebral disc degeneration, lumbar region: Secondary | ICD-10-CM | POA: Diagnosis not present

## 2022-05-06 DIAGNOSIS — J939 Pneumothorax, unspecified: Secondary | ICD-10-CM | POA: Diagnosis not present

## 2022-05-06 DIAGNOSIS — M4800 Spinal stenosis, site unspecified: Secondary | ICD-10-CM | POA: Diagnosis not present

## 2022-05-06 DIAGNOSIS — G4731 Primary central sleep apnea: Secondary | ICD-10-CM | POA: Diagnosis not present

## 2022-05-08 DIAGNOSIS — F17210 Nicotine dependence, cigarettes, uncomplicated: Secondary | ICD-10-CM | POA: Diagnosis not present

## 2022-05-08 DIAGNOSIS — I10 Essential (primary) hypertension: Secondary | ICD-10-CM | POA: Diagnosis not present

## 2022-05-08 DIAGNOSIS — M549 Dorsalgia, unspecified: Secondary | ICD-10-CM | POA: Diagnosis not present

## 2022-05-08 DIAGNOSIS — Z299 Encounter for prophylactic measures, unspecified: Secondary | ICD-10-CM | POA: Diagnosis not present

## 2022-05-27 DIAGNOSIS — I10 Essential (primary) hypertension: Secondary | ICD-10-CM | POA: Diagnosis not present

## 2022-05-28 DIAGNOSIS — E1165 Type 2 diabetes mellitus with hyperglycemia: Secondary | ICD-10-CM | POA: Diagnosis not present

## 2022-05-28 DIAGNOSIS — E039 Hypothyroidism, unspecified: Secondary | ICD-10-CM | POA: Diagnosis not present

## 2022-05-28 DIAGNOSIS — J449 Chronic obstructive pulmonary disease, unspecified: Secondary | ICD-10-CM | POA: Diagnosis not present

## 2022-06-06 DIAGNOSIS — G4731 Primary central sleep apnea: Secondary | ICD-10-CM | POA: Diagnosis not present

## 2022-06-06 DIAGNOSIS — J939 Pneumothorax, unspecified: Secondary | ICD-10-CM | POA: Diagnosis not present

## 2022-06-06 DIAGNOSIS — M4800 Spinal stenosis, site unspecified: Secondary | ICD-10-CM | POA: Diagnosis not present

## 2022-06-06 DIAGNOSIS — M5136 Other intervertebral disc degeneration, lumbar region: Secondary | ICD-10-CM | POA: Diagnosis not present

## 2022-06-08 DIAGNOSIS — M4800 Spinal stenosis, site unspecified: Secondary | ICD-10-CM | POA: Diagnosis not present

## 2022-06-08 DIAGNOSIS — J449 Chronic obstructive pulmonary disease, unspecified: Secondary | ICD-10-CM | POA: Diagnosis not present

## 2022-06-08 DIAGNOSIS — R062 Wheezing: Secondary | ICD-10-CM | POA: Diagnosis not present

## 2022-06-08 DIAGNOSIS — M5136 Other intervertebral disc degeneration, lumbar region: Secondary | ICD-10-CM | POA: Diagnosis not present

## 2022-06-10 DIAGNOSIS — J449 Chronic obstructive pulmonary disease, unspecified: Secondary | ICD-10-CM | POA: Diagnosis not present

## 2022-06-10 DIAGNOSIS — G4731 Primary central sleep apnea: Secondary | ICD-10-CM | POA: Diagnosis not present

## 2022-06-24 DIAGNOSIS — Z0000 Encounter for general adult medical examination without abnormal findings: Secondary | ICD-10-CM | POA: Diagnosis not present

## 2022-06-24 DIAGNOSIS — Z299 Encounter for prophylactic measures, unspecified: Secondary | ICD-10-CM | POA: Diagnosis not present

## 2022-06-24 DIAGNOSIS — J449 Chronic obstructive pulmonary disease, unspecified: Secondary | ICD-10-CM | POA: Diagnosis not present

## 2022-06-24 DIAGNOSIS — Z1389 Encounter for screening for other disorder: Secondary | ICD-10-CM | POA: Diagnosis not present

## 2022-06-24 DIAGNOSIS — Z7189 Other specified counseling: Secondary | ICD-10-CM | POA: Diagnosis not present

## 2022-06-24 DIAGNOSIS — Z23 Encounter for immunization: Secondary | ICD-10-CM | POA: Diagnosis not present

## 2022-06-24 DIAGNOSIS — E039 Hypothyroidism, unspecified: Secondary | ICD-10-CM | POA: Diagnosis not present

## 2022-06-24 DIAGNOSIS — Z136 Encounter for screening for cardiovascular disorders: Secondary | ICD-10-CM | POA: Diagnosis not present

## 2022-06-24 DIAGNOSIS — F17210 Nicotine dependence, cigarettes, uncomplicated: Secondary | ICD-10-CM | POA: Diagnosis not present

## 2022-06-24 DIAGNOSIS — I10 Essential (primary) hypertension: Secondary | ICD-10-CM | POA: Diagnosis not present

## 2022-06-26 DIAGNOSIS — Z79899 Other long term (current) drug therapy: Secondary | ICD-10-CM | POA: Diagnosis not present

## 2022-06-26 DIAGNOSIS — R5383 Other fatigue: Secondary | ICD-10-CM | POA: Diagnosis not present

## 2022-06-26 DIAGNOSIS — E1159 Type 2 diabetes mellitus with other circulatory complications: Secondary | ICD-10-CM | POA: Diagnosis not present

## 2022-06-26 DIAGNOSIS — E7800 Pure hypercholesterolemia, unspecified: Secondary | ICD-10-CM | POA: Diagnosis not present

## 2022-06-26 DIAGNOSIS — I10 Essential (primary) hypertension: Secondary | ICD-10-CM | POA: Diagnosis not present

## 2022-06-26 DIAGNOSIS — M8580 Other specified disorders of bone density and structure, unspecified site: Secondary | ICD-10-CM | POA: Diagnosis not present

## 2022-07-02 DIAGNOSIS — E039 Hypothyroidism, unspecified: Secondary | ICD-10-CM | POA: Diagnosis not present

## 2022-07-06 DIAGNOSIS — M4800 Spinal stenosis, site unspecified: Secondary | ICD-10-CM | POA: Diagnosis not present

## 2022-07-06 DIAGNOSIS — M5136 Other intervertebral disc degeneration, lumbar region: Secondary | ICD-10-CM | POA: Diagnosis not present

## 2022-07-06 DIAGNOSIS — J939 Pneumothorax, unspecified: Secondary | ICD-10-CM | POA: Diagnosis not present

## 2022-07-06 DIAGNOSIS — G4731 Primary central sleep apnea: Secondary | ICD-10-CM | POA: Diagnosis not present

## 2022-07-08 DIAGNOSIS — R062 Wheezing: Secondary | ICD-10-CM | POA: Diagnosis not present

## 2022-07-08 DIAGNOSIS — M5136 Other intervertebral disc degeneration, lumbar region: Secondary | ICD-10-CM | POA: Diagnosis not present

## 2022-07-08 DIAGNOSIS — M4800 Spinal stenosis, site unspecified: Secondary | ICD-10-CM | POA: Diagnosis not present

## 2022-07-08 DIAGNOSIS — J449 Chronic obstructive pulmonary disease, unspecified: Secondary | ICD-10-CM | POA: Diagnosis not present

## 2022-08-05 ENCOUNTER — Other Ambulatory Visit: Payer: Self-pay | Admitting: Internal Medicine

## 2022-08-05 DIAGNOSIS — Z1231 Encounter for screening mammogram for malignant neoplasm of breast: Secondary | ICD-10-CM

## 2022-08-06 DIAGNOSIS — M4800 Spinal stenosis, site unspecified: Secondary | ICD-10-CM | POA: Diagnosis not present

## 2022-08-06 DIAGNOSIS — J939 Pneumothorax, unspecified: Secondary | ICD-10-CM | POA: Diagnosis not present

## 2022-08-06 DIAGNOSIS — M5136 Other intervertebral disc degeneration, lumbar region: Secondary | ICD-10-CM | POA: Diagnosis not present

## 2022-08-06 DIAGNOSIS — G4731 Primary central sleep apnea: Secondary | ICD-10-CM | POA: Diagnosis not present

## 2022-08-08 DIAGNOSIS — M5136 Other intervertebral disc degeneration, lumbar region: Secondary | ICD-10-CM | POA: Diagnosis not present

## 2022-08-08 DIAGNOSIS — M4800 Spinal stenosis, site unspecified: Secondary | ICD-10-CM | POA: Diagnosis not present

## 2022-08-08 DIAGNOSIS — R062 Wheezing: Secondary | ICD-10-CM | POA: Diagnosis not present

## 2022-08-08 DIAGNOSIS — J449 Chronic obstructive pulmonary disease, unspecified: Secondary | ICD-10-CM | POA: Diagnosis not present

## 2022-08-19 ENCOUNTER — Ambulatory Visit
Admission: RE | Admit: 2022-08-19 | Discharge: 2022-08-19 | Disposition: A | Discharge status: Home / Self Care | Payer: Medicare Other | Source: Ambulatory Visit | Attending: Internal Medicine | Admitting: Internal Medicine

## 2022-08-19 DIAGNOSIS — Z1231 Encounter for screening mammogram for malignant neoplasm of breast: Secondary | ICD-10-CM

## 2022-09-01 DIAGNOSIS — J449 Chronic obstructive pulmonary disease, unspecified: Secondary | ICD-10-CM | POA: Diagnosis not present

## 2022-09-01 DIAGNOSIS — E039 Hypothyroidism, unspecified: Secondary | ICD-10-CM | POA: Diagnosis not present

## 2022-09-01 DIAGNOSIS — F17210 Nicotine dependence, cigarettes, uncomplicated: Secondary | ICD-10-CM | POA: Diagnosis not present

## 2022-09-01 DIAGNOSIS — Z0000 Encounter for general adult medical examination without abnormal findings: Secondary | ICD-10-CM | POA: Diagnosis not present

## 2022-09-01 DIAGNOSIS — I10 Essential (primary) hypertension: Secondary | ICD-10-CM | POA: Diagnosis not present

## 2022-09-01 DIAGNOSIS — Z299 Encounter for prophylactic measures, unspecified: Secondary | ICD-10-CM | POA: Diagnosis not present

## 2022-09-01 DIAGNOSIS — E1165 Type 2 diabetes mellitus with hyperglycemia: Secondary | ICD-10-CM | POA: Diagnosis not present

## 2022-09-05 DIAGNOSIS — J939 Pneumothorax, unspecified: Secondary | ICD-10-CM | POA: Diagnosis not present

## 2022-09-05 DIAGNOSIS — M5136 Other intervertebral disc degeneration, lumbar region: Secondary | ICD-10-CM | POA: Diagnosis not present

## 2022-09-05 DIAGNOSIS — M4800 Spinal stenosis, site unspecified: Secondary | ICD-10-CM | POA: Diagnosis not present

## 2022-09-05 DIAGNOSIS — G4731 Primary central sleep apnea: Secondary | ICD-10-CM | POA: Diagnosis not present

## 2022-09-07 DIAGNOSIS — R062 Wheezing: Secondary | ICD-10-CM | POA: Diagnosis not present

## 2022-09-07 DIAGNOSIS — M4800 Spinal stenosis, site unspecified: Secondary | ICD-10-CM | POA: Diagnosis not present

## 2022-09-07 DIAGNOSIS — J449 Chronic obstructive pulmonary disease, unspecified: Secondary | ICD-10-CM | POA: Diagnosis not present

## 2022-09-07 DIAGNOSIS — M5136 Other intervertebral disc degeneration, lumbar region: Secondary | ICD-10-CM | POA: Diagnosis not present

## 2022-09-11 DIAGNOSIS — G4731 Primary central sleep apnea: Secondary | ICD-10-CM | POA: Diagnosis not present

## 2022-09-11 DIAGNOSIS — J449 Chronic obstructive pulmonary disease, unspecified: Secondary | ICD-10-CM | POA: Diagnosis not present

## 2022-10-06 DIAGNOSIS — G4731 Primary central sleep apnea: Secondary | ICD-10-CM | POA: Diagnosis not present

## 2022-10-06 DIAGNOSIS — M4800 Spinal stenosis, site unspecified: Secondary | ICD-10-CM | POA: Diagnosis not present

## 2022-10-06 DIAGNOSIS — M5136 Other intervertebral disc degeneration, lumbar region: Secondary | ICD-10-CM | POA: Diagnosis not present

## 2022-10-06 DIAGNOSIS — J939 Pneumothorax, unspecified: Secondary | ICD-10-CM | POA: Diagnosis not present

## 2022-10-08 DIAGNOSIS — M4800 Spinal stenosis, site unspecified: Secondary | ICD-10-CM | POA: Diagnosis not present

## 2022-10-08 DIAGNOSIS — M5136 Other intervertebral disc degeneration, lumbar region: Secondary | ICD-10-CM | POA: Diagnosis not present

## 2022-10-08 DIAGNOSIS — R062 Wheezing: Secondary | ICD-10-CM | POA: Diagnosis not present

## 2022-10-08 DIAGNOSIS — J449 Chronic obstructive pulmonary disease, unspecified: Secondary | ICD-10-CM | POA: Diagnosis not present

## 2022-10-26 DIAGNOSIS — I10 Essential (primary) hypertension: Secondary | ICD-10-CM | POA: Diagnosis not present

## 2022-10-29 DIAGNOSIS — J449 Chronic obstructive pulmonary disease, unspecified: Secondary | ICD-10-CM | POA: Diagnosis not present

## 2022-10-29 DIAGNOSIS — G4731 Primary central sleep apnea: Secondary | ICD-10-CM | POA: Diagnosis not present

## 2022-11-02 DIAGNOSIS — I10 Essential (primary) hypertension: Secondary | ICD-10-CM | POA: Diagnosis not present

## 2022-11-02 DIAGNOSIS — Z299 Encounter for prophylactic measures, unspecified: Secondary | ICD-10-CM | POA: Diagnosis not present

## 2022-11-02 DIAGNOSIS — E1165 Type 2 diabetes mellitus with hyperglycemia: Secondary | ICD-10-CM | POA: Diagnosis not present

## 2022-11-02 DIAGNOSIS — J449 Chronic obstructive pulmonary disease, unspecified: Secondary | ICD-10-CM | POA: Diagnosis not present

## 2022-11-02 DIAGNOSIS — F17210 Nicotine dependence, cigarettes, uncomplicated: Secondary | ICD-10-CM | POA: Diagnosis not present

## 2022-11-02 DIAGNOSIS — J9611 Chronic respiratory failure with hypoxia: Secondary | ICD-10-CM | POA: Diagnosis not present

## 2022-11-06 DIAGNOSIS — M4800 Spinal stenosis, site unspecified: Secondary | ICD-10-CM | POA: Diagnosis not present

## 2022-11-06 DIAGNOSIS — G4731 Primary central sleep apnea: Secondary | ICD-10-CM | POA: Diagnosis not present

## 2022-11-06 DIAGNOSIS — J939 Pneumothorax, unspecified: Secondary | ICD-10-CM | POA: Diagnosis not present

## 2022-11-06 DIAGNOSIS — M5136 Other intervertebral disc degeneration, lumbar region: Secondary | ICD-10-CM | POA: Diagnosis not present

## 2022-11-08 DIAGNOSIS — J449 Chronic obstructive pulmonary disease, unspecified: Secondary | ICD-10-CM | POA: Diagnosis not present

## 2022-11-08 DIAGNOSIS — M5136 Other intervertebral disc degeneration, lumbar region: Secondary | ICD-10-CM | POA: Diagnosis not present

## 2022-11-08 DIAGNOSIS — R062 Wheezing: Secondary | ICD-10-CM | POA: Diagnosis not present

## 2022-11-08 DIAGNOSIS — M4800 Spinal stenosis, site unspecified: Secondary | ICD-10-CM | POA: Diagnosis not present

## 2022-11-25 DIAGNOSIS — I10 Essential (primary) hypertension: Secondary | ICD-10-CM | POA: Diagnosis not present

## 2022-12-05 DIAGNOSIS — M4800 Spinal stenosis, site unspecified: Secondary | ICD-10-CM | POA: Diagnosis not present

## 2022-12-05 DIAGNOSIS — J939 Pneumothorax, unspecified: Secondary | ICD-10-CM | POA: Diagnosis not present

## 2022-12-05 DIAGNOSIS — M5136 Other intervertebral disc degeneration, lumbar region: Secondary | ICD-10-CM | POA: Diagnosis not present

## 2022-12-05 DIAGNOSIS — G4731 Primary central sleep apnea: Secondary | ICD-10-CM | POA: Diagnosis not present

## 2022-12-07 DIAGNOSIS — M5136 Other intervertebral disc degeneration, lumbar region: Secondary | ICD-10-CM | POA: Diagnosis not present

## 2022-12-07 DIAGNOSIS — R062 Wheezing: Secondary | ICD-10-CM | POA: Diagnosis not present

## 2022-12-07 DIAGNOSIS — M4800 Spinal stenosis, site unspecified: Secondary | ICD-10-CM | POA: Diagnosis not present

## 2022-12-07 DIAGNOSIS — J449 Chronic obstructive pulmonary disease, unspecified: Secondary | ICD-10-CM | POA: Diagnosis not present

## 2022-12-11 DIAGNOSIS — J449 Chronic obstructive pulmonary disease, unspecified: Secondary | ICD-10-CM | POA: Diagnosis not present

## 2022-12-11 DIAGNOSIS — G4731 Primary central sleep apnea: Secondary | ICD-10-CM | POA: Diagnosis not present

## 2022-12-26 DIAGNOSIS — I10 Essential (primary) hypertension: Secondary | ICD-10-CM | POA: Diagnosis not present

## 2023-01-07 DIAGNOSIS — M5136 Other intervertebral disc degeneration, lumbar region: Secondary | ICD-10-CM | POA: Diagnosis not present

## 2023-01-07 DIAGNOSIS — R062 Wheezing: Secondary | ICD-10-CM | POA: Diagnosis not present

## 2023-01-07 DIAGNOSIS — J9611 Chronic respiratory failure with hypoxia: Secondary | ICD-10-CM | POA: Diagnosis not present

## 2023-01-07 DIAGNOSIS — J449 Chronic obstructive pulmonary disease, unspecified: Secondary | ICD-10-CM | POA: Diagnosis not present

## 2023-01-07 DIAGNOSIS — Z299 Encounter for prophylactic measures, unspecified: Secondary | ICD-10-CM | POA: Diagnosis not present

## 2023-01-07 DIAGNOSIS — M4800 Spinal stenosis, site unspecified: Secondary | ICD-10-CM | POA: Diagnosis not present

## 2023-01-07 DIAGNOSIS — I10 Essential (primary) hypertension: Secondary | ICD-10-CM | POA: Diagnosis not present

## 2023-01-18 DIAGNOSIS — R0602 Shortness of breath: Secondary | ICD-10-CM | POA: Diagnosis not present

## 2023-01-18 DIAGNOSIS — J811 Chronic pulmonary edema: Secondary | ICD-10-CM | POA: Diagnosis not present

## 2023-01-18 DIAGNOSIS — J449 Chronic obstructive pulmonary disease, unspecified: Secondary | ICD-10-CM | POA: Diagnosis not present

## 2023-01-18 DIAGNOSIS — J439 Emphysema, unspecified: Secondary | ICD-10-CM | POA: Diagnosis not present

## 2023-01-21 DIAGNOSIS — J449 Chronic obstructive pulmonary disease, unspecified: Secondary | ICD-10-CM | POA: Diagnosis not present

## 2023-01-21 DIAGNOSIS — Z299 Encounter for prophylactic measures, unspecified: Secondary | ICD-10-CM | POA: Diagnosis not present

## 2023-01-21 DIAGNOSIS — I10 Essential (primary) hypertension: Secondary | ICD-10-CM | POA: Diagnosis not present

## 2023-01-21 DIAGNOSIS — J9611 Chronic respiratory failure with hypoxia: Secondary | ICD-10-CM | POA: Diagnosis not present

## 2023-01-26 DIAGNOSIS — I10 Essential (primary) hypertension: Secondary | ICD-10-CM | POA: Diagnosis not present

## 2023-01-27 DIAGNOSIS — J449 Chronic obstructive pulmonary disease, unspecified: Secondary | ICD-10-CM | POA: Diagnosis not present

## 2023-01-27 DIAGNOSIS — G4731 Primary central sleep apnea: Secondary | ICD-10-CM | POA: Diagnosis not present

## 2023-02-06 DIAGNOSIS — R062 Wheezing: Secondary | ICD-10-CM | POA: Diagnosis not present

## 2023-02-06 DIAGNOSIS — M5136 Other intervertebral disc degeneration, lumbar region: Secondary | ICD-10-CM | POA: Diagnosis not present

## 2023-02-06 DIAGNOSIS — M4800 Spinal stenosis, site unspecified: Secondary | ICD-10-CM | POA: Diagnosis not present

## 2023-02-06 DIAGNOSIS — J449 Chronic obstructive pulmonary disease, unspecified: Secondary | ICD-10-CM | POA: Diagnosis not present

## 2023-02-26 DIAGNOSIS — E7800 Pure hypercholesterolemia, unspecified: Secondary | ICD-10-CM | POA: Diagnosis not present

## 2023-02-26 DIAGNOSIS — I10 Essential (primary) hypertension: Secondary | ICD-10-CM | POA: Diagnosis not present

## 2023-03-09 DIAGNOSIS — M4800 Spinal stenosis, site unspecified: Secondary | ICD-10-CM | POA: Diagnosis not present

## 2023-03-09 DIAGNOSIS — R062 Wheezing: Secondary | ICD-10-CM | POA: Diagnosis not present

## 2023-03-09 DIAGNOSIS — J449 Chronic obstructive pulmonary disease, unspecified: Secondary | ICD-10-CM | POA: Diagnosis not present

## 2023-03-09 DIAGNOSIS — M5136 Other intervertebral disc degeneration, lumbar region: Secondary | ICD-10-CM | POA: Diagnosis not present

## 2023-03-11 DIAGNOSIS — E039 Hypothyroidism, unspecified: Secondary | ICD-10-CM | POA: Diagnosis not present

## 2023-03-11 DIAGNOSIS — J449 Chronic obstructive pulmonary disease, unspecified: Secondary | ICD-10-CM | POA: Diagnosis not present

## 2023-03-11 DIAGNOSIS — I10 Essential (primary) hypertension: Secondary | ICD-10-CM | POA: Diagnosis not present

## 2023-03-11 DIAGNOSIS — Z299 Encounter for prophylactic measures, unspecified: Secondary | ICD-10-CM | POA: Diagnosis not present

## 2023-03-11 DIAGNOSIS — E1165 Type 2 diabetes mellitus with hyperglycemia: Secondary | ICD-10-CM | POA: Diagnosis not present

## 2023-03-13 DIAGNOSIS — J449 Chronic obstructive pulmonary disease, unspecified: Secondary | ICD-10-CM | POA: Diagnosis not present

## 2023-03-13 DIAGNOSIS — G4731 Primary central sleep apnea: Secondary | ICD-10-CM | POA: Diagnosis not present

## 2023-03-18 DIAGNOSIS — J449 Chronic obstructive pulmonary disease, unspecified: Secondary | ICD-10-CM | POA: Diagnosis not present

## 2023-03-18 DIAGNOSIS — G4731 Primary central sleep apnea: Secondary | ICD-10-CM | POA: Diagnosis not present

## 2023-03-28 DIAGNOSIS — I10 Essential (primary) hypertension: Secondary | ICD-10-CM | POA: Diagnosis not present

## 2023-04-08 DIAGNOSIS — R062 Wheezing: Secondary | ICD-10-CM | POA: Diagnosis not present

## 2023-04-08 DIAGNOSIS — M5136 Other intervertebral disc degeneration, lumbar region: Secondary | ICD-10-CM | POA: Diagnosis not present

## 2023-04-08 DIAGNOSIS — J449 Chronic obstructive pulmonary disease, unspecified: Secondary | ICD-10-CM | POA: Diagnosis not present

## 2023-04-08 DIAGNOSIS — M4800 Spinal stenosis, site unspecified: Secondary | ICD-10-CM | POA: Diagnosis not present

## 2023-04-28 DIAGNOSIS — Z7189 Other specified counseling: Secondary | ICD-10-CM | POA: Diagnosis not present

## 2023-04-28 DIAGNOSIS — R5383 Other fatigue: Secondary | ICD-10-CM | POA: Diagnosis not present

## 2023-04-28 DIAGNOSIS — I10 Essential (primary) hypertension: Secondary | ICD-10-CM | POA: Diagnosis not present

## 2023-04-28 DIAGNOSIS — Z0000 Encounter for general adult medical examination without abnormal findings: Secondary | ICD-10-CM | POA: Diagnosis not present

## 2023-04-28 DIAGNOSIS — E7800 Pure hypercholesterolemia, unspecified: Secondary | ICD-10-CM | POA: Diagnosis not present

## 2023-04-28 DIAGNOSIS — Z299 Encounter for prophylactic measures, unspecified: Secondary | ICD-10-CM | POA: Diagnosis not present

## 2023-04-28 DIAGNOSIS — E039 Hypothyroidism, unspecified: Secondary | ICD-10-CM | POA: Diagnosis not present

## 2023-04-28 DIAGNOSIS — M8580 Other specified disorders of bone density and structure, unspecified site: Secondary | ICD-10-CM | POA: Diagnosis not present

## 2023-04-28 DIAGNOSIS — Z79899 Other long term (current) drug therapy: Secondary | ICD-10-CM | POA: Diagnosis not present

## 2023-04-29 DIAGNOSIS — J449 Chronic obstructive pulmonary disease, unspecified: Secondary | ICD-10-CM | POA: Diagnosis not present

## 2023-04-29 DIAGNOSIS — G4731 Primary central sleep apnea: Secondary | ICD-10-CM | POA: Diagnosis not present

## 2023-05-09 DIAGNOSIS — R062 Wheezing: Secondary | ICD-10-CM | POA: Diagnosis not present

## 2023-05-09 DIAGNOSIS — J449 Chronic obstructive pulmonary disease, unspecified: Secondary | ICD-10-CM | POA: Diagnosis not present

## 2023-05-09 DIAGNOSIS — M4800 Spinal stenosis, site unspecified: Secondary | ICD-10-CM | POA: Diagnosis not present

## 2023-05-09 DIAGNOSIS — M5136 Other intervertebral disc degeneration, lumbar region: Secondary | ICD-10-CM | POA: Diagnosis not present

## 2023-05-20 DIAGNOSIS — I10 Essential (primary) hypertension: Secondary | ICD-10-CM | POA: Diagnosis not present

## 2023-05-20 DIAGNOSIS — Z299 Encounter for prophylactic measures, unspecified: Secondary | ICD-10-CM | POA: Diagnosis not present

## 2023-05-20 DIAGNOSIS — J449 Chronic obstructive pulmonary disease, unspecified: Secondary | ICD-10-CM | POA: Diagnosis not present

## 2023-05-20 DIAGNOSIS — Z0000 Encounter for general adult medical examination without abnormal findings: Secondary | ICD-10-CM | POA: Diagnosis not present

## 2023-05-20 DIAGNOSIS — I700 Atherosclerosis of aorta: Secondary | ICD-10-CM | POA: Diagnosis not present

## 2023-05-29 DIAGNOSIS — I10 Essential (primary) hypertension: Secondary | ICD-10-CM | POA: Diagnosis not present

## 2023-06-09 DIAGNOSIS — M4800 Spinal stenosis, site unspecified: Secondary | ICD-10-CM | POA: Diagnosis not present

## 2023-06-09 DIAGNOSIS — R062 Wheezing: Secondary | ICD-10-CM | POA: Diagnosis not present

## 2023-06-09 DIAGNOSIS — J449 Chronic obstructive pulmonary disease, unspecified: Secondary | ICD-10-CM | POA: Diagnosis not present

## 2023-06-09 DIAGNOSIS — M5136 Other intervertebral disc degeneration, lumbar region: Secondary | ICD-10-CM | POA: Diagnosis not present

## 2023-06-17 DIAGNOSIS — J449 Chronic obstructive pulmonary disease, unspecified: Secondary | ICD-10-CM | POA: Diagnosis not present

## 2023-06-17 DIAGNOSIS — G4731 Primary central sleep apnea: Secondary | ICD-10-CM | POA: Diagnosis not present

## 2023-06-18 DIAGNOSIS — J449 Chronic obstructive pulmonary disease, unspecified: Secondary | ICD-10-CM | POA: Diagnosis not present

## 2023-06-18 DIAGNOSIS — G4731 Primary central sleep apnea: Secondary | ICD-10-CM | POA: Diagnosis not present

## 2023-06-28 DIAGNOSIS — I10 Essential (primary) hypertension: Secondary | ICD-10-CM | POA: Diagnosis not present

## 2023-07-09 DIAGNOSIS — J449 Chronic obstructive pulmonary disease, unspecified: Secondary | ICD-10-CM | POA: Diagnosis not present

## 2023-07-09 DIAGNOSIS — M4800 Spinal stenosis, site unspecified: Secondary | ICD-10-CM | POA: Diagnosis not present

## 2023-07-09 DIAGNOSIS — M51369 Other intervertebral disc degeneration, lumbar region without mention of lumbar back pain or lower extremity pain: Secondary | ICD-10-CM | POA: Diagnosis not present

## 2023-07-09 DIAGNOSIS — R062 Wheezing: Secondary | ICD-10-CM | POA: Diagnosis not present

## 2023-07-20 DIAGNOSIS — R0600 Dyspnea, unspecified: Secondary | ICD-10-CM | POA: Diagnosis not present

## 2023-07-20 DIAGNOSIS — R0602 Shortness of breath: Secondary | ICD-10-CM | POA: Diagnosis not present

## 2023-07-20 DIAGNOSIS — J9811 Atelectasis: Secondary | ICD-10-CM | POA: Diagnosis not present

## 2023-07-20 DIAGNOSIS — I5030 Unspecified diastolic (congestive) heart failure: Secondary | ICD-10-CM | POA: Diagnosis not present

## 2023-07-20 DIAGNOSIS — Z7951 Long term (current) use of inhaled steroids: Secondary | ICD-10-CM | POA: Diagnosis not present

## 2023-07-20 DIAGNOSIS — I110 Hypertensive heart disease with heart failure: Secondary | ICD-10-CM | POA: Diagnosis not present

## 2023-07-20 DIAGNOSIS — I5033 Acute on chronic diastolic (congestive) heart failure: Secondary | ICD-10-CM | POA: Diagnosis not present

## 2023-07-20 DIAGNOSIS — R911 Solitary pulmonary nodule: Secondary | ICD-10-CM | POA: Diagnosis not present

## 2023-07-20 DIAGNOSIS — Z79899 Other long term (current) drug therapy: Secondary | ICD-10-CM | POA: Diagnosis not present

## 2023-07-20 DIAGNOSIS — R918 Other nonspecific abnormal finding of lung field: Secondary | ICD-10-CM | POA: Diagnosis not present

## 2023-07-20 DIAGNOSIS — J9622 Acute and chronic respiratory failure with hypercapnia: Secondary | ICD-10-CM | POA: Diagnosis not present

## 2023-07-20 DIAGNOSIS — Z7984 Long term (current) use of oral hypoglycemic drugs: Secondary | ICD-10-CM | POA: Diagnosis not present

## 2023-07-20 DIAGNOSIS — J9621 Acute and chronic respiratory failure with hypoxia: Secondary | ICD-10-CM | POA: Diagnosis not present

## 2023-07-20 DIAGNOSIS — I517 Cardiomegaly: Secondary | ICD-10-CM | POA: Diagnosis not present

## 2023-07-20 DIAGNOSIS — E119 Type 2 diabetes mellitus without complications: Secondary | ICD-10-CM | POA: Diagnosis not present

## 2023-07-20 DIAGNOSIS — J9611 Chronic respiratory failure with hypoxia: Secondary | ICD-10-CM | POA: Diagnosis not present

## 2023-07-20 DIAGNOSIS — Z743 Need for continuous supervision: Secondary | ICD-10-CM | POA: Diagnosis not present

## 2023-07-20 DIAGNOSIS — R079 Chest pain, unspecified: Secondary | ICD-10-CM | POA: Diagnosis not present

## 2023-07-20 DIAGNOSIS — G4733 Obstructive sleep apnea (adult) (pediatric): Secondary | ICD-10-CM | POA: Diagnosis not present

## 2023-07-20 DIAGNOSIS — I10 Essential (primary) hypertension: Secondary | ICD-10-CM | POA: Diagnosis not present

## 2023-07-20 DIAGNOSIS — M797 Fibromyalgia: Secondary | ICD-10-CM | POA: Diagnosis not present

## 2023-07-20 DIAGNOSIS — E785 Hyperlipidemia, unspecified: Secondary | ICD-10-CM | POA: Diagnosis not present

## 2023-07-20 DIAGNOSIS — J984 Other disorders of lung: Secondary | ICD-10-CM | POA: Diagnosis not present

## 2023-07-20 DIAGNOSIS — Z792 Long term (current) use of antibiotics: Secondary | ICD-10-CM | POA: Diagnosis not present

## 2023-07-20 DIAGNOSIS — Z9989 Dependence on other enabling machines and devices: Secondary | ICD-10-CM | POA: Diagnosis not present

## 2023-07-20 DIAGNOSIS — R0902 Hypoxemia: Secondary | ICD-10-CM | POA: Diagnosis not present

## 2023-07-20 DIAGNOSIS — Z7901 Long term (current) use of anticoagulants: Secondary | ICD-10-CM | POA: Diagnosis not present

## 2023-07-20 DIAGNOSIS — J90 Pleural effusion, not elsewhere classified: Secondary | ICD-10-CM | POA: Diagnosis not present

## 2023-07-20 DIAGNOSIS — I358 Other nonrheumatic aortic valve disorders: Secondary | ICD-10-CM | POA: Diagnosis not present

## 2023-07-20 DIAGNOSIS — J441 Chronic obstructive pulmonary disease with (acute) exacerbation: Secondary | ICD-10-CM | POA: Diagnosis not present

## 2023-07-20 DIAGNOSIS — R0989 Other specified symptoms and signs involving the circulatory and respiratory systems: Secondary | ICD-10-CM | POA: Diagnosis not present

## 2023-07-20 DIAGNOSIS — I272 Pulmonary hypertension, unspecified: Secondary | ICD-10-CM | POA: Diagnosis not present

## 2023-07-20 DIAGNOSIS — F17210 Nicotine dependence, cigarettes, uncomplicated: Secondary | ICD-10-CM | POA: Diagnosis not present

## 2023-07-20 DIAGNOSIS — Z888 Allergy status to other drugs, medicaments and biological substances status: Secondary | ICD-10-CM | POA: Diagnosis not present

## 2023-07-20 DIAGNOSIS — R0789 Other chest pain: Secondary | ICD-10-CM | POA: Diagnosis not present

## 2023-07-20 DIAGNOSIS — R6889 Other general symptoms and signs: Secondary | ICD-10-CM | POA: Diagnosis not present

## 2023-07-20 DIAGNOSIS — R059 Cough, unspecified: Secondary | ICD-10-CM | POA: Diagnosis not present

## 2023-07-30 DIAGNOSIS — G4731 Primary central sleep apnea: Secondary | ICD-10-CM | POA: Diagnosis not present

## 2023-07-30 DIAGNOSIS — J449 Chronic obstructive pulmonary disease, unspecified: Secondary | ICD-10-CM | POA: Diagnosis not present

## 2023-08-09 DIAGNOSIS — J449 Chronic obstructive pulmonary disease, unspecified: Secondary | ICD-10-CM | POA: Diagnosis not present

## 2023-08-09 DIAGNOSIS — M51369 Other intervertebral disc degeneration, lumbar region without mention of lumbar back pain or lower extremity pain: Secondary | ICD-10-CM | POA: Diagnosis not present

## 2023-08-09 DIAGNOSIS — R062 Wheezing: Secondary | ICD-10-CM | POA: Diagnosis not present

## 2023-08-09 DIAGNOSIS — M4800 Spinal stenosis, site unspecified: Secondary | ICD-10-CM | POA: Diagnosis not present

## 2023-08-27 DIAGNOSIS — I10 Essential (primary) hypertension: Secondary | ICD-10-CM | POA: Diagnosis not present

## 2023-08-31 DIAGNOSIS — G4733 Obstructive sleep apnea (adult) (pediatric): Secondary | ICD-10-CM | POA: Diagnosis not present

## 2023-08-31 DIAGNOSIS — E1165 Type 2 diabetes mellitus with hyperglycemia: Secondary | ICD-10-CM | POA: Diagnosis not present

## 2023-08-31 DIAGNOSIS — M549 Dorsalgia, unspecified: Secondary | ICD-10-CM | POA: Diagnosis not present

## 2023-08-31 DIAGNOSIS — Z299 Encounter for prophylactic measures, unspecified: Secondary | ICD-10-CM | POA: Diagnosis not present

## 2023-08-31 DIAGNOSIS — J449 Chronic obstructive pulmonary disease, unspecified: Secondary | ICD-10-CM | POA: Diagnosis not present

## 2023-08-31 DIAGNOSIS — I10 Essential (primary) hypertension: Secondary | ICD-10-CM | POA: Diagnosis not present

## 2023-09-08 DIAGNOSIS — M51369 Other intervertebral disc degeneration, lumbar region without mention of lumbar back pain or lower extremity pain: Secondary | ICD-10-CM | POA: Diagnosis not present

## 2023-09-08 DIAGNOSIS — M4800 Spinal stenosis, site unspecified: Secondary | ICD-10-CM | POA: Diagnosis not present

## 2023-09-08 DIAGNOSIS — J449 Chronic obstructive pulmonary disease, unspecified: Secondary | ICD-10-CM | POA: Diagnosis not present

## 2023-09-08 DIAGNOSIS — R062 Wheezing: Secondary | ICD-10-CM | POA: Diagnosis not present

## 2023-09-16 DIAGNOSIS — G4733 Obstructive sleep apnea (adult) (pediatric): Secondary | ICD-10-CM | POA: Diagnosis not present

## 2023-09-16 DIAGNOSIS — G4731 Primary central sleep apnea: Secondary | ICD-10-CM | POA: Diagnosis not present

## 2023-09-16 DIAGNOSIS — J449 Chronic obstructive pulmonary disease, unspecified: Secondary | ICD-10-CM | POA: Diagnosis not present

## 2023-10-09 DIAGNOSIS — M51369 Other intervertebral disc degeneration, lumbar region without mention of lumbar back pain or lower extremity pain: Secondary | ICD-10-CM | POA: Diagnosis not present

## 2023-10-09 DIAGNOSIS — M4800 Spinal stenosis, site unspecified: Secondary | ICD-10-CM | POA: Diagnosis not present

## 2023-10-09 DIAGNOSIS — R062 Wheezing: Secondary | ICD-10-CM | POA: Diagnosis not present

## 2023-10-09 DIAGNOSIS — J449 Chronic obstructive pulmonary disease, unspecified: Secondary | ICD-10-CM | POA: Diagnosis not present

## 2023-10-25 DIAGNOSIS — I10 Essential (primary) hypertension: Secondary | ICD-10-CM | POA: Diagnosis not present

## 2023-10-25 DIAGNOSIS — J449 Chronic obstructive pulmonary disease, unspecified: Secondary | ICD-10-CM | POA: Diagnosis not present

## 2023-10-25 DIAGNOSIS — G4733 Obstructive sleep apnea (adult) (pediatric): Secondary | ICD-10-CM | POA: Diagnosis not present

## 2023-10-30 DIAGNOSIS — G4733 Obstructive sleep apnea (adult) (pediatric): Secondary | ICD-10-CM | POA: Diagnosis not present

## 2023-10-30 DIAGNOSIS — J449 Chronic obstructive pulmonary disease, unspecified: Secondary | ICD-10-CM | POA: Diagnosis not present

## 2023-11-25 DIAGNOSIS — G4733 Obstructive sleep apnea (adult) (pediatric): Secondary | ICD-10-CM | POA: Diagnosis not present

## 2023-11-25 DIAGNOSIS — J449 Chronic obstructive pulmonary disease, unspecified: Secondary | ICD-10-CM | POA: Diagnosis not present

## 2023-11-25 DIAGNOSIS — I10 Essential (primary) hypertension: Secondary | ICD-10-CM | POA: Diagnosis not present

## 2023-12-15 DIAGNOSIS — G4733 Obstructive sleep apnea (adult) (pediatric): Secondary | ICD-10-CM | POA: Diagnosis not present

## 2023-12-15 DIAGNOSIS — J449 Chronic obstructive pulmonary disease, unspecified: Secondary | ICD-10-CM | POA: Diagnosis not present

## 2023-12-16 DIAGNOSIS — J449 Chronic obstructive pulmonary disease, unspecified: Secondary | ICD-10-CM | POA: Diagnosis not present

## 2023-12-16 DIAGNOSIS — G4731 Primary central sleep apnea: Secondary | ICD-10-CM | POA: Diagnosis not present

## 2023-12-23 DIAGNOSIS — I10 Essential (primary) hypertension: Secondary | ICD-10-CM | POA: Diagnosis not present

## 2023-12-23 DIAGNOSIS — J449 Chronic obstructive pulmonary disease, unspecified: Secondary | ICD-10-CM | POA: Diagnosis not present

## 2023-12-23 DIAGNOSIS — G4733 Obstructive sleep apnea (adult) (pediatric): Secondary | ICD-10-CM | POA: Diagnosis not present

## 2024-01-23 DIAGNOSIS — J449 Chronic obstructive pulmonary disease, unspecified: Secondary | ICD-10-CM | POA: Diagnosis not present

## 2024-01-23 DIAGNOSIS — G4733 Obstructive sleep apnea (adult) (pediatric): Secondary | ICD-10-CM | POA: Diagnosis not present

## 2024-01-23 DIAGNOSIS — I10 Essential (primary) hypertension: Secondary | ICD-10-CM | POA: Diagnosis not present

## 2024-01-27 DIAGNOSIS — J449 Chronic obstructive pulmonary disease, unspecified: Secondary | ICD-10-CM | POA: Diagnosis not present

## 2024-01-27 DIAGNOSIS — G4731 Primary central sleep apnea: Secondary | ICD-10-CM | POA: Diagnosis not present

## 2024-02-01 DIAGNOSIS — Z299 Encounter for prophylactic measures, unspecified: Secondary | ICD-10-CM | POA: Diagnosis not present

## 2024-02-01 DIAGNOSIS — E1169 Type 2 diabetes mellitus with other specified complication: Secondary | ICD-10-CM | POA: Diagnosis not present

## 2024-02-01 DIAGNOSIS — J449 Chronic obstructive pulmonary disease, unspecified: Secondary | ICD-10-CM | POA: Diagnosis not present

## 2024-02-01 DIAGNOSIS — J9611 Chronic respiratory failure with hypoxia: Secondary | ICD-10-CM | POA: Diagnosis not present

## 2024-02-01 DIAGNOSIS — M4800 Spinal stenosis, site unspecified: Secondary | ICD-10-CM | POA: Diagnosis not present

## 2024-02-01 DIAGNOSIS — I700 Atherosclerosis of aorta: Secondary | ICD-10-CM | POA: Diagnosis not present

## 2024-02-01 DIAGNOSIS — E1140 Type 2 diabetes mellitus with diabetic neuropathy, unspecified: Secondary | ICD-10-CM | POA: Diagnosis not present

## 2024-02-22 DIAGNOSIS — I10 Essential (primary) hypertension: Secondary | ICD-10-CM | POA: Diagnosis not present

## 2024-02-22 DIAGNOSIS — J449 Chronic obstructive pulmonary disease, unspecified: Secondary | ICD-10-CM | POA: Diagnosis not present

## 2024-02-22 DIAGNOSIS — G4733 Obstructive sleep apnea (adult) (pediatric): Secondary | ICD-10-CM | POA: Diagnosis not present

## 2024-03-15 DIAGNOSIS — G4731 Primary central sleep apnea: Secondary | ICD-10-CM | POA: Diagnosis not present

## 2024-03-15 DIAGNOSIS — J449 Chronic obstructive pulmonary disease, unspecified: Secondary | ICD-10-CM | POA: Diagnosis not present

## 2024-03-17 DIAGNOSIS — G4731 Primary central sleep apnea: Secondary | ICD-10-CM | POA: Diagnosis not present

## 2024-03-17 DIAGNOSIS — J449 Chronic obstructive pulmonary disease, unspecified: Secondary | ICD-10-CM | POA: Diagnosis not present

## 2024-03-24 DIAGNOSIS — G4733 Obstructive sleep apnea (adult) (pediatric): Secondary | ICD-10-CM | POA: Diagnosis not present

## 2024-03-24 DIAGNOSIS — I10 Essential (primary) hypertension: Secondary | ICD-10-CM | POA: Diagnosis not present

## 2024-03-24 DIAGNOSIS — J449 Chronic obstructive pulmonary disease, unspecified: Secondary | ICD-10-CM | POA: Diagnosis not present

## 2024-04-23 DIAGNOSIS — G4733 Obstructive sleep apnea (adult) (pediatric): Secondary | ICD-10-CM | POA: Diagnosis not present

## 2024-04-23 DIAGNOSIS — I10 Essential (primary) hypertension: Secondary | ICD-10-CM | POA: Diagnosis not present

## 2024-04-23 DIAGNOSIS — J449 Chronic obstructive pulmonary disease, unspecified: Secondary | ICD-10-CM | POA: Diagnosis not present

## 2024-04-28 DIAGNOSIS — G4731 Primary central sleep apnea: Secondary | ICD-10-CM | POA: Diagnosis not present

## 2024-04-28 DIAGNOSIS — J449 Chronic obstructive pulmonary disease, unspecified: Secondary | ICD-10-CM | POA: Diagnosis not present

## 2024-05-19 DIAGNOSIS — Z7189 Other specified counseling: Secondary | ICD-10-CM | POA: Diagnosis not present

## 2024-05-19 DIAGNOSIS — E039 Hypothyroidism, unspecified: Secondary | ICD-10-CM | POA: Diagnosis not present

## 2024-05-19 DIAGNOSIS — Z0000 Encounter for general adult medical examination without abnormal findings: Secondary | ICD-10-CM | POA: Diagnosis not present

## 2024-05-19 DIAGNOSIS — F17210 Nicotine dependence, cigarettes, uncomplicated: Secondary | ICD-10-CM | POA: Diagnosis not present

## 2024-05-19 DIAGNOSIS — I10 Essential (primary) hypertension: Secondary | ICD-10-CM | POA: Diagnosis not present

## 2024-05-19 DIAGNOSIS — E119 Type 2 diabetes mellitus without complications: Secondary | ICD-10-CM | POA: Diagnosis not present

## 2024-05-19 DIAGNOSIS — R5383 Other fatigue: Secondary | ICD-10-CM | POA: Diagnosis not present

## 2024-05-19 DIAGNOSIS — E785 Hyperlipidemia, unspecified: Secondary | ICD-10-CM | POA: Diagnosis not present

## 2024-05-19 DIAGNOSIS — Z299 Encounter for prophylactic measures, unspecified: Secondary | ICD-10-CM | POA: Diagnosis not present

## 2024-05-19 DIAGNOSIS — Z79899 Other long term (current) drug therapy: Secondary | ICD-10-CM | POA: Diagnosis not present

## 2024-05-24 DIAGNOSIS — I10 Essential (primary) hypertension: Secondary | ICD-10-CM | POA: Diagnosis not present

## 2024-05-24 DIAGNOSIS — J449 Chronic obstructive pulmonary disease, unspecified: Secondary | ICD-10-CM | POA: Diagnosis not present

## 2024-05-24 DIAGNOSIS — G4733 Obstructive sleep apnea (adult) (pediatric): Secondary | ICD-10-CM | POA: Diagnosis not present

## 2024-06-05 DIAGNOSIS — I10 Essential (primary) hypertension: Secondary | ICD-10-CM | POA: Diagnosis not present

## 2024-06-05 DIAGNOSIS — Z0000 Encounter for general adult medical examination without abnormal findings: Secondary | ICD-10-CM | POA: Diagnosis not present

## 2024-06-05 DIAGNOSIS — Z299 Encounter for prophylactic measures, unspecified: Secondary | ICD-10-CM | POA: Diagnosis not present

## 2024-06-05 DIAGNOSIS — E1165 Type 2 diabetes mellitus with hyperglycemia: Secondary | ICD-10-CM | POA: Diagnosis not present

## 2024-06-14 DIAGNOSIS — J449 Chronic obstructive pulmonary disease, unspecified: Secondary | ICD-10-CM | POA: Diagnosis not present

## 2024-06-14 DIAGNOSIS — G4731 Primary central sleep apnea: Secondary | ICD-10-CM | POA: Diagnosis not present

## 2024-06-15 DIAGNOSIS — J449 Chronic obstructive pulmonary disease, unspecified: Secondary | ICD-10-CM | POA: Diagnosis not present

## 2024-06-24 DIAGNOSIS — I10 Essential (primary) hypertension: Secondary | ICD-10-CM | POA: Diagnosis not present

## 2024-06-24 DIAGNOSIS — G4733 Obstructive sleep apnea (adult) (pediatric): Secondary | ICD-10-CM | POA: Diagnosis not present

## 2024-07-14 DIAGNOSIS — J449 Chronic obstructive pulmonary disease, unspecified: Secondary | ICD-10-CM | POA: Diagnosis not present

## 2024-07-14 DIAGNOSIS — G4731 Primary central sleep apnea: Secondary | ICD-10-CM | POA: Diagnosis not present

## 2024-07-24 DIAGNOSIS — I10 Essential (primary) hypertension: Secondary | ICD-10-CM | POA: Diagnosis not present

## 2024-07-24 DIAGNOSIS — J449 Chronic obstructive pulmonary disease, unspecified: Secondary | ICD-10-CM | POA: Diagnosis not present

## 2024-07-24 DIAGNOSIS — G4733 Obstructive sleep apnea (adult) (pediatric): Secondary | ICD-10-CM | POA: Diagnosis not present

## 2024-07-29 ENCOUNTER — Encounter (HOSPITAL_COMMUNITY): Payer: Self-pay

## 2024-07-29 ENCOUNTER — Emergency Department (HOSPITAL_COMMUNITY)

## 2024-07-29 ENCOUNTER — Other Ambulatory Visit: Payer: Self-pay

## 2024-07-29 ENCOUNTER — Inpatient Hospital Stay (HOSPITAL_COMMUNITY)
Admission: EM | Admit: 2024-07-29 | Discharge: 2024-08-02 | DRG: 193 | Disposition: A | Discharge status: Home / Self Care | Attending: Family Medicine | Admitting: Family Medicine

## 2024-07-29 DIAGNOSIS — I5033 Acute on chronic diastolic (congestive) heart failure: Secondary | ICD-10-CM | POA: Diagnosis present

## 2024-07-29 DIAGNOSIS — J9622 Acute and chronic respiratory failure with hypercapnia: Secondary | ICD-10-CM | POA: Diagnosis not present

## 2024-07-29 DIAGNOSIS — Z91048 Other nonmedicinal substance allergy status: Secondary | ICD-10-CM

## 2024-07-29 DIAGNOSIS — J9621 Acute and chronic respiratory failure with hypoxia: Secondary | ICD-10-CM | POA: Diagnosis present

## 2024-07-29 DIAGNOSIS — J810 Acute pulmonary edema: Secondary | ICD-10-CM | POA: Diagnosis present

## 2024-07-29 DIAGNOSIS — J9601 Acute respiratory failure with hypoxia: Secondary | ICD-10-CM

## 2024-07-29 DIAGNOSIS — E66811 Obesity, class 1: Secondary | ICD-10-CM | POA: Diagnosis present

## 2024-07-29 DIAGNOSIS — K219 Gastro-esophageal reflux disease without esophagitis: Secondary | ICD-10-CM | POA: Diagnosis present

## 2024-07-29 DIAGNOSIS — Z7951 Long term (current) use of inhaled steroids: Secondary | ICD-10-CM | POA: Diagnosis not present

## 2024-07-29 DIAGNOSIS — E782 Mixed hyperlipidemia: Secondary | ICD-10-CM | POA: Diagnosis present

## 2024-07-29 DIAGNOSIS — Z79890 Hormone replacement therapy (postmenopausal): Secondary | ICD-10-CM

## 2024-07-29 DIAGNOSIS — E119 Type 2 diabetes mellitus without complications: Secondary | ICD-10-CM | POA: Diagnosis not present

## 2024-07-29 DIAGNOSIS — Z9981 Dependence on supplemental oxygen: Secondary | ICD-10-CM | POA: Diagnosis not present

## 2024-07-29 DIAGNOSIS — Z720 Tobacco use: Secondary | ICD-10-CM | POA: Diagnosis not present

## 2024-07-29 DIAGNOSIS — J440 Chronic obstructive pulmonary disease with acute lower respiratory infection: Secondary | ICD-10-CM | POA: Diagnosis present

## 2024-07-29 DIAGNOSIS — Z801 Family history of malignant neoplasm of trachea, bronchus and lung: Secondary | ICD-10-CM

## 2024-07-29 DIAGNOSIS — I272 Pulmonary hypertension, unspecified: Secondary | ICD-10-CM | POA: Diagnosis present

## 2024-07-29 DIAGNOSIS — M797 Fibromyalgia: Secondary | ICD-10-CM | POA: Diagnosis present

## 2024-07-29 DIAGNOSIS — Z79899 Other long term (current) drug therapy: Secondary | ICD-10-CM | POA: Diagnosis not present

## 2024-07-29 DIAGNOSIS — G4730 Sleep apnea, unspecified: Secondary | ICD-10-CM | POA: Diagnosis present

## 2024-07-29 DIAGNOSIS — Z90710 Acquired absence of both cervix and uterus: Secondary | ICD-10-CM

## 2024-07-29 DIAGNOSIS — Z6834 Body mass index (BMI) 34.0-34.9, adult: Secondary | ICD-10-CM | POA: Diagnosis not present

## 2024-07-29 DIAGNOSIS — J189 Pneumonia, unspecified organism: Principal | ICD-10-CM | POA: Diagnosis present

## 2024-07-29 DIAGNOSIS — J441 Chronic obstructive pulmonary disease with (acute) exacerbation: Principal | ICD-10-CM | POA: Diagnosis present

## 2024-07-29 DIAGNOSIS — E039 Hypothyroidism, unspecified: Secondary | ICD-10-CM | POA: Diagnosis not present

## 2024-07-29 DIAGNOSIS — F17210 Nicotine dependence, cigarettes, uncomplicated: Secondary | ICD-10-CM | POA: Diagnosis present

## 2024-07-29 DIAGNOSIS — I10 Essential (primary) hypertension: Secondary | ICD-10-CM

## 2024-07-29 DIAGNOSIS — I5031 Acute diastolic (congestive) heart failure: Secondary | ICD-10-CM | POA: Diagnosis not present

## 2024-07-29 DIAGNOSIS — Z981 Arthrodesis status: Secondary | ICD-10-CM

## 2024-07-29 DIAGNOSIS — Z7984 Long term (current) use of oral hypoglycemic drugs: Secondary | ICD-10-CM | POA: Diagnosis not present

## 2024-07-29 DIAGNOSIS — I110 Hypertensive heart disease with heart failure: Secondary | ICD-10-CM | POA: Diagnosis present

## 2024-07-29 DIAGNOSIS — Z1152 Encounter for screening for COVID-19: Secondary | ICD-10-CM

## 2024-07-29 DIAGNOSIS — I5030 Unspecified diastolic (congestive) heart failure: Secondary | ICD-10-CM | POA: Diagnosis not present

## 2024-07-29 LAB — CBC WITH DIFFERENTIAL/PLATELET
Abs Immature Granulocytes: 0.04 K/uL (ref 0.00–0.07)
Basophils Absolute: 0 K/uL (ref 0.0–0.1)
Basophils Relative: 1 %
Eosinophils Absolute: 0.1 K/uL (ref 0.0–0.5)
Eosinophils Relative: 1 %
HCT: 44.5 % (ref 36.0–46.0)
Hemoglobin: 14.2 g/dL (ref 12.0–15.0)
Immature Granulocytes: 1 %
Lymphocytes Relative: 29 %
Lymphs Abs: 2.2 K/uL (ref 0.7–4.0)
MCH: 30.7 pg (ref 26.0–34.0)
MCHC: 31.9 g/dL (ref 30.0–36.0)
MCV: 96.1 fL (ref 80.0–100.0)
Monocytes Absolute: 0.8 K/uL (ref 0.1–1.0)
Monocytes Relative: 10 %
Neutro Abs: 4.6 K/uL (ref 1.7–7.7)
Neutrophils Relative %: 58 %
Platelets: 186 K/uL (ref 150–400)
RBC: 4.63 MIL/uL (ref 3.87–5.11)
RDW: 15.6 % — ABNORMAL HIGH (ref 11.5–15.5)
WBC: 7.7 K/uL (ref 4.0–10.5)
nRBC: 0 % (ref 0.0–0.2)

## 2024-07-29 LAB — URINALYSIS, ROUTINE W REFLEX MICROSCOPIC
Bacteria, UA: NONE SEEN
Bilirubin Urine: NEGATIVE
Glucose, UA: NEGATIVE mg/dL
Hgb urine dipstick: NEGATIVE
Ketones, ur: NEGATIVE mg/dL
Leukocytes,Ua: NEGATIVE
Nitrite: NEGATIVE
Protein, ur: >=300 mg/dL — AB
Specific Gravity, Urine: 1.011 (ref 1.005–1.030)
pH: 5 (ref 5.0–8.0)

## 2024-07-29 LAB — LACTIC ACID, PLASMA: Lactic Acid, Venous: 1.1 mmol/L (ref 0.5–1.9)

## 2024-07-29 LAB — COMPREHENSIVE METABOLIC PANEL WITH GFR
ALT: 10 U/L (ref 0–44)
AST: 17 U/L (ref 15–41)
Albumin: 4.5 g/dL (ref 3.5–5.0)
Alkaline Phosphatase: 84 U/L (ref 38–126)
Anion gap: 9 (ref 5–15)
BUN: 10 mg/dL (ref 8–23)
CO2: 30 mmol/L (ref 22–32)
Calcium: 9.1 mg/dL (ref 8.9–10.3)
Chloride: 99 mmol/L (ref 98–111)
Creatinine, Ser: 0.6 mg/dL (ref 0.44–1.00)
GFR, Estimated: >60 mL/min (ref >60)
Glucose, Bld: 116 mg/dL — ABNORMAL HIGH (ref 70–99)
Potassium: 3.8 mmol/L (ref 3.5–5.1)
Sodium: 138 mmol/L (ref 135–145)
Total Bilirubin: 0.3 mg/dL (ref 0.0–1.2)
Total Protein: 7.7 g/dL (ref 6.5–8.1)

## 2024-07-29 LAB — BLOOD GAS, VENOUS
Acid-Base Excess: 2.4 mmol/L — ABNORMAL HIGH (ref 0.0–2.0)
Bicarbonate: 32.7 mmol/L — ABNORMAL HIGH (ref 20.0–28.0)
Drawn by: 34092
O2 Saturation: 92.3 %
Patient temperature: 36.2
pCO2, Ven: 77 mmHg (ref 44–60)
pH, Ven: 7.23 — ABNORMAL LOW (ref 7.25–7.43)
pO2, Ven: 61 mmHg — ABNORMAL HIGH (ref 32–45)

## 2024-07-29 LAB — RESP PANEL BY RT-PCR (RSV, FLU A&B, COVID)  RVPGX2
Influenza A by PCR: NEGATIVE
Influenza B by PCR: NEGATIVE
Resp Syncytial Virus by PCR: NEGATIVE
SARS Coronavirus 2 by RT PCR: NEGATIVE

## 2024-07-29 LAB — TROPONIN T, HIGH SENSITIVITY: Troponin T High Sensitivity: <15 ng/L (ref 0–19)

## 2024-07-29 LAB — PRO BRAIN NATRIURETIC PEPTIDE: Pro Brain Natriuretic Peptide: 827 pg/mL — ABNORMAL HIGH (ref <300.0)

## 2024-07-29 MED ORDER — DOXYCYCLINE HYCLATE 100 MG PO TABS
100.0000 mg | ORAL_TABLET | Freq: Once | ORAL | Status: AC
Start: 2024-07-29 — End: 2024-07-29
  Administered 2024-07-29: 100 mg via ORAL
  Filled 2024-07-29: qty 1

## 2024-07-29 MED ORDER — INSULIN ASPART 100 UNIT/ML IJ SOLN
0.0000 [IU] | Freq: Three times a day (TID) | INTRAMUSCULAR | Status: DC
  Administered 2024-07-30 – 2024-08-01 (×5): 2 [IU] via SUBCUTANEOUS
  Administered 2024-08-02: 1 [IU] via SUBCUTANEOUS
  Filled 2024-07-29 (×3): qty 1

## 2024-07-29 MED ORDER — PANTOPRAZOLE SODIUM 40 MG PO TBEC
40.0000 mg | DELAYED_RELEASE_TABLET | Freq: Every day | ORAL | Status: DC
Start: 2024-07-29 — End: 2024-08-02
  Administered 2024-07-29 – 2024-08-02 (×5): 40 mg via ORAL
  Filled 2024-07-29 (×5): qty 1

## 2024-07-29 MED ORDER — HYDRALAZINE HCL 25 MG PO TABS
25.0000 mg | ORAL_TABLET | Freq: Two times a day (BID) | ORAL | Status: DC
  Administered 2024-07-29 – 2024-08-02 (×8): 25 mg via ORAL
  Filled 2024-07-29 (×9): qty 1

## 2024-07-29 MED ORDER — ONDANSETRON HCL 4 MG/2ML IJ SOLN: 4.0000 mg | Freq: Four times a day (QID) | INTRAMUSCULAR | Status: DC | PRN

## 2024-07-29 MED ORDER — IPRATROPIUM-ALBUTEROL 0.5-2.5 (3) MG/3ML IN SOLN
3.0000 mL | Freq: Four times a day (QID) | RESPIRATORY_TRACT | Status: DC
  Administered 2024-07-30 – 2024-08-02 (×14): 3 mL via RESPIRATORY_TRACT
  Filled 2024-07-29 (×16): qty 3

## 2024-07-29 MED ORDER — IPRATROPIUM-ALBUTEROL 0.5-2.5 (3) MG/3ML IN SOLN
3.0000 mL | Freq: Once | RESPIRATORY_TRACT | Status: AC
  Administered 2024-07-29: 3 mL via RESPIRATORY_TRACT

## 2024-07-29 MED ORDER — ACETAMINOPHEN 325 MG PO TABS
650.0000 mg | ORAL_TABLET | Freq: Four times a day (QID) | ORAL | Status: DC | PRN
  Administered 2024-07-30: 650 mg via ORAL
  Filled 2024-07-29: qty 2

## 2024-07-29 MED ORDER — LOSARTAN POTASSIUM 50 MG PO TABS
100.0000 mg | ORAL_TABLET | Freq: Every day | ORAL | Status: DC
  Administered 2024-07-30 – 2024-08-02 (×4): 100 mg via ORAL
  Filled 2024-07-29 (×4): qty 2

## 2024-07-29 MED ORDER — NITROGLYCERIN 2 % TD OINT: 1.0000 [in_us] | TOPICAL_OINTMENT | Freq: Once | TRANSDERMAL | Status: DC

## 2024-07-29 MED ORDER — ALBUTEROL SULFATE (2.5 MG/3ML) 0.083% IN NEBU
10.0000 mg/h | INHALATION_SOLUTION | Freq: Once | RESPIRATORY_TRACT | Status: AC
  Administered 2024-07-29: 10 mg/h via RESPIRATORY_TRACT
  Filled 2024-07-29: qty 12
  Filled 2024-07-29: qty 3

## 2024-07-29 MED ORDER — FUROSEMIDE 10 MG/ML IJ SOLN
40.0000 mg | Freq: Two times a day (BID) | INTRAMUSCULAR | Status: DC
  Administered 2024-07-30 – 2024-08-02 (×7): 40 mg via INTRAVENOUS
  Filled 2024-07-29 (×7): qty 4

## 2024-07-29 MED ORDER — ONDANSETRON HCL 4 MG PO TABS: 4.0000 mg | ORAL_TABLET | Freq: Four times a day (QID) | ORAL | Status: DC | PRN

## 2024-07-29 MED ORDER — DM-GUAIFENESIN ER 30-600 MG PO TB12
1.0000 | ORAL_TABLET | Freq: Two times a day (BID) | ORAL | Status: DC
  Administered 2024-07-29 – 2024-08-02 (×8): 1 via ORAL
  Filled 2024-07-29 (×8): qty 1

## 2024-07-29 MED ORDER — FUROSEMIDE 10 MG/ML IJ SOLN
40.0000 mg | Freq: Once | INTRAMUSCULAR | Status: AC
  Administered 2024-07-29: 40 mg via INTRAVENOUS
  Filled 2024-07-29: qty 4

## 2024-07-29 MED ORDER — ACETAMINOPHEN 650 MG RE SUPP: 650.0000 mg | Freq: Four times a day (QID) | RECTAL | Status: DC | PRN

## 2024-07-29 MED ORDER — SODIUM CHLORIDE 0.9 % IV SOLN
2.0000 g | INTRAVENOUS | Status: DC
  Administered 2024-07-30 – 2024-08-01 (×3): 2 g via INTRAVENOUS
  Filled 2024-07-29 (×3): qty 20

## 2024-07-29 MED ORDER — SODIUM CHLORIDE 0.9 % IV SOLN
1.0000 g | Freq: Once | INTRAVENOUS | Status: AC
  Administered 2024-07-29: 1 g via INTRAVENOUS
  Filled 2024-07-29: qty 10

## 2024-07-29 MED ORDER — IPRATROPIUM-ALBUTEROL 0.5-2.5 (3) MG/3ML IN SOLN
3.0000 mL | RESPIRATORY_TRACT | Status: DC | PRN
Start: 2024-07-29 — End: 2024-08-02
  Administered 2024-07-29: 3 mL via RESPIRATORY_TRACT

## 2024-07-29 MED ORDER — SODIUM CHLORIDE 0.9 % IV SOLN
500.0000 mg | INTRAVENOUS | Status: DC
  Administered 2024-07-30: 500 mg via INTRAVENOUS
  Filled 2024-07-29: qty 5

## 2024-07-29 MED ORDER — EZETIMIBE 10 MG PO TABS
10.0000 mg | ORAL_TABLET | Freq: Every day | ORAL | Status: DC
  Administered 2024-07-30 – 2024-08-02 (×4): 10 mg via ORAL
  Filled 2024-07-29 (×4): qty 1

## 2024-07-29 MED ORDER — METHYLPREDNISOLONE SODIUM SUCC 40 MG IJ SOLR
40.0000 mg | Freq: Two times a day (BID) | INTRAMUSCULAR | Status: DC
  Administered 2024-07-30 – 2024-08-02 (×7): 40 mg via INTRAVENOUS
  Filled 2024-07-29 (×7): qty 1

## 2024-07-29 MED ORDER — LEVOTHYROXINE SODIUM 125 MCG PO TABS
125.0000 ug | ORAL_TABLET | Freq: Every day | ORAL | Status: DC
  Administered 2024-07-30 – 2024-08-02 (×4): 125 ug via ORAL
  Filled 2024-07-29 (×4): qty 1

## 2024-07-29 MED ORDER — ATORVASTATIN CALCIUM 40 MG PO TABS
40.0000 mg | ORAL_TABLET | Freq: Every day | ORAL | Status: DC
  Administered 2024-07-30 – 2024-08-02 (×4): 40 mg via ORAL
  Filled 2024-07-29 (×4): qty 1

## 2024-07-29 MED ORDER — METHYLPREDNISOLONE SODIUM SUCC 125 MG IJ SOLR: 125.0000 mg | Freq: Once | INTRAMUSCULAR | Status: DC

## 2024-07-29 MED ORDER — IPRATROPIUM-ALBUTEROL 0.5-2.5 (3) MG/3ML IN SOLN
3.0000 mL | Freq: Once | RESPIRATORY_TRACT | Status: AC
  Administered 2024-07-29: 3 mL via RESPIRATORY_TRACT
  Filled 2024-07-29: qty 6

## 2024-07-29 MED ORDER — ENOXAPARIN SODIUM 40 MG/0.4ML IJ SOSY
40.0000 mg | PREFILLED_SYRINGE | Freq: Every day | INTRAMUSCULAR | Status: DC
  Administered 2024-07-29 – 2024-07-31 (×3): 40 mg via SUBCUTANEOUS
  Filled 2024-07-29 (×3): qty 0.4

## 2024-07-29 NOTE — ED Provider Notes (Signed)
 [place] EMERGENCY DEPARTMENT AT [facility] Provider Note   CSN: [id number] Arrival date & time: 07/29/24  1718     Patient presents with: Respiratory Distress   Patricia Stein is a 78 y.o. female.   Patient is a 78 year old female who presents to the emergency department with a chief complaint of shortness of breath, cough, wheezing, runny nose, generalized malaise and fatigue which has been ongoing since yesterday.  She does have a history of COPD and does wear 3 L nasal cannula at all times.  Patient denies any associated chest pain, abdominal pain, nausea, vomiting, diarrhea.  She has had no fever or chills.        Prior to Admission medications   Medication Sig Start Date End Date Taking? Authorizing Provider  albuterol  (PROVENTIL  HFA;VENTOLIN  HFA) 108 (90 BASE) MCG/ACT inhaler Inhale 1 puff into the lungs every 6 (six) hours as needed for wheezing or shortness of breath.     [provider]  amLODipine  (NORVASC ) 10 MG tablet Take 10 mg by mouth daily.    [provider]  atorvastatin  (LIPITOR) 40 MG tablet Take 40 mg by mouth daily. 07/02/24   [provider]  budesonide-formoterol (SYMBICORT) 160-4.5 MCG/ACT inhaler Inhale 1 puff into the lungs 2 (two) times daily.    [provider]  clonazePAM  (KLONOPIN ) 0.5 MG tablet Take 0.5 mg by mouth 2 (two) times daily. Patient takes at night regularly, not always in the am.    [provider]  diazepam  (VALIUM ) 5 MG tablet Take 5 mg by mouth 2 (two) times daily as needed. 07/05/24   [provider]  DULoxetine  (CYMBALTA ) 60 MG capsule Take 60 mg by mouth 2 (two) times daily.     [provider]  ezetimibe (ZETIA) 10 MG tablet Take 10 mg by mouth daily.    [provider]  furosemide  (LASIX ) 40 MG tablet Take 40 mg by mouth every morning.      [provider]  hydrALAZINE (APRESOLINE) 25 MG tablet Take 25 mg by mouth 2 (two) times daily.  07/02/24   [provider]  levothyroxine  (SYNTHROID ) 125 MCG tablet Take 125 mcg by mouth daily. 07/02/24   [provider]  losartan  (COZAAR ) 100 MG tablet Take 100 mg by mouth daily.    [provider]  metFORMIN  (GLUCOPHAGE ) 1000 MG tablet Take 1,000 mg by mouth 2 (two) times daily. 07/02/24   [provider]  metoprolol  succinate (TOPROL -XL) 100 MG 24 hr tablet Take 100 mg by mouth daily. Take with or immediately following a meal.    [provider]  omeprazole (PRILOSEC) 20 MG capsule Take 20 mg by mouth 2 (two) times daily before a meal.    [provider]  OXYGEN  Inhale into the lungs.    [provider]  polyethylene glycol (MIRALAX  / GLYCOLAX ) packet Take 17 g by mouth daily as needed. For constipation    [provider]  potassium chloride  (K-DUR) 10 MEQ tablet Take 10 mEq by mouth every morning.      [provider]  pregabalin  (LYRICA ) 150 MG capsule Take 150 mg by mouth 2 (two) times daily.      [provider]  traZODone  (DESYREL ) 50 MG tablet Take 50 mg by mouth at bedtime.    [provider]    Allergies: Tape    Review of Systems  Respiratory:  Positive for cough, shortness of breath and wheezing.   All other systems  reviewed and are negative.   Updated Vital Signs BP (!) 180/78   Pulse 92   Temp (!) 97.1 F (36.2 C) (Oral)   Resp 16   Ht 5' 1 (1.549 m)   Wt 83.6 kg   SpO2 96%   BMI 34.81 kg/m   Physical Exam Vitals and nursing note reviewed.  Constitutional:      General: She is not in acute distress.    Appearance: Normal appearance. She is not ill-appearing.  HENT:     Head: Normocephalic and atraumatic.     Nose: Nose normal.     Mouth/Throat:     Mouth: Mucous membranes are moist.  Eyes:     Extraocular Movements: Extraocular movements intact.     Conjunctiva/sclera: Conjunctivae normal.     Pupils: Pupils are equal, round, and reactive to light.   Cardiovascular:     Rate and Rhythm: Normal rate and regular rhythm.     Pulses: Normal pulses.     Heart sounds: Normal heart sounds. No murmur heard.    No gallop.  Pulmonary:     Effort: Respiratory distress present.     Breath sounds: No stridor. Wheezing present. No rhonchi or rales.  Abdominal:     General: Abdomen is flat. Bowel sounds are normal. There is no distension.     Palpations: Abdomen is soft.     Tenderness: There is no abdominal tenderness. There is no guarding.  Musculoskeletal:        General: Normal range of motion.     Cervical back: Normal range of motion and neck supple. No rigidity or tenderness.  Skin:    General: Skin is warm and dry.     Findings: No bruising or rash.  Neurological:     General: No focal deficit present.     Mental Status: She is alert and oriented to person, place, and time. Mental status is at baseline.     Cranial Nerves: No cranial nerve deficit.     Sensory: No sensory deficit.     Motor: No weakness.     Coordination: Coordination normal.  Psychiatric:        Mood and Affect: Mood normal.        Behavior: Behavior normal.        Thought Content: Thought content normal.        Judgment: Judgment normal.     (all labs ordered are listed, but only abnormal results are displayed) Labs Reviewed  COMPREHENSIVE METABOLIC PANEL WITH GFR - Abnormal; Notable for the following components:      Result Value   Glucose, Bld 116 (*)    All other components within normal limits  CBC WITH DIFFERENTIAL/PLATELET - Abnormal; Notable for the following components:   RDW 15.6 (*)    All other components within normal limits  BLOOD GAS, VENOUS - Abnormal; Notable for the following components:   pH, Ven 7.23 (*)    pCO2, Ven 77 (*)    pO2, Ven 61 (*)    Bicarbonate 32.7 (*)    Acid-Base Excess 2.4 (*)    All other components within normal limits  CULTURE, BLOOD (ROUTINE X 2)  CULTURE, BLOOD (ROUTINE X 2)  RESP PANEL BY RT-PCR (RSV, FLU  A&B, COVID)  RVPGX2  LACTIC ACID, PLASMA  URINALYSIS, ROUTINE W REFLEX MICROSCOPIC  TROPONIN T, HIGH SENSITIVITY    EKG: None  Radiology: No results found.   .Critical Care  Performed by: Daralene Lonni BIRCH, PA-C Authorized  by: Daralene Lonni BIRCH, PA-C   Critical care provider statement:    Critical care time (minutes):  35   Critical care was necessary to treat or prevent imminent or life-threatening deterioration of the following conditions:  Respiratory failure   Critical care was time spent personally by me on the following activities:  Development of treatment plan with patient or surrogate, discussions with consultants, evaluation of patient's response to treatment, examination of patient, ordering and review of laboratory studies, ordering and review of radiographic studies, ordering and performing treatments and interventions, pulse oximetry, re-evaluation of patient's condition and review of old charts   I assumed direction of critical care for this patient from another provider in my specialty: no     Care discussed with: admitting provider      Medications Ordered in the ED  ipratropium-albuterol  (DUONEB) 0.5-2.5 (3) MG/3ML nebulizer solution 3 mL (has no administration in time range)  ipratropium-albuterol  (DUONEB) 0.5-2.5 (3) MG/3ML nebulizer solution 3 mL (has no administration in time range)  albuterol  (PROVENTIL ) (2.5 MG/3ML) 0.083% nebulizer solution (10 mg/hr Nebulization Given 07/29/24 1742)                                    Medical Decision Making Amount and/or Complexity of Data Reviewed Labs: ordered. Radiology: ordered.  Risk Prescription drug management. Decision regarding hospitalization.   This patient presents to the ED for concern of cough, congestion, dyspnea, wheezing, this involves an extensive number of treatment options, and is a complaint that carries with it a high risk of complications and morbidity.  The differential diagnosis  includes CHF, COPD, pneumonia, ACS, pulmonary embolus, pericarditis, myocarditis, acute viral syndrome   Co morbidities that complicate the patient evaluation  COPD   Additional history obtained:  Additional history obtained from medical records External records from outside source obtained and reviewed including medical records   Lab Tests:  I Ordered, and personally interpreted labs.  The pertinent results include: No leukocytosis, no anemia, normal kidney function liver function, normal electrolytes, negative lactic acid, unremarkable urinalysis, VBG with acidosis and hypercarbia, negative viral swab, negative troponin, elevated BNP   Imaging Studies ordered:  I ordered imaging studies including chest x-ray I independently visualized and interpreted imaging which showed pulmonary edema versus infiltrate I agree with the radiologist interpretation   Cardiac Monitoring: / EKG:  The patient was maintained on a cardiac monitor.  I personally viewed and interpreted the cardiac monitored which showed an underlying rhythm of: Normal sinus rhythm, no ST/T wave changes, no ischemic changes, no STEMI   Consultations Obtained:  I requested consultation with the hospitalist,  and discussed lab and imaging findings as well as pertinent plan - they recommend: Admission   Problem List / ED Course / Critical interventions / Medication management  Patient is doing well at this time and does remain stable.  Symptoms have improved since she has been placed on BiPAP and she is mentating well.  She has been treated for COPD exacerbation as well as a possible acute CHF.  She was given dose of antibiotics is unclear whether this is infiltrate versus pulmonary edema on chest x-ray.  She has no associated fever or leukocytosis at this point but do suspect that this may be pulmonary edema.  Blood pressure has remained stable.  Have discussed patient case with Dr. Adefeso with the hospitalist who has  accepted for admission. I ordered medication including Rocephin ,  doxycycline, Lasix , DuoNeb, albuterol  for COPD, CHF, possible infiltrate Reevaluation of the patient after these medicines showed that the patient improved I have reviewed the patients home medicines and have made adjustments as needed   Social Determinants of Health:  None   Test / Admission - Considered:  Admission     Final diagnoses:  None    ED Discharge Orders     None          Daralene Lonni JONETTA DEVONNA 07/29/24 2208    Suzette Pac, MD 07/30/24 [phone]

## 2024-07-29 NOTE — H&P (Addendum)
 History and Physical    Patient: Patricia Stein FMW:[id number] DOB: 1946/08/07 DOA: 07/29/2024 DOS: the patient was seen and examined on 07/29/2024 PCP: Maree Isles, MD  Patient coming from: Home  Chief Complaint:  Chief Complaint  Patient presents with   Respiratory Distress   HPI: Patricia Stein is a 78 y.o. female with medical history significant of COPD on supplemental oxygen  via [place] at 3 LPM, hypertension, hyperlipidemia, HFpEF who presents to the emergency department from home via EMS due to 1 day onset of shortness of breath, wheezing, chest tightness, cough with production of green phlegm.  She continued to have shortness of breath despite being compliant with home oxygen .  Home breathing treatment only provided minimal relief.  She denies fever, chills, nausea, vomiting, abdominal pain, increased leg swelling.  She smokes occasionally with her last cigarette smoking being yesterday (about 2-3 cigarettes).   ED course In the emergency department, temperature was 97.1 F, BP was 180/78, O2 sat was 96% on 6 LPM of oxygen  via [place].  VBG done showed pH of 7.23 and pCO2 77, bicarb 38.7, O2 sat 92.3%, patient was then transition to BiPAP due to hypercapnia.  Workup in the ED showed normal CBC.  BMP was normal except for blood glucose of 160.  Troponin was less than 15, lactic acid 1.1, proBNP 827.  Influenza A, B, SARS-CoV-2, RSV was negative.  Blood culture pending. Chest x-ray showed vascular congestion and bilateral perihilar/lower lobe airspace opacities, suspicious for pulmonary edema versus infection. Cardiomegaly. EKG personally reviewed showed normal sinus rhythm at rate of 92 bpm Patient was treated with IV ceftriaxone  and doxycycline for presumed community-acquired pneumonia, IV Lasix  40 mg x 1 was given due to suspicious pulmonary edema and breathing treatment was provided for COPD.   Review of Systems: As mentioned in the history of present illness. All other systems reviewed and  are negative. Past Medical History:  Diagnosis Date   Anxiety    Asthma    Childhood   Cataracts, both eyes    each eye has had catarcts removed   Complication of anesthesia    pt reports with ~ 2010 they had trouble with her BP and getting it up she went to recovery room with welps on her unsure  what cause reaction   COPD (chronic obstructive pulmonary disease) (HCC)    Diabetes mellitus    Fibromyalgia    GERD (gastroesophageal reflux disease)    Heart murmur    small per pt see [facility] cardiolgist last in 2011 (trace MR)   Hypercholesteremia    pt reports that her levels are really high   Hypertension    stress test done in 2011, last seen heart MD in 2011   Hypertriglyceridemia    Hypothyroidism    Shortness of breath    Sleep apnea    uses CPAP with oxygen  sleep study done ~ 2 years ago in [place] at [facility]   Past Surgical History:  Procedure Laterality Date   ABDOMINAL HYSTERECTOMY     ANTERIOR CERVICAL DECOMP/DISCECTOMY FUSION N/A 12/14/2014   Procedure: CERICAL FIVE-CERVICAL SIX, CERVICAL SIX-CERVICAL SEVEN ANTERIOR CERVICAL DECOMPRESSION/DISCECTOMY FUSION 2 LEVELS;  Surgeon: Victory Gunnels, MD;  Location: [facility];  Service: Neurosurgery;  Laterality: N/A;   ANTERIOR LAT LUMBAR FUSION  10/14/2012   Procedure: ANTERIOR LATERAL LUMBAR FUSION 1 LEVEL;  Surgeon: Victory DELENA Gunnels, MD;  Location: [facility];  Service: Neurosurgery;  Laterality: Left;   BACK SURGERY  x6   CARPAL TUNNEL RELEASE     TONSILLECTOMY     TUBAL LIGATION     Social History:  reports that she has been smoking cigarettes. She started smoking about 48 years ago. She has a 97.4 pack-year smoking history. She has never used smokeless tobacco. She reports that she does not drink alcohol  and does not use drugs.  Allergies  Allergen Reactions   Tape Rash    Mainly just the clear plastic tape.  Leaves redness and itching    Family History  Problem Relation Age of Onset   Lung cancer Father     Lung cancer Brother    Anesthesia problems Neg Hx    Breast cancer Neg Hx     Prior to Admission medications   Medication Sig Start Date End Date Taking? Authorizing Provider  albuterol  (PROVENTIL  HFA;VENTOLIN  HFA) 108 (90 BASE) MCG/ACT inhaler Inhale 1 puff into the lungs every 6 (six) hours as needed for wheezing or shortness of breath.     [provider]  amLODipine  (NORVASC ) 10 MG tablet Take 10 mg by mouth daily.    [provider]  atorvastatin  (LIPITOR) 40 MG tablet Take 40 mg by mouth daily. 07/02/24   [provider]  budesonide-formoterol (SYMBICORT) 160-4.5 MCG/ACT inhaler Inhale 1 puff into the lungs 2 (two) times daily.    [provider]  clonazePAM  (KLONOPIN ) 0.5 MG tablet Take 0.5 mg by mouth 2 (two) times daily. Patient takes at night regularly, not always in the am.    [provider]  diazepam  (VALIUM ) 5 MG tablet Take 5 mg by mouth 2 (two) times daily as needed. 07/05/24   [provider]  DULoxetine  (CYMBALTA ) 60 MG capsule Take 60 mg by mouth 2 (two) times daily.     [provider]  ezetimibe (ZETIA) 10 MG tablet Take 10 mg by mouth daily.    [provider]  furosemide  (LASIX ) 40 MG tablet Take 40 mg by mouth every morning.      [provider]  hydrALAZINE (APRESOLINE) 25 MG tablet Take 25 mg by mouth 2 (two) times daily. 07/02/24   [provider]  levothyroxine  (SYNTHROID ) 125 MCG tablet Take 125 mcg by mouth daily. 07/02/24   [provider]  losartan  (COZAAR ) 100 MG tablet Take 100 mg by mouth daily.    [provider]  metFORMIN  (GLUCOPHAGE ) 1000 MG tablet Take 1,000 mg by mouth 2 (two) times daily. 07/02/24   [provider]  metoprolol  succinate (TOPROL -XL) 100 MG 24 hr tablet Take 100 mg by mouth daily. Take with or immediately following a meal.    [provider]  omeprazole (PRILOSEC) 20 MG capsule Take 20 mg by mouth 2 (two) times daily  before a meal.    [provider]  OXYGEN  Inhale into the lungs.    [provider]  polyethylene glycol (MIRALAX  / GLYCOLAX ) packet Take 17 g by mouth daily as needed. For constipation    [provider]  potassium chloride  (K-DUR) 10 MEQ tablet Take 10 mEq by mouth every morning.      [provider]  pregabalin  (LYRICA ) 150 MG capsule Take 150 mg by mouth 2 (two) times daily.      [provider]  traZODone  (DESYREL ) 50 MG tablet Take 50 mg by mouth at bedtime.    [provider]    Physical Exam: Vitals:   07/29/24 1900 07/29/24 1915 07/29/24 1930 07/29/24 1945  BP: 139/69  136/71 (!) 151/65 (!) 141/66  Pulse: 85 80 78 80  Resp: (!) 24 (!) 22 (!) 22 (!) 23  Temp:      TempSrc:      SpO2: 97% 95% 92% 93%  Weight:      Height:       General: Elderly female. Awake and alert and oriented x3.  On BiPAP.  Not in any acute distress.  HEENT: NCAT.  PERRLA. EOMI. Sclerae anicteric.  Moist mucosal membranes. Neck: Neck supple without lymphadenopathy. No carotid bruits. No masses palpated.  Cardiovascular: Regular rate with normal S1-S2 sounds. No murmurs, rubs or gallops auscultated. No JVD.  Respiratory: Diffuse expiratory wheezing.  No accessory muscle use. Abdomen: Soft, nontender, nondistended. Active bowel sounds. No masses or hepatosplenomegaly  Skin: No rashes, lesions, or ulcerations.  Dry, warm to touch. Musculoskeletal:  2+ dorsalis pedis and radial pulses. Good ROM.  No contractures  Psychiatric: Intact judgment and insight.  Mood appropriate to current condition. Neurologic: No focal neurological deficits. Strength is 5/5 x 4.  CN II - XII grossly intact.  Data Reviewed:  Assessment and Plan: Acute exacerbation of COPD Acute on chronic respiratory failure with hypoxia and hypercapnia Continue duo nebs, Mucinex, Solu-Medrol , azithromycin . Continue Protonix  to prevent steroid-induced ulcer Continue incentive spirometry and  flutter valve Continue BiPAP with plan to wean patient off of the use and transition to supplemental oxygen  to maintain O2 sat > 92% as tolerated  Elevated proBNP rule out acute on chronic diastolic CHF proBNP 827 (lower than expected for CHF based on patient's age), but chest x-ray showed vascular congestion and bilateral perihilar/lower lobe airspace opacities, suspicious for pulmonary edema versus infection.  Continue total input/output, daily weights and fluid restriction IV Lasix  40 mg x 1 was given in the ED, continue IV Lasix  40 mg twice daily Continue heart healthy diet  Echocardiogram done in March 2016 showed LVEF of 60 to 65%, G2 DD. Echocardiogram done on 07/18/2023 showed LVEF > 55%, G2 DD, mild pulmonary hypertension (Care Everywhere).  Echocardiogram in the morning   CAP POA Chest x-ray was suggestive of pneumonia Patient was started on ceftriaxone  and doxycycline, we shall continue with ceftriaxone  and azithromycin  at this time with plan to de-escalate/discontinue based on blood culture, sputum culture, urine Legionella, strep pneumo and procalcitonin Continue Tylenol  as needed Continue Mucinex, incentive spirometry, flutter valve   Obesity Class I (BMI 34.81) Diet and lifestyle modification  Essential hypertension Continue hydralazine, losartan   Mixed hyperlipidemia Continue Lipitor, Zetia  Acquired hypothyroidism Continue Synthroid   GERD Continue Protonix   Type 2 diabetes mellitus without complication Continue ISS and hypoglycemic protocol Hold metformin  while inpatient Hemoglobin A1c in the morning  Tobacco use Patient states that she smokes cigarettes occasionally with last use being yesterday (2 to 3 cigarettes) She was counseled on tobacco use cessation     Advance Care Planning: Full code  Consults: None  Family Communication: None at bedside  Severity of Illness: The appropriate patient status for this patient is INPATIENT. Inpatient status is  judged to be reasonable and necessary in order to provide the required intensity of service to ensure the patient's safety. The patient's presenting symptoms, physical exam findings, and initial radiographic and laboratory data in the context of their chronic comorbidities is felt to place them at high risk for further clinical deterioration. Furthermore, it is not anticipated that the patient will be medically stable for discharge from the hospital within 2 midnights of admission.   * I certify that at  the point of admission it is my clinical judgment that the patient will require inpatient hospital care spanning beyond 2 midnights from the point of admission due to high intensity of service, high risk for further deterioration and high frequency of surveillance required.*  Author: Casidy Alberta, DO 07/29/2024 8:15 PM  For on call review [email or web address].

## 2024-07-29 NOTE — ED Triage Notes (Signed)
 Pt BIB EMS for [facility] starting yesterday with cough, runny nose and lethargy. Pt did not take her lasix  this morning. Pt does have hx of COPD. Pt upon arrive oxygen  82% on 6L RN placed pt on 15 NRB now at 93&

## 2024-07-30 ENCOUNTER — Inpatient Hospital Stay (HOSPITAL_COMMUNITY)

## 2024-07-30 DIAGNOSIS — J441 Chronic obstructive pulmonary disease with (acute) exacerbation: Secondary | ICD-10-CM | POA: Diagnosis not present

## 2024-07-30 DIAGNOSIS — I5030 Unspecified diastolic (congestive) heart failure: Secondary | ICD-10-CM

## 2024-07-30 LAB — ECHOCARDIOGRAM COMPLETE
AR max vel: 1.68 cm2
AV Area VTI: 1.79 cm2
AV Area mean vel: 1.73 cm2
AV Mean grad: 9.5 mmHg
AV Peak grad: 18.6 mmHg
Ao pk vel: 2.16 m/s
Area-P 1/2: 3.13 cm2
Height: 61 in
S' Lateral: 2.9 cm
Weight: 2867.74 [oz_av]

## 2024-07-30 LAB — CBC
HCT: 39.7 % (ref 36.0–46.0)
Hemoglobin: 12.6 g/dL (ref 12.0–15.0)
MCH: 30.5 pg (ref 26.0–34.0)
MCHC: 31.7 g/dL (ref 30.0–36.0)
MCV: 96.1 fL (ref 80.0–100.0)
Platelets: 152 K/uL (ref 150–400)
RBC: 4.13 MIL/uL (ref 3.87–5.11)
RDW: 15.5 % (ref 11.5–15.5)
WBC: 4.8 K/uL (ref 4.0–10.5)
nRBC: 0 % (ref 0.0–0.2)

## 2024-07-30 LAB — HEMOGLOBIN A1C
Hgb A1c MFr Bld: 5.7 % — ABNORMAL HIGH (ref 4.8–5.6)
Mean Plasma Glucose: 116.89 mg/dL

## 2024-07-30 LAB — COMPREHENSIVE METABOLIC PANEL WITH GFR
ALT: 10 U/L (ref 0–44)
AST: 15 U/L (ref 15–41)
Albumin: 4.3 g/dL (ref 3.5–5.0)
Alkaline Phosphatase: 73 U/L (ref 38–126)
Anion gap: 9 (ref 5–15)
BUN: 9 mg/dL (ref 8–23)
CO2: 32 mmol/L (ref 22–32)
Calcium: 8.7 mg/dL — ABNORMAL LOW (ref 8.9–10.3)
Chloride: 100 mmol/L (ref 98–111)
Creatinine, Ser: 0.66 mg/dL (ref 0.44–1.00)
GFR, Estimated: >60 mL/min (ref >60)
Glucose, Bld: 124 mg/dL — ABNORMAL HIGH (ref 70–99)
Potassium: 3.8 mmol/L (ref 3.5–5.1)
Sodium: 142 mmol/L (ref 135–145)
Total Bilirubin: 0.3 mg/dL (ref 0.0–1.2)
Total Protein: 7.1 g/dL (ref 6.5–8.1)

## 2024-07-30 LAB — GLUCOSE, CAPILLARY
Glucose-Capillary: 108 mg/dL — ABNORMAL HIGH (ref 70–99)
Glucose-Capillary: 161 mg/dL — ABNORMAL HIGH (ref 70–99)
Glucose-Capillary: 117 mg/dL — ABNORMAL HIGH (ref 70–99)
Glucose-Capillary: 129 mg/dL — ABNORMAL HIGH (ref 70–99)

## 2024-07-30 LAB — STREP PNEUMONIAE URINARY ANTIGEN: Strep Pneumo Urinary Antigen: NEGATIVE

## 2024-07-30 LAB — MRSA NEXT GEN BY PCR, NASAL: MRSA by PCR Next Gen: NOT DETECTED

## 2024-07-30 LAB — PROCALCITONIN: Procalcitonin: <0.1 ng/mL

## 2024-07-30 LAB — MAGNESIUM: Magnesium: 2 mg/dL (ref 1.7–2.4)

## 2024-07-30 LAB — PHOSPHORUS: Phosphorus: 4.2 mg/dL (ref 2.5–4.6)

## 2024-07-30 MED ORDER — CHLORHEXIDINE GLUCONATE CLOTH 2 % EX PADS
6.0000 | MEDICATED_PAD | Freq: Every day | CUTANEOUS | Status: DC
  Administered 2024-07-30 – 2024-08-02 (×4): 6 via TOPICAL

## 2024-07-30 MED ORDER — AZITHROMYCIN 250 MG PO TABS
500.0000 mg | ORAL_TABLET | Freq: Every day | ORAL | Status: DC
Start: 2024-07-31 — End: 2024-08-02
  Administered 2024-07-31 – 2024-08-02 (×3): 500 mg via ORAL
  Filled 2024-07-30 (×3): qty 2

## 2024-07-30 MED ORDER — HYDRALAZINE HCL 20 MG/ML IJ SOLN
10.0000 mg | INTRAMUSCULAR | Status: DC | PRN
  Administered 2024-07-30 – 2024-07-31 (×4): 10 mg via INTRAVENOUS
  Filled 2024-07-30 (×4): qty 1

## 2024-07-30 MED ORDER — PREGABALIN 75 MG PO CAPS
150.0000 mg | ORAL_CAPSULE | Freq: Two times a day (BID) | ORAL | Status: DC
  Administered 2024-07-30 – 2024-08-02 (×6): 150 mg via ORAL
  Filled 2024-07-30 (×6): qty 2

## 2024-07-30 MED ORDER — TRAZODONE HCL 50 MG PO TABS
50.0000 mg | ORAL_TABLET | Freq: Every day | ORAL | Status: DC
  Administered 2024-07-30 – 2024-08-01 (×3): 50 mg via ORAL
  Filled 2024-07-30 (×3): qty 1

## 2024-07-30 NOTE — Progress Notes (Signed)
 Requested to go back on bipap

## 2024-07-30 NOTE — Progress Notes (Signed)
 PROGRESS NOTE    Patient: Patricia Stein                            PCP: Maree Isles, MD                    DOB: Jun 09, 1946            DOA: 07/29/2024 FMW:[id number]             DOS: 07/30/2024, 10:58 AM   LOS: 1 day   Date of Service: The patient was seen and examined on 07/30/2024  Subjective:   The patient was seen and examined this morning. Hemodynamically stable. No issues overnight .  Brief Narrative:   Patricia Stein is a 78 y.o. female with medical history significant of COPD on supplemental oxygen  via [place] at 3 LPM, HTN/ HLD, HFpEF who presents to the emergency department from home via EMS due to 1 day onset of shortness of breath, wheezing, chest tightness, cough with production of green phlegm.  She continued to have shortness of breath despite being compliant with home oxygen .  Home breathing treatment only provided minimal relief.  She denies fever, chills, nausea, vomiting, abdominal pain, increased leg swelling.  She smokes occasionally with her last cigarette smoking being yesterday (about 2-3 cigarettes).     ED course:  Tmax97.1 F, BP was 180/78, O2 sat was 96% on 6 LPM of oxygen  via [place].   VBG done showed pH of 7.23 and pCO2 77, bicarb 38.7, O2 sat 92.3%, patient was then transition to BiPAP due to hypercapnia.  Workup in the ED showed normal CBC.  BMP was normal except for blood glucose of 160.  Troponin was less than 15, lactic acid 1.1, proBNP 827.  Influenza A, B, SARS-CoV-2, RSV was negative.  Blood culture pending. Chest x-ray showed vascular congestion and bilateral perihilar/lower lobe airspace opacities, suspicious for pulmonary edema versus infection. Cardiomegaly. EKG personally reviewed showed normal sinus rhythm at rate of 92 bpm  Patient was treated with IV ceftriaxone  and doxycycline for presumed community-acquired pneumonia, IV Lasix  40 mg x 1 was given due to suspicious pulmonary edema and breathing treatment was provided for COPD.    Assessment &  Plan:   Principal Problem:   Acute exacerbation of chronic obstructive pulmonary disease (COPD) (HCC)   Acute exacerbation of COPD -acute on chronic hypoxic respiratory failure -Awake alert oriented, following commands -Continuing respiratory failure, shortness of breath, hypoxia Off BiPAP -Currently on 10 L of oxygen , satting 94%  Continue duo nebs, Mucinex, Solu-Medrol , azithromycin . Continue Protonix  to prevent steroid-induced ulcer Continue incentive spirometry and flutter valve  supplemental oxygen  to maintain O2 sat > 92% as tolerated   Elevated proBNP rule out acute on chronic diastolic CHF - proBNP 827 (lower than expected for CHF based on patient's age),  - chest x-ray showed vascular congestion and bilateral perihilar/lower lobe airspace opacities, suspicious for pulmonary edema versus infection.   -Continue total input/output, daily weights and fluid restriction - IV Lasix  40 mg x 1 was given in the ED, continue IV Lasix  40 mg twice daily - Continue heart healthy diet      - Echocardiogram done in March 2016 showed LVEF of 60 to 65%, G2 DD. - Echocardiogram done on 07/18/2023 showed LVEF > 55%, G2 DD, mild pulmonary hypertension (Care Everywhere).  - 07/30/2024 -  Echocardiogram >>>>    CAP POA Chest x-ray was suggestive of pneumonia -  Continue current antibiotic Rocephin /azithromycin  - Follow-up with blood culture, sputum culture, urine Legionella, strep pneumo and procalcitonin Continue Tylenol  as needed Continue Mucinex, incentive spirometry, flutter valve    Obesity Class I (BMI 34.81) Diet and lifestyle modification   Essential hypertension Continue hydralazine, losartan    Mixed hyperlipidemia Continue Lipitor, Zetia   Acquired hypothyroidism Continue Synthroid    GERD Continue Protonix    Type 2 diabetes mellitus without complication Continue ISS and hypoglycemic protocol Hold metformin  while inpatient Hemoglobin A1c in the morning   Tobacco  use Patient states that she smokes cigarettes occasionally with last use being yesterday (2 to 3 cigarettes) She was counseled on tobacco use cessation      ----------------------------------------------------------------------------------------------------------------------------------------------- Nutritional status:  The patient's BMI is: Body mass index is 33.87 kg/m. I agree with the assessment and plan as outlined ---------------------------------------------------------------------------------------------------------------------------------------------------- Cultures; Blood Cultures x 2 >> Urine Culture  >>>  Sputum Culture >>   ------------------------------------------------------------------------------------------------------------------------------------------------  DVT prophylaxis:  enoxaparin (LOVENOX) injection 40 mg Start: 07/29/24 2200 SCDs Start: 07/29/24 2118   Code Status:   Code Status: Full Code  Family Communication: No family member present at bedside-  -Advance care planning has been discussed.   Admission status:   Status is: Inpatient Remains inpatient appropriate because: Will continue to monitor in ICU setting, on 10 L of oxygen , still satting 94%, as needed respiratory support BiPAP,   Disposition: From  - home             Planning for discharge in 1-2 days   Procedures:   No admission procedures for hospital encounter.   Antimicrobials:  Anti-infectives (From admission, onward)    Start     Dose/Rate Route Frequency Ordered Stop   07/30/24 2000  cefTRIAXone  (ROCEPHIN ) 2 g in sodium chloride  0.9 % 100 mL IVPB        2 g 200 mL/hr over 30 Minutes Intravenous Every 24 hours 07/29/24 2120 08/03/24 1959   07/30/24 0800  azithromycin  (ZITHROMAX ) 500 mg in sodium chloride  0.9 % 250 mL IVPB        500 mg 250 mL/hr over 60 Minutes Intravenous Every 24 hours 07/29/24 2120 08/04/24 0759   07/29/24 1915  cefTRIAXone  (ROCEPHIN ) 1 g in sodium  chloride 0.9 % 100 mL IVPB        1 g 200 mL/hr over 30 Minutes Intravenous  Once 07/29/24 1912 07/29/24 2029   07/29/24 1915  doxycycline (VIBRA-TABS) tablet 100 mg        100 mg Oral  Once 07/29/24 1912 07/29/24 1935        Medication:   atorvastatin   40 mg Oral Daily   Chlorhexidine  Gluconate Cloth  6 each Topical Daily   dextromethorphan-guaiFENesin  1 tablet Oral BID   enoxaparin (LOVENOX) injection  40 mg Subcutaneous QHS   ezetimibe  10 mg Oral Daily   furosemide   40 mg Intravenous Q12H   hydrALAZINE  25 mg Oral BID   insulin  aspart  0-9 Units Subcutaneous TID WC   ipratropium-albuterol   3 mL Nebulization Q6H   levothyroxine   125 mcg Oral Q0600   losartan   100 mg Oral Daily   methylPREDNISolone  (SOLU-MEDROL ) injection  40 mg Intravenous Q12H   pantoprazole   40 mg Oral Daily    acetaminophen  **OR** acetaminophen , ipratropium-albuterol , ondansetron  **OR** ondansetron  (ZOFRAN ) IV   Objective:   Vitals:   07/30/24 0400 07/30/24 0454 07/30/24 0500 07/30/24 0855  BP: (!) 149/67  (!) 125/96   Pulse: 69 77 86   Resp: ROLLEN)  21 19    Temp: 97.7 F (36.5 C)     TempSrc: Axillary     SpO2: 97% 99%  94%  Weight:  81.3 kg    Height:        Intake/Output Summary (Last 24 hours) at 07/30/2024 1058 Last data filed at 07/30/2024 1045 Gross per 24 hour  Intake 340.2 ml  Output 400 ml  Net -59.8 ml   Filed Weights   07/29/24 1740 07/30/24 0454  Weight: 83.6 kg 81.3 kg     Physical examination:   General:  AAO x 3,  cooperative, no distress;   HEENT:  Normocephalic, PERRL, otherwise with in Normal limits   Neuro:  CNII-XII intact. , normal motor and sensation, reflexes intact   Lungs:   Clear to auscultation BL, Respirations unlabored,  No wheezes / crackles  Cardio:    S1/S2, RRR, No murmure, No Rubs or Gallops   Abdomen:  Soft, non-tender, bowel sounds active all four quadrants, no guarding or peritoneal signs.  Muscular  skeletal:  Limited exam -global generalized  weaknesses - in bed, able to move all 4 extremities,   2+ pulses,  symmetric, No pitting edema  Skin:  Dry, warm to touch, negative for any Rashes,  Wounds: Please see nursing documentation       ------------------------------------------------------------------------------------------------------------------------------------------    LABs:     Latest Ref Rng & Units 07/30/2024    3:35 AM 07/29/2024    5:45 PM 11/05/2017    3:50 PM  CBC  WBC 4.0 - 10.5 K/uL 4.8  7.7  5.4   Hemoglobin 12.0 - 15.0 g/dL 87.3  85.7  85.0   Hematocrit 36.0 - 46.0 % 39.7  44.5  42.1   Platelets 150 - 400 K/uL 152  186  221       Latest Ref Rng & Units 07/30/2024    3:35 AM 07/29/2024    5:45 PM 11/05/2017    3:50 PM  CMP  Glucose 70 - 99 mg/dL 875  883  883   BUN 8 - 23 mg/dL 9  10  13    Creatinine 0.44 - 1.00 mg/dL 9.33  9.39  9.39   Sodium 135 - 145 mmol/L 142  138  135   Potassium 3.5 - 5.1 mmol/L 3.8  3.8  4.0   Chloride 98 - 111 mmol/L 100  99  99   CO2 22 - 32 mmol/L 32  30  31   Calcium  8.9 - 10.3 mg/dL 8.7  9.1  9.8   Total Protein 6.5 - 8.1 g/dL 7.1  7.7    Total Bilirubin 0.0 - 1.2 mg/dL 0.3  0.3    Alkaline Phos 38 - 126 U/L 73  84    AST 15 - 41 U/L 15  17    ALT 0 - 44 U/L 10  10         Micro Results Recent Results (from the past 240 hours)  Culture, blood (routine x 2)     Status: None (Preliminary result)   Collection Time: 07/29/24  6:06 PM   Specimen: Right Antecubital; Blood  Result Value Ref Range Status   Specimen Description RIGHT ANTECUBITAL  Final   Special Requests   Final    BOTTLES DRAWN AEROBIC AND ANAEROBIC Blood Culture adequate volume   Culture   Final    NO GROWTH < 24 HOURS Performed at [facility], [address] [place], [place] [address]    Report Status PENDING  Incomplete  Culture, blood (routine x 2)     Status: None (Preliminary result)   Collection Time: 07/29/24  6:13 PM   Specimen: BLOOD RIGHT HAND  Result Value Ref Range Status    Specimen Description BLOOD RIGHT HAND  Final   Special Requests AEROBIC BOTTLE ONLY Blood Culture adequate volume  Final   Culture   Final    NO GROWTH < 24 HOURS Performed at [facility], [address] [place], [place] [address]    Report Status PENDING  Incomplete  Resp panel by RT-PCR (RSV, Flu A&B, Covid) Anterior Nasal Swab     Status: None   Collection Time: 07/29/24  7:19 PM   Specimen: Anterior Nasal Swab  Result Value Ref Range Status   SARS Coronavirus 2 by RT PCR NEGATIVE NEGATIVE Final    Comment: (NOTE) SARS-CoV-2 target nucleic acids are NOT DETECTED.  The SARS-CoV-2 RNA is generally detectable in upper respiratory specimens during the acute phase of infection. The lowest concentration of SARS-CoV-2 viral copies this assay can detect is 138 copies/mL. A negative result does not preclude SARS-Cov-2 infection and should not be used as the sole basis for treatment or other patient management decisions. A negative result may occur with  improper specimen collection/handling, submission of specimen other than nasopharyngeal swab, presence of viral mutation(s) within the areas targeted by this assay, and inadequate number of viral copies(<138 copies/mL). A negative result must be combined with clinical observations, patient history, and epidemiological information. The expected result is Negative.  Fact Sheet for Patients:  bloggercourse.com  Fact Sheet for Healthcare Providers:  seriousbroker.it  This test is no t yet approved or cleared by the [place]  FDA and  has been authorized for detection and/or diagnosis of SARS-CoV-2 by FDA under an Emergency Use Authorization (EUA). This EUA will remain  in effect (meaning this test can be used) for the duration of the COVID-19 declaration under Section 564(b)(1) of the Act, 21 U.S.C.section 360bbb-3(b)(1), unless the authorization is terminated  or revoked sooner.        Influenza A by PCR NEGATIVE NEGATIVE Final   Influenza B by PCR NEGATIVE NEGATIVE Final    Comment: (NOTE) The Xpert Xpress SARS-CoV-2/FLU/RSV plus assay is intended as an aid in the diagnosis of influenza from Nasopharyngeal swab specimens and should not be used as a sole basis for treatment. Nasal washings and aspirates are unacceptable for Xpert Xpress SARS-CoV-2/FLU/RSV testing.  Fact Sheet for Patients: bloggercourse.com  Fact Sheet for Healthcare Providers: seriousbroker.it  This test is not yet approved or cleared by the [place]  FDA and has been authorized for detection and/or diagnosis of SARS-CoV-2 by FDA under an Emergency Use Authorization (EUA). This EUA will remain in effect (meaning this test can be used) for the duration of the COVID-19 declaration under Section 564(b)(1) of the Act, 21 U.S.C. section 360bbb-3(b)(1), unless the authorization is terminated or revoked.     Resp Syncytial Virus by PCR NEGATIVE NEGATIVE Final    Comment: (NOTE) Fact Sheet for Patients: bloggercourse.com  Fact Sheet for Healthcare Providers: seriousbroker.it  This test is not yet approved or cleared by the [place]  FDA and has been authorized for detection and/or diagnosis of SARS-CoV-2 by FDA under an Emergency Use Authorization (EUA). This EUA will remain in effect (meaning this test can be used) for the duration of the COVID-19 declaration under Section 564(b)(1) of the Act, 21 U.S.C. section 360bbb-3(b)(1), unless the authorization is terminated or revoked.  Performed at [facility]  [facility], [address] [place], [place] [address]   MRSA Next Gen by PCR, Nasal     Status: None   Collection Time: 07/29/24 10:04 PM   Specimen: Nasal Mucosa; Nasal Swab  Result Value Ref Range Status   MRSA by PCR Next Gen NOT DETECTED NOT DETECTED Final    Comment: (NOTE) The  GeneXpert MRSA Assay (FDA approved for NASAL specimens only), is one component of a comprehensive MRSA colonization surveillance program. It is not intended to diagnose MRSA infection nor to guide or monitor treatment for MRSA infections. Test performance is not FDA approved in patients less than 71 years old. Performed at [facility], [address], [place], [place] [address]     Radiology Reports DG Chest [place] 1 View Result Date: 07/29/2024 EXAM: 1 VIEW(S) XRAY OF THE CHEST 07/29/2024 06:50:01 PM COMPARISON: 10/11/2012 CLINICAL HISTORY: dyspnea FINDINGS: LUNGS AND PLEURA: Vascular congestion and bilateral perihilar and lower lobe airspace opacities concerning for edema or infection. No pleural effusion. No pneumothorax. HEART AND MEDIASTINUM: Cardiomegaly, aortic atherosclerosis. BONES AND SOFT TISSUES: No acute osseous abnormality. IMPRESSION: 1. Vascular congestion and bilateral perihilar/lower lobe airspace opacities, suspicious for pulmonary edema versus infection. 2. Cardiomegaly. Electronically signed by: Franky Crease MD 07/29/2024 07:08 PM EDT RP Workstation: [id number]    SIGNED: Adriana DELENA Grams, MD, FHM. FAAFP. Jolynn Pack - [facility] Critical care time spent - 55 min.  In seeing, evaluating and examining the patient. Reviewing medical records, labs, drawn plan of care. [facility],  Pager (please use [email or web address] to page/ text) Please use Epic Secure Chat for non-urgent communication (7AM-7PM)  If 7PM-7AM, please contact night-coverage [email or web address], 07/30/2024, 10:58 AM

## 2024-07-30 NOTE — TOC CM/SW Note (Signed)
 Transition of Care [facility]) - Inpatient Brief Assessment   Patient Details  Name: Patricia Stein MRN: [id number] Date of Birth: 1946-08-26  Transition of Care [facility]) CM/SW Contact:    Noreen KATHEE Pinal, LCSWA Phone Number: 07/30/2024, 1:07 PM   Clinical Narrative:  Inpatient Care Management (ICM) has reviewed patient and no other ICM needs have been identified at this time. We will continue to monitor patient advancement through interdisciplinary progression rounds. If new patient transition needs arise, please place a ICM consult.  Transition of Care Asessment: Insurance and Status: Insurance coverage has been reviewed Patient has primary care physician: Yes Home environment has been reviewed: Single Family Home Prior level of function:: Independent Prior/Current Home Services: No current home services Social Drivers of Health Review: SDOH reviewed interventions complete Readmission risk has been reviewed: Yes Transition of care needs: no transition of care needs at this time

## 2024-07-30 NOTE — Discharge Instructions (Signed)
 Rent/Utilities/Housing  Agency: [place]  Clinical Biochemist Address: [address] [address] [place], [place] [address]  [address] [place], [place] [address]  Phone Number: [phone] or [phone] For the hearing-impaired - Dial 711 for Relay [place] Services Agency Name: Raynaldo Haws. Dept. of Health and Human Services Address: [address], [place], [place] [address] Phone: [phone] Website: [email or web address]  Services Offered: Temporary financial assistance, subsidized housing, and utility  assistance  Agency Name: [employer]  Address: [address], [place], [place] [address] Phone: [phone] ext. [phone] Email: Contact: [email or web address]@newrha .org Website: footballpromos.co.nz Services Offered: Subsidized apartment rent based on income.  Agency Name: [facility] Address: [facility], [address] [place], [place] Phone: [phone]  Website: www.ccmeden.org Services Offered: Museum/gallery curator, tefl teacher Technical [employer]er for all of  [facility], [employer], [employer] , [employer]  September 03, 2020 [address] and [place] for [place] area only), rent assistance.  Agency Name: [facility] Address: [address] [place], [place], [place] [address] / [address],  [place] Phone: [phone] [place] / [phone] [place] Website: opiniontrades.tn networkaffair.co.za Services Offered: Civil Service Fast Streamer, food, showers, hygiene products utility payment  assistance, thrift shops, rental assistance, Support Groups Agency Name: [facility]  Address: [address] [place], [place] [address]  Phone: [phone] / [phone] Website: https://www.[facility].org/ Services Offered: Support groups for Bipolar, substance abuse, anger  management, panic depression and anxiety. Mobile crisis unit,  outpatient therapy, substance abuse treatment. Agency Name: [employer]   Address: [address] [place], [place] [address]  Phone: [phone] Website: www.helpinc-centeragainstviolence.org Services Offered: Support groups for domestic violence or sexual assault, support  group for elderly women and domestic assault.  Transportation  Agency Name: [facility] [place] [facility] Address: [address] [place], [place] [address] Phone: [phone] Website: www.adtsrc.org Services Offered: Meals on [employer]. Home care, at home assisted  living, volunteer services, [facility],  [employer] and [employer] transportation system.  [phone] please call and dial ex [phone] ( in county ) [phone] Agency Name: [employer] Address: [address] [place], [place] [address] Phone: [phone] Website: www.[place].com Services Offered: Transportation for a fee.   IMPORTANT INFORMATION: PAY CLOSE ATTENTION   PHYSICIAN DISCHARGE INSTRUCTIONS  Follow with Primary care provider  Maree Isles, MD  and other consultants as instructed by your Hospitalist Physician  SEEK MEDICAL CARE OR RETURN TO EMERGENCY ROOM IF SYMPTOMS COME BACK, WORSEN OR NEW PROBLEM DEVELOPS   Please note: You were cared for by a hospitalist during your hospital stay. Every effort will be made to forward records to your primary care provider.  You can request that your primary care provider send for your hospital records if they have not received them.  Once you are discharged, your primary care physician will handle any further medical issues. Please note that NO REFILLS for any discharge medications will be authorized once you are discharged, as it is imperative that you return to your primary care physician (or establish a relationship with a primary care physician if you do not have one) for your post hospital discharge needs so that they can reassess your need for medications and monitor your lab values.  Please  get a complete blood count and chemistry panel checked by your Primary MD at your next visit, and again as instructed by your Primary MD.  Get Medicines reviewed and adjusted: Please take all your medications with you for your next visit with  your Primary MD  Laboratory/radiological data: Please request your Primary MD to go over all hospital tests and procedure/radiological results at the follow up, please ask your primary care provider to get all Hospital records sent to his/her office.  In some cases, they will be blood work, cultures and biopsy results pending at the time of your discharge. Please request that your primary care provider follow up on these results.  If you are diabetic, please bring your blood sugar readings with you to your follow up appointment with primary care.    Please call and make your follow up appointments as soon as possible.    Also Note the following: If you experience worsening of your admission symptoms, develop shortness of breath, life threatening emergency, suicidal or homicidal thoughts you must seek medical attention immediately by calling 911 or calling your MD immediately  if symptoms less severe.  You must read complete instructions/literature along with all the possible adverse reactions/side effects for all the Medicines you take and that have been prescribed to you. Take any new Medicines after you have completely understood and accpet all the possible adverse reactions/side effects.   Do not drive when taking Pain medications or sleeping medications (Benzodiazepines)  Do not take more than prescribed Pain, Sleep and Anxiety Medications. It is not advisable to combine anxiety,sleep and pain medications without talking with your primary care practitioner  Special Instructions: If you have smoked or chewed Tobacco  in the last 2 yrs please stop smoking, stop any regular Alcohol   and or any Recreational drug use.  Wear Seat belts while driving.  Do  not drive if taking any narcotic, mind altering or controlled substances or recreational drugs or alcohol .

## 2024-07-30 NOTE — Plan of Care (Signed)
  Problem: Education: Goal: Knowledge of General Education information will improve Description: Including pain rating scale, medication(s)/side effects and non-pharmacologic comfort measures Outcome: Progressing   Problem: Clinical Measurements: Goal: Ability to maintain clinical measurements within normal limits will improve Outcome: Progressing Goal: Diagnostic test results will improve Outcome: Progressing   Problem: Nutrition: Goal: Adequate nutrition will be maintained Outcome: Progressing   Problem: Coping: Goal: Level of anxiety will decrease Outcome: Progressing   Problem: Pain Managment: Goal: General experience of comfort will improve and/or be controlled Outcome: Progressing   Problem: Safety: Goal: Ability to remain free from injury will improve Outcome: Progressing   Problem: Skin Integrity: Goal: Risk for impaired skin integrity will decrease Outcome: Progressing   Problem: Skin Integrity: Goal: Risk for impaired skin integrity will decrease Outcome: Progressing   Problem: Tissue Perfusion: Goal: Adequacy of tissue perfusion will improve Outcome: Progressing

## 2024-07-30 NOTE — Hospital Course (Signed)
 Patricia Stein is a 78 y.o. female with medical history significant of COPD on supplemental oxygen  via [place] at 3 LPM, HTN/ HLD, HFpEF who presents to the emergency department from home via EMS due to 1 day onset of shortness of breath, wheezing, chest tightness, cough with production of green phlegm.  She continued to have shortness of breath despite being compliant with home oxygen .  Home breathing treatment only provided minimal relief.  She denies fever, chills, nausea, vomiting, abdominal pain, increased leg swelling.  She smokes occasionally with her last cigarette smoking being yesterday (about 2-3 cigarettes).     ED course:  Tmax97.1 F, BP was 180/78, O2 sat was 96% on 6 LPM of oxygen  via [place].   VBG done showed pH of 7.23 and pCO2 77, bicarb 38.7, O2 sat 92.3%, patient was then transition to BiPAP due to hypercapnia.  Workup in the ED showed normal CBC.  BMP was normal except for blood glucose of 160.  Troponin was less than 15, lactic acid 1.1, proBNP 827.  Influenza A, B, SARS-CoV-2, RSV was negative.  Blood culture pending. Chest x-ray showed vascular congestion and bilateral perihilar/lower lobe airspace opacities, suspicious for pulmonary edema versus infection. Cardiomegaly. EKG personally reviewed showed normal sinus rhythm at rate of 92 bpm  Patient was treated with IV ceftriaxone  and doxycycline for presumed community-acquired pneumonia, IV Lasix  40 mg x 1 was given due to suspicious pulmonary edema and breathing treatment was provided for COPD.

## 2024-07-31 DIAGNOSIS — J441 Chronic obstructive pulmonary disease with (acute) exacerbation: Secondary | ICD-10-CM | POA: Diagnosis not present

## 2024-07-31 LAB — GLUCOSE, CAPILLARY
Glucose-Capillary: 118 mg/dL — ABNORMAL HIGH (ref 70–99)
Glucose-Capillary: 175 mg/dL — ABNORMAL HIGH (ref 70–99)
Glucose-Capillary: 151 mg/dL — ABNORMAL HIGH (ref 70–99)
Glucose-Capillary: 186 mg/dL — ABNORMAL HIGH (ref 70–99)

## 2024-07-31 MED ORDER — NYSTATIN 100000 UNIT/GM EX POWD: Freq: Two times a day (BID) | CUTANEOUS | Status: DC | PRN

## 2024-07-31 MED ORDER — METOPROLOL SUCCINATE ER 50 MG PO TB24
100.0000 mg | ORAL_TABLET | Freq: Every day | ORAL | Status: DC
  Administered 2024-07-31 – 2024-08-02 (×3): 100 mg via ORAL
  Filled 2024-07-31 (×3): qty 2

## 2024-07-31 MED ORDER — AMLODIPINE BESYLATE 5 MG PO TABS
10.0000 mg | ORAL_TABLET | Freq: Every day | ORAL | Status: DC
  Administered 2024-07-31 – 2024-08-02 (×3): 10 mg via ORAL
  Filled 2024-07-31 (×3): qty 2

## 2024-07-31 MED ORDER — HYDROXYZINE HCL 25 MG PO TABS
25.0000 mg | ORAL_TABLET | Freq: Once | ORAL | Status: AC | PRN
  Administered 2024-07-31: 25 mg via ORAL
  Filled 2024-07-31: qty 1

## 2024-07-31 NOTE — Plan of Care (Signed)
  Problem: Clinical Measurements: Goal: Respiratory complications will improve Outcome: Not Progressing Goal: Cardiovascular complication will be avoided Outcome: Progressing   Problem: Pain Managment: Goal: General experience of comfort will improve and/or be controlled Outcome: Progressing   Problem: Skin Integrity: Goal: Risk for impaired skin integrity will decrease Outcome: Progressing

## 2024-07-31 NOTE — Progress Notes (Signed)
 PROGRESS NOTE    Patient: Patricia Stein                            PCP: Maree Isles, MD                    DOB: 1946/03/03            DOA: 07/29/2024 FMW:[id number]             DOS: 07/31/2024, 11:46 AM   LOS: 2 days   Date of Service: The patient was seen and examined on 07/31/2024  Subjective:   The patient was seen and examined this morning.   Awake alert oriented, was on BiPAP overnight currently getting DuoNeb treatment--still complain shortness of breath... Switch to 9 L of oxygen , currently satting 96%   Brief Narrative:   Patricia Stein is a 78 y.o. female with medical history significant of COPD on supplemental oxygen  via [place] at 3 LPM, HTN/ HLD, HFpEF who presents to the emergency department from home via EMS due to 1 day onset of shortness of breath, wheezing, chest tightness, cough with production of green phlegm.  She continued to have shortness of breath despite being compliant with home oxygen .  Home breathing treatment only provided minimal relief.  She denies fever, chills, nausea, vomiting, abdominal pain, increased leg swelling.  She smokes occasionally with her last cigarette smoking being yesterday (about 2-3 cigarettes).     ED course:  Tmax97.1 F, BP was 180/78, O2 sat was 96% on 6 LPM of oxygen  via [place].   VBG done showed pH of 7.23 and pCO2 77, bicarb 38.7, O2 sat 92.3%, patient was then transition to BiPAP due to hypercapnia.  Workup in the ED showed normal CBC.  BMP was normal except for blood glucose of 160.  Troponin was less than 15, lactic acid 1.1, proBNP 827.  Influenza A, B, SARS-CoV-2, RSV was negative.  Blood culture pending. Chest x-ray showed vascular congestion and bilateral perihilar/lower lobe airspace opacities, suspicious for pulmonary edema versus infection. Cardiomegaly. EKG personally reviewed showed normal sinus rhythm at rate of 92 bpm  Patient was treated with IV ceftriaxone  and doxycycline for presumed community-acquired pneumonia, IV  Lasix  40 mg x 1 was given due to suspicious pulmonary edema and breathing treatment was provided for COPD.    Assessment & Plan:   Principal Problem:   Acute exacerbation of chronic obstructive pulmonary disease (COPD) (HCC)   Acute exacerbation of COPD -acute on chronic hypoxic respiratory failure -The patient with awake alert oriented, on 9 L of oxygen , satting 96% -On BiPAP overnight  Continue duo nebs, Mucinex, Solu-Medrol , azithromycin . Continue Protonix  to prevent steroid-induced ulcer Continue incentive spirometry and flutter valve    Elevated proBNP rule out acute on chronic diastolic CHF - proBNP 827 (lower than expected for CHF based on patient's age),  - chest x-ray showed vascular congestion and bilateral perihilar/lower lobe airspace opacities, suspicious for pulmonary edema versus infection.   -Continue total input/output, daily weights and fluid restriction  Intake/Output Summary (Last 24 hours) at 07/31/2024 1150 Last data filed at 07/31/2024 1100 Gross per 24 hour  Intake 930 ml  Output 4150 ml  Net -3220 ml   Filed Weights   07/29/24 1740 07/30/24 0454 07/31/24 0534  Weight: 83.6 kg 81.3 kg 79.6 kg    - Continue with IV Lasix  40 mg p.o. daily  - Continue heart healthy diet      -  Echocardiogram done in March 2016 showed LVEF of 60 to 65%, G2 DD. - Echocardiogram done on 07/18/2023 showed LVEF > 55%, G2 DD, mild pulmonary hypertension (Care Everywhere).  - 07/30/2024 -  Echocardiogram >>>> EF JF 60-65%, grade 1 diastolic dysfunction, negative for any other structural abnormalities   CAP POA Chest x-ray was suggestive of pneumonia -Continue current antibiotic Rocephin /azithromycin  - Follow-up with blood culture, sputum culture, urine Legionella, strep pneumo and procalcitonin Continue Tylenol  as needed Continue Mucinex, incentive spirometry, flutter valve    Obesity Class I (BMI 34.81) Diet and lifestyle modification   Essential  hypertension Continue hydralazine, losartan    Mixed hyperlipidemia Continue Lipitor, Zetia   Acquired hypothyroidism Continue Synthroid    GERD Continue Protonix    Type 2 diabetes mellitus without complication Continue ISS and hypoglycemic protocol Hold metformin  while inpatient Hemoglobin A1c 5.7,   Tobacco use Patient states that she smokes cigarettes occasionally with last use being yesterday (2 to 3 cigarettes) She was counseled on tobacco use cessation      ----------------------------------------------------------------------------------------------------------------------------------------------- Nutritional status:  The patient's BMI is: Body mass index is 33.16 kg/m. I agree with the assessment and plan as outlined ---------------------------------------------------------------------------------------------------------------------------------------------------- Cultures; Blood Cultures x 2 >> Urine Culture  >>>  Sputum Culture >>   ------------------------------------------------------------------------------------------------------------------------------------------------  DVT prophylaxis:  enoxaparin (LOVENOX) injection 40 mg Start: 07/29/24 2200 SCDs Start: 07/29/24 2118   Code Status:   Code Status: Full Code  Family Communication: No family member present at bedside-  -Advance care planning has been discussed.   Admission status:   Status is: Inpatient Remains inpatient appropriate because: Will continue to monitor in ICU setting, on 10 L of oxygen , still satting 94%, as needed respiratory support BiPAP,   Disposition: From  - home             Planning for discharge in 1-2 days   Procedures:   No admission procedures for hospital encounter.   Antimicrobials:  Anti-infectives (From admission, onward)    Start     Dose/Rate Route Frequency Ordered Stop   07/31/24 1000  azithromycin  (ZITHROMAX ) tablet 500 mg        500 mg Oral Daily 07/30/24  1354     07/30/24 2000  cefTRIAXone  (ROCEPHIN ) 2 g in sodium chloride  0.9 % 100 mL IVPB        2 g 200 mL/hr over 30 Minutes Intravenous Every 24 hours 07/29/24 2120 08/03/24 1959   07/30/24 0800  azithromycin  (ZITHROMAX ) 500 mg in sodium chloride  0.9 % 250 mL IVPB  Status:  Discontinued        500 mg 250 mL/hr over 60 Minutes Intravenous Every 24 hours 07/29/24 2120 07/30/24 1354   07/29/24 1915  cefTRIAXone  (ROCEPHIN ) 1 g in sodium chloride  0.9 % 100 mL IVPB        1 g 200 mL/hr over 30 Minutes Intravenous  Once 07/29/24 1912 07/29/24 2029   07/29/24 1915  doxycycline (VIBRA-TABS) tablet 100 mg        100 mg Oral  Once 07/29/24 1912 07/29/24 1935        Medication:   atorvastatin   40 mg Oral Daily   azithromycin   500 mg Oral Daily   Chlorhexidine  Gluconate Cloth  6 each Topical Daily   dextromethorphan-guaiFENesin  1 tablet Oral BID   enoxaparin (LOVENOX) injection  40 mg Subcutaneous QHS   ezetimibe  10 mg Oral Daily   furosemide   40 mg Intravenous Q12H   hydrALAZINE  25 mg Oral BID   insulin   aspart  0-9 Units Subcutaneous TID WC   ipratropium-albuterol   3 mL Nebulization Q6H   levothyroxine   125 mcg Oral Q0600   losartan   100 mg Oral Daily   methylPREDNISolone  (SOLU-MEDROL ) injection  40 mg Intravenous Q12H   pantoprazole   40 mg Oral Daily   pregabalin   150 mg Oral BID   traZODone   50 mg Oral QHS    acetaminophen  **OR** acetaminophen , hydrALAZINE, ipratropium-albuterol , ondansetron  **OR** ondansetron  (ZOFRAN ) IV   Objective:   Vitals:   07/31/24 0700 07/31/24 0804 07/31/24 0829 07/31/24 1000  BP: (!) 167/79   132/81  Pulse: 82   83  Resp: 16   15  Temp:  97.8 F (36.6 C)    TempSrc:  Oral    SpO2: 95%  92% 96%  Weight:      Height:        Intake/Output Summary (Last 24 hours) at 07/31/2024 1146 Last data filed at 07/31/2024 1100 Gross per 24 hour  Intake 930 ml  Output 4150 ml  Net -3220 ml   Filed Weights   07/29/24 1740 07/30/24 0454 07/31/24 0534   Weight: 83.6 kg 81.3 kg 79.6 kg     Physical examination:   General:  AAO x 3,  cooperative, mild-moderate distress with shortness of breath  HEENT:  Normocephalic, PERRL, otherwise with in Normal limits   Neuro:  CNII-XII intact. , normal motor and sensation, reflexes intact   Lungs:   Diffuse wheezing-rhonchi, bilateral lower lobe crackles   Cardio:    S1/S2, RRR, No murmure, No Rubs or Gallops   Abdomen:  Soft, non-tender, bowel sounds active all four quadrants, no guarding or peritoneal signs.  Muscular  skeletal:  Limited exam -global generalized weaknesses - in bed, able to move all 4 extremities,   2+ pulses,  symmetric, +1 pitting edema  Skin:  Dry, warm to touch, negative for any Rashes,  Wounds: Please see nursing documentation   ------------------------------------------------------------------------------------------------------------------------------------------    LABs:     Latest Ref Rng & Units 07/30/2024    3:35 AM 07/29/2024    5:45 PM 11/05/2017    3:50 PM  CBC  WBC 4.0 - 10.5 K/uL 4.8  7.7  5.4   Hemoglobin 12.0 - 15.0 g/dL 87.3  85.7  85.0   Hematocrit 36.0 - 46.0 % 39.7  44.5  42.1   Platelets 150 - 400 K/uL 152  186  221       Latest Ref Rng & Units 07/30/2024    3:35 AM 07/29/2024    5:45 PM 11/05/2017    3:50 PM  CMP  Glucose 70 - 99 mg/dL 875  883  883   BUN 8 - 23 mg/dL 9  10  13    Creatinine 0.44 - 1.00 mg/dL 9.33  9.39  9.39   Sodium 135 - 145 mmol/L 142  138  135   Potassium 3.5 - 5.1 mmol/L 3.8  3.8  4.0   Chloride 98 - 111 mmol/L 100  99  99   CO2 22 - 32 mmol/L 32  30  31   Calcium  8.9 - 10.3 mg/dL 8.7  9.1  9.8   Total Protein 6.5 - 8.1 g/dL 7.1  7.7    Total Bilirubin 0.0 - 1.2 mg/dL 0.3  0.3    Alkaline Phos 38 - 126 U/L 73  84    AST 15 - 41 U/L 15  17    ALT 0 - 44 U/L 10  10  Micro Results Recent Results (from the past 240 hours)  Culture, blood (routine x 2)     Status: None (Preliminary result)   Collection Time:  07/29/24  6:06 PM   Specimen: Right Antecubital; Blood  Result Value Ref Range Status   Specimen Description RIGHT ANTECUBITAL  Final   Special Requests   Final    BOTTLES DRAWN AEROBIC AND ANAEROBIC Blood Culture adequate volume   Culture   Final    NO GROWTH 2 DAYS Performed at [facility], [address] [place], [place] [address]    Report Status PENDING  Incomplete  Culture, blood (routine x 2)     Status: None (Preliminary result)   Collection Time: 07/29/24  6:13 PM   Specimen: BLOOD RIGHT HAND  Result Value Ref Range Status   Specimen Description BLOOD RIGHT HAND  Final   Special Requests AEROBIC BOTTLE ONLY Blood Culture adequate volume  Final   Culture   Final    NO GROWTH 2 DAYS Performed at [facility], [address] [place], [place] [address]    Report Status PENDING  Incomplete  Resp panel by RT-PCR (RSV, Flu A&B, Covid) Anterior Nasal Swab     Status: None   Collection Time: 07/29/24  7:19 PM   Specimen: Anterior Nasal Swab  Result Value Ref Range Status   SARS Coronavirus 2 by RT PCR NEGATIVE NEGATIVE Final    Comment: (NOTE) SARS-CoV-2 target nucleic acids are NOT DETECTED.  The SARS-CoV-2 RNA is generally detectable in upper respiratory specimens during the acute phase of infection. The lowest concentration of SARS-CoV-2 viral copies this assay can detect is 138 copies/mL. A negative result does not preclude SARS-Cov-2 infection and should not be used as the sole basis for treatment or other patient management decisions. A negative result may occur with  improper specimen collection/handling, submission of specimen other than nasopharyngeal swab, presence of viral mutation(s) within the areas targeted by this assay, and inadequate number of viral copies(<138 copies/mL). A negative result must be combined with clinical observations, patient history, and epidemiological information. The expected result is Negative.  Fact Sheet for Patients:   bloggercourse.com  Fact Sheet for Healthcare Providers:  seriousbroker.it  This test is no t yet approved or cleared by the [place]  FDA and  has been authorized for detection and/or diagnosis of SARS-CoV-2 by FDA under an Emergency Use Authorization (EUA). This EUA will remain  in effect (meaning this test can be used) for the duration of the COVID-19 declaration under Section 564(b)(1) of the Act, 21 U.S.C.section 360bbb-3(b)(1), unless the authorization is terminated  or revoked sooner.       Influenza A by PCR NEGATIVE NEGATIVE Final   Influenza B by PCR NEGATIVE NEGATIVE Final    Comment: (NOTE) The Xpert Xpress SARS-CoV-2/FLU/RSV plus assay is intended as an aid in the diagnosis of influenza from Nasopharyngeal swab specimens and should not be used as a sole basis for treatment. Nasal washings and aspirates are unacceptable for Xpert Xpress SARS-CoV-2/FLU/RSV testing.  Fact Sheet for Patients: bloggercourse.com  Fact Sheet for Healthcare Providers: seriousbroker.it  This test is not yet approved or cleared by the [place]  FDA and has been authorized for detection and/or diagnosis of SARS-CoV-2 by FDA under an Emergency Use Authorization (EUA). This EUA will remain in effect (meaning this test can be used) for the duration of the COVID-19 declaration under Section 564(b)(1) of the Act, 21 U.S.C. section 360bbb-3(b)(1), unless the authorization is terminated or revoked.  Resp Syncytial Virus by PCR NEGATIVE NEGATIVE Final    Comment: (NOTE) Fact Sheet for Patients: bloggercourse.com  Fact Sheet for Healthcare Providers: seriousbroker.it  This test is not yet approved or cleared by the [place]  FDA and has been authorized for detection and/or diagnosis of SARS-CoV-2 by FDA under an Emergency Use  Authorization (EUA). This EUA will remain in effect (meaning this test can be used) for the duration of the COVID-19 declaration under Section 564(b)(1) of the Act, 21 U.S.C. section 360bbb-3(b)(1), unless the authorization is terminated or revoked.  Performed at [facility], [address], [place], [place] [address]   MRSA Next Gen by PCR, Nasal     Status: None   Collection Time: 07/29/24 10:04 PM   Specimen: Nasal Mucosa; Nasal Swab  Result Value Ref Range Status   MRSA by PCR Next Gen NOT DETECTED NOT DETECTED Final    Comment: (NOTE) The GeneXpert MRSA Assay (FDA approved for NASAL specimens only), is one component of a comprehensive MRSA colonization surveillance program. It is not intended to diagnose MRSA infection nor to guide or monitor treatment for MRSA infections. Test performance is not FDA approved in patients less than 73 years old. Performed at [facility], [address], [place], [place] [address]     Radiology Reports No results found.   SIGNED: Adriana DELENA Grams, MD, FHM. FAAFP. Jolynn Pack - [facility] Critical care time spent - 55 min.  In seeing, evaluating and examining the patient. Reviewing medical records, labs, drawn plan of care. [facility],  Pager (please use [email or web address] to page/ text) Please use Epic Secure Chat for non-urgent communication (7AM-7PM)  If 7PM-7AM, please contact night-coverage [email or web address], 07/31/2024, 11:46 AM

## 2024-07-31 NOTE — Progress Notes (Signed)
 Spoke with pharmacy at [facility] who I made aware of patient taking metoprolol  and norvasc  at home, MD made aware and asked if I could reach out to pharmacy to resume.

## 2024-07-31 NOTE — Plan of Care (Signed)
   Problem: Education: Goal: Knowledge of General Education information will improve Description: Including pain rating scale, medication(s)/side effects and non-pharmacologic comfort measures Outcome: Progressing   Problem: Clinical Measurements: Goal: Will remain free from infection Outcome: Progressing Goal: Diagnostic test results will improve Outcome: Progressing Goal: Respiratory complications will improve Outcome: Progressing

## 2024-08-01 DIAGNOSIS — J441 Chronic obstructive pulmonary disease with (acute) exacerbation: Secondary | ICD-10-CM | POA: Diagnosis not present

## 2024-08-01 LAB — GLUCOSE, CAPILLARY
Glucose-Capillary: 118 mg/dL — ABNORMAL HIGH (ref 70–99)
Glucose-Capillary: 159 mg/dL — ABNORMAL HIGH (ref 70–99)
Glucose-Capillary: 199 mg/dL — ABNORMAL HIGH (ref 70–99)
Glucose-Capillary: 165 mg/dL — ABNORMAL HIGH (ref 70–99)

## 2024-08-01 LAB — BASIC METABOLIC PANEL WITH GFR
Anion gap: 10 (ref 5–15)
BUN: 16 mg/dL (ref 8–23)
CO2: 37 mmol/L — ABNORMAL HIGH (ref 22–32)
Calcium: 9.6 mg/dL (ref 8.9–10.3)
Chloride: 96 mmol/L — ABNORMAL LOW (ref 98–111)
Creatinine, Ser: 0.67 mg/dL (ref 0.44–1.00)
GFR, Estimated: >60 mL/min (ref >60)
Glucose, Bld: 134 mg/dL — ABNORMAL HIGH (ref 70–99)
Potassium: 3.7 mmol/L (ref 3.5–5.1)
Sodium: 143 mmol/L (ref 135–145)

## 2024-08-01 LAB — PRO BRAIN NATRIURETIC PEPTIDE: Pro Brain Natriuretic Peptide: 760 pg/mL — ABNORMAL HIGH (ref <300.0)

## 2024-08-01 NOTE — Plan of Care (Signed)
  Problem: Clinical Measurements: Goal: Respiratory complications will improve Outcome: Not Progressing Goal: Cardiovascular complication will be avoided Outcome: Progressing   Problem: Activity: Goal: Risk for activity intolerance will decrease Outcome: Progressing   Problem: Nutrition: Goal: Adequate nutrition will be maintained Outcome: Progressing   Problem: Elimination: Goal: Will not experience complications related to urinary retention Outcome: Progressing

## 2024-08-01 NOTE — Progress Notes (Signed)
 PROGRESS NOTE    Patient: Patricia Stein                            PCP: Maree Isles, MD                    DOB: Jan 26, 1946            DOA: 07/29/2024 FMW:[id number]             DOS: 08/01/2024, 11:18 AM   LOS: 3 days   Date of Service: The patient was seen and examined on 08/01/2024  Subjective:   The patient was seen and examined this morning awake alert oriented, on 8 L of oxygen , satting 100% Was compliant with BiPAP overnight Currently hemodynamically stable   Brief Narrative:   KRISTAIN FILO is a 78 y.o. female with medical history significant of COPD on supplemental oxygen  via [place] at 3 LPM, HTN/ HLD, HFpEF who presents to the emergency department from home via EMS due to 1 day onset of shortness of breath, wheezing, chest tightness, cough with production of green phlegm.  She continued to have shortness of breath despite being compliant with home oxygen .  Home breathing treatment only provided minimal relief.  She denies fever, chills, nausea, vomiting, abdominal pain, increased leg swelling.  She smokes occasionally with her last cigarette smoking being yesterday (about 2-3 cigarettes).     ED course:  Tmax97.1 F, BP was 180/78, O2 sat was 96% on 6 LPM of oxygen  via [place].   VBG done showed pH of 7.23 and pCO2 77, bicarb 38.7, O2 sat 92.3%, patient was then transition to BiPAP due to hypercapnia.  Workup in the ED showed normal CBC.  BMP was normal except for blood glucose of 160.  Troponin was less than 15, lactic acid 1.1, proBNP 827.  Influenza A, B, SARS-CoV-2, RSV was negative.  Blood culture pending. Chest x-ray showed vascular congestion and bilateral perihilar/lower lobe airspace opacities, suspicious for pulmonary edema versus infection. Cardiomegaly. EKG personally reviewed showed normal sinus rhythm at rate of 92 bpm  Patient was treated with IV ceftriaxone  and doxycycline for presumed community-acquired pneumonia, IV Lasix  40 mg x 1 was given due to suspicious  pulmonary edema and breathing treatment was provided for COPD.    Assessment & Plan:   Principal Problem:   Acute exacerbation of chronic obstructive pulmonary disease (COPD) (HCC)   Acute exacerbation of COPD -acute on chronic hypoxic respiratory failure - BiPAP overnight Currently on 8 L of oxygen , satting 100% Continue duo nebs, Mucinex, Solu-Medrol , azithromycin . Continue Protonix  to prevent steroid-induced ulcer Continue incentive spirometry and flutter valve    Elevated proBNP rule out acute on chronic diastolic CHF - proBNP 827 (lower than expected for CHF based on patient's age) >>760.0  - chest x-ray showed vascular congestion and bilateral perihilar/lower lobe airspace opacities, suspicious for pulmonary edema versus infection.   -Continue total input/output, daily weights and fluid restriction  Intake/Output Summary (Last 24 hours) at 08/01/2024 1118 Last data filed at 07/31/2024 2252 Gross per 24 hour  Intake 1200 ml  Output 3000 ml  Net -1800 ml   Filed Weights   07/30/24 0454 07/31/24 0534 08/01/24 0431  Weight: 81.3 kg 79.6 kg 79.9 kg    - Continue with IV Lasix  40 mg p.o. daily  - Continue heart healthy diet      - Echocardiogram done in March 2016 showed LVEF of 60 to  65%, G2 DD. - Echocardiogram done on 07/18/2023 showed LVEF > 55%, G2 DD, mild pulmonary hypertension (Care Everywhere).  - 07/30/2024 -  Echocardiogram >>>> EF JF 60-65%, grade 1 diastolic dysfunction, negative for any other structural abnormalities     CAP POA Still in respiratory distress, otherwise hemodynamically stable, afebrile, normotensive, no leukocytosis Chest x-ray was suggestive of pneumonia -Continue current antibiotic Rocephin /azithromycin  - Follow-up with blood culture, sputum culture -no growth to date - urine Legionella, strep pneumo -none detected, Continue Tylenol  as needed Continue Mucinex, incentive spirometry, flutter valve    Obesity Class I (BMI 34.81) -- Diet  and lifestyle modification   Essential hypertension- Continue hydralazine, losartan    Mixed hyperlipidemia - Continue Lipitor, Zetia   Acquired hypothyroidism - Continue Synthroid    GERD - Continue Protonix    Type 2 diabetes mellitus without complication Continue ISS and hypoglycemic protocol Hold metformin  while inpatient Hemoglobin A1c 5.7,   Tobacco use Patient states that she smokes cigarettes occasionally with last use being yesterday (2 to 3 cigarettes) She was counseled on tobacco use cessation      ----------------------------------------------------------------------------------------------------------------------------------------------- Nutritional status:  The patient's BMI is: Body mass index is 33.28 kg/m. I agree with the assessment and plan as outlined ---------------------------------------------------------------------------------------------------------------------------------------------------- Cultures; Blood Cultures x 2 >> no growth to date Urine Culture  >>>  Sputum Culture >>   ------------------------------------------------------------------------------------------------------------------------------------------------  DVT prophylaxis:  SCDs Start: 07/29/24 2118   Code Status:   Code Status: Full Code  Family Communication: No family member present at bedside-  -Advance care planning has been discussed.   Admission status:   Status is: Inpatient Remains inpatient appropriate because: Will continue to monitor in ICU setting, on 10 L of oxygen , still satting 94%, as needed respiratory support BiPAP,   Disposition: From  - home             Planning for discharge in 1-2 days   Procedures:   No admission procedures for hospital encounter.   Antimicrobials:  Anti-infectives (From admission, onward)    Start     Dose/Rate Route Frequency Ordered Stop   07/31/24 1000  azithromycin  (ZITHROMAX ) tablet 500 mg        500 mg Oral Daily  07/30/24 1354     07/30/24 2000  cefTRIAXone  (ROCEPHIN ) 2 g in sodium chloride  0.9 % 100 mL IVPB        2 g 200 mL/hr over 30 Minutes Intravenous Every 24 hours 07/29/24 2120 08/03/24 1959   07/30/24 0800  azithromycin  (ZITHROMAX ) 500 mg in sodium chloride  0.9 % 250 mL IVPB  Status:  Discontinued        500 mg 250 mL/hr over 60 Minutes Intravenous Every 24 hours 07/29/24 2120 07/30/24 1354   07/29/24 1915  cefTRIAXone  (ROCEPHIN ) 1 g in sodium chloride  0.9 % 100 mL IVPB        1 g 200 mL/hr over 30 Minutes Intravenous  Once 07/29/24 1912 07/29/24 2029   07/29/24 1915  doxycycline (VIBRA-TABS) tablet 100 mg        100 mg Oral  Once 07/29/24 1912 07/29/24 1935        Medication:   amLODipine   10 mg Oral Daily   atorvastatin   40 mg Oral Daily   azithromycin   500 mg Oral Daily   Chlorhexidine  Gluconate Cloth  6 each Topical Daily   dextromethorphan-guaiFENesin  1 tablet Oral BID   ezetimibe  10 mg Oral Daily   furosemide   40 mg Intravenous Q12H   hydrALAZINE  25  mg Oral BID   insulin  aspart  0-9 Units Subcutaneous TID WC   ipratropium-albuterol   3 mL Nebulization Q6H   levothyroxine   125 mcg Oral Q0600   losartan   100 mg Oral Daily   methylPREDNISolone  (SOLU-MEDROL ) injection  40 mg Intravenous Q12H   metoprolol  succinate  100 mg Oral Daily   pantoprazole   40 mg Oral Daily   pregabalin   150 mg Oral BID   traZODone   50 mg Oral QHS    acetaminophen  **OR** acetaminophen , hydrALAZINE, ipratropium-albuterol , nystatin, ondansetron  **OR** ondansetron  (ZOFRAN ) IV   Objective:   Vitals:   08/01/24 0800 08/01/24 0833 08/01/24 0900 08/01/24 0909  BP: (!) 166/76  126/70   Pulse: 63  72   Resp: 14  14   Temp:  98 F (36.7 C)    TempSrc:  Oral    SpO2: 96%  91% 100%  Weight:      Height:        Intake/Output Summary (Last 24 hours) at 08/01/2024 1118 Last data filed at 07/31/2024 2252 Gross per 24 hour  Intake 1200 ml  Output 3000 ml  Net -1800 ml   Filed Weights   07/30/24  0454 07/31/24 0534 08/01/24 0431  Weight: 81.3 kg 79.6 kg 79.9 kg     Physical examination:       General:  AAO x 3,  cooperative, no distress;   HEENT:  Normocephalic, PERRL, otherwise with in Normal limits   Neuro:  CNII-XII intact. , normal motor and sensation, reflexes intact   Lungs:   Shortness of breath, with diffuse rhonchi, improved wheezing, lower lobe crackles   Cardio:    S1/S2, RRR, No murmure, No Rubs or Gallops   Abdomen:  Soft, non-tender, bowel sounds active all four quadrants, no guarding or peritoneal signs.  Muscular  skeletal:  Limited exam -global generalized weaknesses - in bed, able to move all 4 extremities,   2+ pulses,  symmetric, No pitting edema  Skin:  Dry, warm to touch, negative for any Rashes,  Wounds: Please see nursing documentation           ------------------------------------------------------------------------------------------------------------------------------------------    LABs:     Latest Ref Rng & Units 07/30/2024    3:35 AM 07/29/2024    5:45 PM 11/05/2017    3:50 PM  CBC  WBC 4.0 - 10.5 K/uL 4.8  7.7  5.4   Hemoglobin 12.0 - 15.0 g/dL 87.3  85.7  85.0   Hematocrit 36.0 - 46.0 % 39.7  44.5  42.1   Platelets 150 - 400 K/uL 152  186  221       Latest Ref Rng & Units 08/01/2024    4:28 AM 07/30/2024    3:35 AM 07/29/2024    5:45 PM  CMP  Glucose 70 - 99 mg/dL 865  875  883   BUN 8 - 23 mg/dL 16  9  10    Creatinine 0.44 - 1.00 mg/dL 9.32  9.33  9.39   Sodium 135 - 145 mmol/L 143  142  138   Potassium 3.5 - 5.1 mmol/L 3.7  3.8  3.8   Chloride 98 - 111 mmol/L 96  100  99   CO2 22 - 32 mmol/L 37  32  30   Calcium  8.9 - 10.3 mg/dL 9.6  8.7  9.1   Total Protein 6.5 - 8.1 g/dL  7.1  7.7   Total Bilirubin 0.0 - 1.2 mg/dL  0.3  0.3   Alkaline Phos 38 -  126 U/L  73  84   AST 15 - 41 U/L  15  17   ALT 0 - 44 U/L  10  10        Micro Results Recent Results (from the past 240 hours)  Culture, blood (routine x 2)     Status:  None (Preliminary result)   Collection Time: 07/29/24  6:06 PM   Specimen: Right Antecubital; Blood  Result Value Ref Range Status   Specimen Description RIGHT ANTECUBITAL  Final   Special Requests   Final    BOTTLES DRAWN AEROBIC AND ANAEROBIC Blood Culture adequate volume   Culture   Final    NO GROWTH 3 DAYS Performed at [facility], [address] [place], [place] [address]    Report Status PENDING  Incomplete  Culture, blood (routine x 2)     Status: None (Preliminary result)   Collection Time: 07/29/24  6:13 PM   Specimen: BLOOD RIGHT HAND  Result Value Ref Range Status   Specimen Description BLOOD RIGHT HAND  Final   Special Requests AEROBIC BOTTLE ONLY Blood Culture adequate volume  Final   Culture   Final    NO GROWTH 3 DAYS Performed at [facility], [address] [place], [place] [address]    Report Status PENDING  Incomplete  Resp panel by RT-PCR (RSV, Flu A&B, Covid) Anterior Nasal Swab     Status: None   Collection Time: 07/29/24  7:19 PM   Specimen: Anterior Nasal Swab  Result Value Ref Range Status   SARS Coronavirus 2 by RT PCR NEGATIVE NEGATIVE Final    Comment: (NOTE) SARS-CoV-2 target nucleic acids are NOT DETECTED.  The SARS-CoV-2 RNA is generally detectable in upper respiratory specimens during the acute phase of infection. The lowest concentration of SARS-CoV-2 viral copies this assay can detect is 138 copies/mL. A negative result does not preclude SARS-Cov-2 infection and should not be used as the sole basis for treatment or other patient management decisions. A negative result may occur with  improper specimen collection/handling, submission of specimen other than nasopharyngeal swab, presence of viral mutation(s) within the areas targeted by this assay, and inadequate number of viral copies(<138 copies/mL). A negative result must be combined with clinical observations, patient history, and epidemiological information. The expected result is  Negative.  Fact Sheet for Patients:  bloggercourse.com  Fact Sheet for Healthcare Providers:  seriousbroker.it  This test is no t yet approved or cleared by the [place]  FDA and  has been authorized for detection and/or diagnosis of SARS-CoV-2 by FDA under an Emergency Use Authorization (EUA). This EUA will remain  in effect (meaning this test can be used) for the duration of the COVID-19 declaration under Section 564(b)(1) of the Act, 21 U.S.C.section 360bbb-3(b)(1), unless the authorization is terminated  or revoked sooner.       Influenza A by PCR NEGATIVE NEGATIVE Final   Influenza B by PCR NEGATIVE NEGATIVE Final    Comment: (NOTE) The Xpert Xpress SARS-CoV-2/FLU/RSV plus assay is intended as an aid in the diagnosis of influenza from Nasopharyngeal swab specimens and should not be used as a sole basis for treatment. Nasal washings and aspirates are unacceptable for Xpert Xpress SARS-CoV-2/FLU/RSV testing.  Fact Sheet for Patients: bloggercourse.com  Fact Sheet for Healthcare Providers: seriousbroker.it  This test is not yet approved or cleared by the [place]  FDA and has been authorized for detection and/or diagnosis of SARS-CoV-2 by FDA under an Emergency Use Authorization (EUA). This EUA  will remain in effect (meaning this test can be used) for the duration of the COVID-19 declaration under Section 564(b)(1) of the Act, 21 U.S.C. section 360bbb-3(b)(1), unless the authorization is terminated or revoked.     Resp Syncytial Virus by PCR NEGATIVE NEGATIVE Final    Comment: (NOTE) Fact Sheet for Patients: bloggercourse.com  Fact Sheet for Healthcare Providers: seriousbroker.it  This test is not yet approved or cleared by the [place]  FDA and has been authorized for detection and/or diagnosis of  SARS-CoV-2 by FDA under an Emergency Use Authorization (EUA). This EUA will remain in effect (meaning this test can be used) for the duration of the COVID-19 declaration under Section 564(b)(1) of the Act, 21 U.S.C. section 360bbb-3(b)(1), unless the authorization is terminated or revoked.  Performed at [facility], [address] [place], [place] [address]   MRSA Next Gen by PCR, Nasal     Status: None   Collection Time: 07/29/24 10:04 PM   Specimen: Nasal Mucosa; Nasal Swab  Result Value Ref Range Status   MRSA by PCR Next Gen NOT DETECTED NOT DETECTED Final    Comment: (NOTE) The GeneXpert MRSA Assay (FDA approved for NASAL specimens only), is one component of a comprehensive MRSA colonization surveillance program. It is not intended to diagnose MRSA infection nor to guide or monitor treatment for MRSA infections. Test performance is not FDA approved in patients less than 89 years old. Performed at [facility], [address], [place], [place] [address]     Radiology Reports No results found.   SIGNED: Adriana DELENA Grams, MD, FHM. FAAFP. Jolynn Pack - [facility] Critical care time spent - 55 min.  In seeing, evaluating and examining the patient. Reviewing medical records, labs, drawn plan of care. [facility],  Pager (please use [email or web address] to page/ text) Please use Epic Secure Chat for non-urgent communication (7AM-7PM)  If 7PM-7AM, please contact night-coverage [email or web address], 08/01/2024, 11:18 AM

## 2024-08-01 NOTE — Plan of Care (Signed)

## 2024-08-02 DIAGNOSIS — I5031 Acute diastolic (congestive) heart failure: Secondary | ICD-10-CM | POA: Diagnosis not present

## 2024-08-02 DIAGNOSIS — J441 Chronic obstructive pulmonary disease with (acute) exacerbation: Secondary | ICD-10-CM | POA: Diagnosis not present

## 2024-08-02 LAB — GLUCOSE, CAPILLARY
Glucose-Capillary: 127 mg/dL — ABNORMAL HIGH (ref 70–99)
Glucose-Capillary: 166 mg/dL — ABNORMAL HIGH (ref 70–99)

## 2024-08-02 LAB — PRO BRAIN NATRIURETIC PEPTIDE: Pro Brain Natriuretic Peptide: 649 pg/mL — ABNORMAL HIGH (ref <300.0)

## 2024-08-02 LAB — LEGIONELLA PNEUMOPHILA SEROGP 1 UR AG: L. pneumophila Serogp 1 Ur Ag: NEGATIVE

## 2024-08-02 MED ORDER — ALBUTEROL SULFATE HFA 108 (90 BASE) MCG/ACT IN AERS: 2.0000 | INHALATION_SPRAY | Freq: Four times a day (QID) | RESPIRATORY_TRACT | 3 refills | Status: AC | PRN

## 2024-08-02 MED ORDER — PREDNISONE 20 MG PO TABS: ORAL_TABLET | ORAL | 0 refills | Status: AC

## 2024-08-02 MED ORDER — INSULIN ASPART 100 UNIT/ML IJ SOLN
0.0000 [IU] | Freq: Three times a day (TID) | INTRAMUSCULAR | Status: DC
  Administered 2024-08-02: 2 [IU] via SUBCUTANEOUS
  Filled 2024-08-02: qty 1

## 2024-08-02 MED ORDER — DOXYCYCLINE HYCLATE 100 MG PO CAPS: 100.0000 mg | ORAL_CAPSULE | Freq: Two times a day (BID) | ORAL | 0 refills | Status: AC

## 2024-08-02 NOTE — Care Management Important Message (Signed)
 Important Message  Patient Details  Name: Patricia Stein MRN: [id number] Date of Birth: 02-Aug-1946   Important Message Given:  Yes - Medicare IM     Arzell Mcgeehan L Deloyce Walthers 08/02/2024, 11:01 AM

## 2024-08-02 NOTE — Progress Notes (Signed)
 Patient discharged home. Discharge instructions reviewed with the patient and all questions answered. IV removed and site intact. Family member brought patient's portable oxygen  (patient currently using 3 liters). Patient verbalizes that she is satisfied that all belongings were returned.

## 2024-08-02 NOTE — Plan of Care (Signed)
  Problem: Clinical Measurements: Goal: Respiratory complications will improve Outcome: Progressing Goal: Cardiovascular complication will be avoided Outcome: Progressing   Problem: Activity: Goal: Risk for activity intolerance will decrease Outcome: Progressing   Problem: Nutrition: Goal: Adequate nutrition will be maintained Outcome: Progressing   Problem: Fluid Volume: Goal: Ability to maintain a balanced intake and output will improve Outcome: Progressing

## 2024-08-02 NOTE — Discharge Summary (Signed)
 Physician Discharge Summary  Patricia Stein FMW:[id number] DOB: 09-15-1946 DOA: 07/29/2024  PCP: Maree Isles, MD  Admit date: 07/29/2024 Discharge date: 08/02/2024  Admitted From:  Home  Disposition: Home   Recommendations for Outpatient Follow-up:  Follow up with PCP in 1 weeks Please check BMP in 1 week   Discharge Condition: STABLE at BASELINE  CODE STATUS: FULL DIET: resume low sodium, heart healthy foods   Brief Hospitalization Summary: Please see all hospital notes, images, labs for full details of the hospitalization. Admission provider HPI:   78 y.o. female with medical history significant of COPD on supplemental oxygen  via [place] at 3 LPM, HTN/ HLD, HFpEF who presents to the emergency department from home via EMS due to 1 day onset of shortness of breath, wheezing, chest tightness, cough with production of green phlegm.  She continued to have shortness of breath despite being compliant with home oxygen .  Home breathing treatment only provided minimal relief.  She denies fever, chills, nausea, vomiting, abdominal pain, increased leg swelling.  She smokes occasionally with her last cigarette smoking being yesterday (about 2-3 cigarettes).    ED course:  Tmax97.1 F, BP was 180/78, O2 sat was 96% on 6 LPM of oxygen  via [place].  VBG done showed pH of 7.23 and pCO2 77, bicarb 38.7, O2 sat 92.3%, patient was then transition to BiPAP due to hypercapnia.  Workup in the ED showed normal CBC.  BMP was normal except for blood glucose of 160.  Troponin was less than 15, lactic acid 1.1, proBNP 827.  Influenza A, B, SARS-CoV-2, RSV was negative.  Blood culture pending.  Chest x-ray showed vascular congestion and bilateral perihilar/lower lobe airspace opacities, suspicious for pulmonary edema versus infection. Cardiomegaly. EKG personally reviewed showed normal sinus rhythm at rate of 92 bpm   Patient was treated with IV ceftriaxone  and doxycycline for presumed community-acquired pneumonia, IV Lasix  40 mg  x 1 was given due to suspicious pulmonary edema and breathing treatment was provided for COPD.  Hospital course by listed problems addressed  Acute exacerbation of COPD -acute on chronic hypoxic respiratory failure Briefly was on bipap but now back to her baseline home oxygen  requirement. She reports feeling a lot better and back to her baseline She was treated with duo nebs, Mucinex, Solu-Medrol , azithromycin , ceftriaxone  Continue Protonix  to prevent steroid-induced ulcer Continue incentive spirometry and flutter valve DC home on prednisone taper and complete course of oral doxycycline    Acute HFpEF - she diuresed >8L on IV lasix  injections in hospital and feeling much better - she was advised to resume home oral lasix  40 mg daily  - chest x-ray showed vascular congestion and bilateral perihilar/lower lobe airspace opacities, suspicious for pulmonary edema versus infection.    -monitored total input/output, daily weights and fluid restriction   Intake/Output Summary (Last 24 hours) at 08/01/2024 1118 Last data filed at 07/31/2024 2252    Gross per 24 hour  Intake 1200 ml  Output 3000 ml  Net -1800 ml    Filed Weights   07/31/24 0534 08/01/24 0431 08/02/24 0430  Weight: 79.6 kg 79.9 kg 80.6 kg    - treated with IV Lasix  40 mg BID   - Continue heart healthy diet      - Echocardiogram done in March 2016 showed LVEF of 60 to 65%, G2 DD. - Echocardiogram done on 07/18/2023 showed LVEF > 55%, G2 DD, mild pulmonary hypertension (Care Everywhere).  - 07/30/2024 -  Echocardiogram >>>> EF JF 60-65%, grade 1 diastolic  dysfunction, negative for any other structural abnormalities     CAP POA Still in respiratory distress, otherwise hemodynamically stable, afebrile, normotensive, no leukocytosis - Chest x-ray was suggestive of pneumonia - Continue current antibiotic Rocephin /azithromycin  - Follow-up with blood culture, sputum culture -no growth to date - urine Legionella, strep pneumo  -none detected, - Continue Tylenol  as needed - treated with Mucinex, incentive spirometry, flutter valve    Obesity Class I (BMI 34.81) -- Diet and lifestyle modification   Essential hypertension- Continue hydralazine, losartan    Mixed hyperlipidemia - Continue Lipitor, Zetia   Acquired hypothyroidism - Continue Synthroid    GERD - Continue Protonix    Type 2 diabetes mellitus without complication Continue ISS and hypoglycemic protocol Hold metformin  while inpatient Hemoglobin A1c 5.7   Tobacco use Patient states that she smokes cigarettes occasionally with last use being yesterday (2 to 3 cigarettes) She was counseled on tobacco use cessation   Discharge Diagnoses:  Principal Problem:   Acute exacerbation of chronic obstructive pulmonary disease (COPD) (HCC)  Discharge Instructions:  Allergies as of 08/02/2024       Reactions   Tape Itching, Rash, Other (See Comments)   Mainly just the clear plastic tape.  Redness        Medication List     TAKE these medications    albuterol  108 (90 Base) MCG/ACT inhaler Commonly known as: VENTOLIN  HFA Inhale 2 puffs into the lungs every 6 (six) hours as needed for wheezing or shortness of breath. What changed: how much to take   amLODipine  10 MG tablet Commonly known as: NORVASC  Take 10 mg by mouth daily.   atorvastatin  40 MG tablet Commonly known as: LIPITOR Take 40 mg by mouth daily.   budesonide-formoterol 160-4.5 MCG/ACT inhaler Commonly known as: SYMBICORT Inhale 1 puff into the lungs 2 (two) times daily.   diazepam  5 MG tablet Commonly known as: VALIUM  Take 5 mg by mouth at bedtime. Up to 2 times daily as needed, always takes at bedtime   doxycycline 100 MG capsule Commonly known as: VIBRAMYCIN Take 1 capsule (100 mg total) by mouth 2 (two) times daily for 3 days. Start taking on: August 03, 2024   DULoxetine  60 MG capsule Commonly known as: CYMBALTA  Take 60 mg by mouth 2 (two) times daily.   furosemide   40 MG tablet Commonly known as: LASIX  Take 40 mg by mouth every morning.   hydrALAZINE 25 MG tablet Commonly known as: APRESOLINE Take 25 mg by mouth 2 (two) times daily.   levothyroxine  125 MCG tablet Commonly known as: SYNTHROID  Take 125 mcg by mouth daily.   losartan  100 MG tablet Commonly known as: COZAAR  Take 100 mg by mouth daily.   metFORMIN  1000 MG tablet Commonly known as: GLUCOPHAGE  Take 1,000 mg by mouth 2 (two) times daily.   metoprolol  succinate 100 MG 24 hr tablet Commonly known as: TOPROL -XL Take 100 mg by mouth at bedtime. Take with or immediately following a meal.   omeprazole 20 MG capsule Commonly known as: PRILOSEC Take 20 mg by mouth daily as needed (heartburn, acid reflux).   OXYGEN  Inhale 3-4 L/min into the lungs continuous. Through a BiPAP at nighttime   predniSONE 20 MG tablet Commonly known as: DELTASONE Take 3 PO QAM x3days, 2 PO QAM x3days, 1 PO QAM x3days Start taking on: August 03, 2024   pregabalin  150 MG capsule Commonly known as: LYRICA  Take 150 mg by mouth 2 (two) times daily.   traZODone  50 MG tablet Commonly known as: DESYREL   Take 50 mg by mouth at bedtime.        Follow-up Information     Maree Isles, MD. Schedule an appointment as soon as possible for a visit in 1 week(s).   Specialty: Internal Medicine Why: Hospital Follow Up Contact information: [address]  [place] [address] [phone]                Allergies  Allergen Reactions   Tape Itching, Rash and Other (See Comments)    Mainly just the clear plastic tape.  Redness   Allergies as of 08/02/2024       Reactions   Tape Itching, Rash, Other (See Comments)   Mainly just the clear plastic tape.  Redness        Medication List     TAKE these medications    albuterol  108 (90 Base) MCG/ACT inhaler Commonly known as: VENTOLIN  HFA Inhale 2 puffs into the lungs every 6 (six) hours as needed for wheezing or shortness of breath. What changed:  how much to take   amLODipine  10 MG tablet Commonly known as: NORVASC  Take 10 mg by mouth daily.   atorvastatin  40 MG tablet Commonly known as: LIPITOR Take 40 mg by mouth daily.   budesonide-formoterol 160-4.5 MCG/ACT inhaler Commonly known as: SYMBICORT Inhale 1 puff into the lungs 2 (two) times daily.   diazepam  5 MG tablet Commonly known as: VALIUM  Take 5 mg by mouth at bedtime. Up to 2 times daily as needed, always takes at bedtime   doxycycline 100 MG capsule Commonly known as: VIBRAMYCIN Take 1 capsule (100 mg total) by mouth 2 (two) times daily for 3 days. Start taking on: August 03, 2024   DULoxetine  60 MG capsule Commonly known as: CYMBALTA  Take 60 mg by mouth 2 (two) times daily.   furosemide  40 MG tablet Commonly known as: LASIX  Take 40 mg by mouth every morning.   hydrALAZINE 25 MG tablet Commonly known as: APRESOLINE Take 25 mg by mouth 2 (two) times daily.   levothyroxine  125 MCG tablet Commonly known as: SYNTHROID  Take 125 mcg by mouth daily.   losartan  100 MG tablet Commonly known as: COZAAR  Take 100 mg by mouth daily.   metFORMIN  1000 MG tablet Commonly known as: GLUCOPHAGE  Take 1,000 mg by mouth 2 (two) times daily.   metoprolol  succinate 100 MG 24 hr tablet Commonly known as: TOPROL -XL Take 100 mg by mouth at bedtime. Take with or immediately following a meal.   omeprazole 20 MG capsule Commonly known as: PRILOSEC Take 20 mg by mouth daily as needed (heartburn, acid reflux).   OXYGEN  Inhale 3-4 L/min into the lungs continuous. Through a BiPAP at nighttime   predniSONE 20 MG tablet Commonly known as: DELTASONE Take 3 PO QAM x3days, 2 PO QAM x3days, 1 PO QAM x3days Start taking on: August 03, 2024   pregabalin  150 MG capsule Commonly known as: LYRICA  Take 150 mg by mouth 2 (two) times daily.   traZODone  50 MG tablet Commonly known as: DESYREL  Take 50 mg by mouth at bedtime.        Procedures/Studies: ECHOCARDIOGRAM  COMPLETE Result Date: 07/30/2024    ECHOCARDIOGRAM REPORT   Patient Name:   Patricia Stein Date of Exam: 07/30/2024 Medical Rec #:  [id number]        Height:       61.0 in Accession #:    [id number]       Weight:       179.2 lb Date  of Birth:  02-May-1946        BSA:          1.803 m Patient Age:    78 years         BP:           169/71 mmHg Patient Gender: F                HR:           79 bpm. Exam Location:  [facility] Procedure: 2D Echo, Cardiac Doppler and Color Doppler (Both Spectral and Color            Flow Doppler were utilized during procedure). Indications:    CHF  History:        Patient has no prior history of Echocardiogram examinations.                 COPD; Risk Factors:Hypertension, Diabetes and Sleep Apnea.  Sonographer:    Philomena Daring Referring Phys: [id number] OLADAPO ADEFESO IMPRESSIONS  1. Left ventricular ejection fraction, by estimation, is 60 to 65%. The left ventricle has normal function. The left ventricle has no regional wall motion abnormalities. Left ventricular diastolic parameters are consistent with Grade I diastolic dysfunction (impaired relaxation). Elevated left ventricular end-diastolic pressure.  2. Right ventricular systolic function is normal. The right ventricular size is normal. There is normal pulmonary artery systolic pressure.  3. The mitral valve is normal in structure. Trivial mitral valve regurgitation. No evidence of mitral stenosis.  4. The aortic valve was not well visualized. Aortic valve regurgitation is not visualized. Mild aortic valve stenosis. Aortic valve area, by VTI measures 1.79 cm. Aortic valve mean gradient measures 9.5 mmHg. Aortic valve Vmax measures 2.16 m/s.  5. The inferior vena cava is normal in size with greater than 50% respiratory variability, suggesting right atrial pressure of 3 mmHg. Comparison(s): No prior Echocardiogram. FINDINGS  Left Ventricle: Left ventricular ejection fraction, by estimation, is 60 to 65%. The left ventricle has normal  function. The left ventricle has no regional wall motion abnormalities. Strain was performed and the global longitudinal strain is indeterminate. The left ventricular internal cavity size was normal in size. There is no left ventricular hypertrophy. Left ventricular diastolic parameters are consistent with Grade I diastolic dysfunction (impaired relaxation). Elevated left ventricular end-diastolic pressure. Right Ventricle: The right ventricular size is normal. No increase in right ventricular wall thickness. Right ventricular systolic function is normal. There is normal pulmonary artery systolic pressure. The tricuspid regurgitant velocity is 1.41 m/s, and  with an assumed right atrial pressure of 3 mmHg, the estimated right ventricular systolic pressure is 11.0 mmHg. Left Atrium: Left atrial size was normal in size. Right Atrium: Right atrial size was normal in size. Pericardium: There is no evidence of pericardial effusion. Mitral Valve: The mitral valve is normal in structure. Trivial mitral valve regurgitation. No evidence of mitral valve stenosis. Tricuspid Valve: The tricuspid valve is normal in structure. Tricuspid valve regurgitation is not demonstrated. No evidence of tricuspid stenosis. Aortic Valve: The aortic valve was not well visualized. Aortic valve regurgitation is not visualized. Mild aortic stenosis is present. Aortic valve mean gradient measures 9.5 mmHg. Aortic valve peak gradient measures 18.6 mmHg. Aortic valve area, by VTI measures 1.79 cm. Pulmonic Valve: The pulmonic valve was not well visualized. Pulmonic valve regurgitation is not visualized. No evidence of pulmonic stenosis. Aorta: The aortic root and ascending aorta are structurally normal, with no evidence of dilitation. Venous: The inferior vena  cava is normal in size with greater than 50% respiratory variability, suggesting right atrial pressure of 3 mmHg. IAS/Shunts: No atrial level shunt detected by color flow Doppler. Additional  Comments: 3D was performed not requiring image post processing on an independent workstation and was indeterminate.  LEFT VENTRICLE PLAX 2D LVIDd:         4.70 cm   Diastology LVIDs:         2.90 cm   LV e' medial:    5.87 cm/s LV PW:         1.00 cm   LV E/e' medial:  23.3 LV IVS:        1.10 cm   LV e' lateral:   7.94 cm/s LVOT diam:     1.80 cm   LV E/e' lateral: 17.3 LV SV:         80 LV SV Index:   44 LVOT Area:     2.54 cm LV IVRT:       71 msec  RIGHT VENTRICLE             IVC RV S prime:     16.00 cm/s  IVC diam: 1.90 cm TAPSE (M-mode): 2.3 cm LEFT ATRIUM             Index        RIGHT ATRIUM           Index LA diam:        3.30 cm 1.83 cm/m   RA Area:     17.60 cm LA Vol (A2C):   54.5 ml 30.23 ml/m  RA Volume:   45.10 ml  25.02 ml/m LA Vol (A4C):   48.4 ml 26.85 ml/m LA Biplane Vol: 51.4 ml 28.51 ml/m  AORTIC VALVE AV Area (Vmax):    1.68 cm AV Area (Vmean):   1.73 cm AV Area (VTI):     1.79 cm AV Vmax:           215.50 cm/s AV Vmean:          144.000 cm/s AV VTI:            0.448 m AV Peak Grad:      18.6 mmHg AV Mean Grad:      9.5 mmHg LVOT Vmax:         142.00 cm/s LVOT Vmean:        97.900 cm/s LVOT VTI:          0.315 m LVOT/AV VTI ratio: 0.70  AORTA Ao Root diam: 2.60 cm Ao Asc diam:  3.50 cm MITRAL VALVE                TRICUSPID VALVE MV Area (PHT): 3.13 cm     TR Peak grad:   8.0 mmHg MV Decel Time: 242 msec     TR Vmax:        141.00 cm/s MV E velocity: 137.00 cm/s MV A velocity: 128.00 cm/s  SHUNTS MV E/A ratio:  1.07         Systemic VTI:  0.32 m                             Systemic Diam: 1.80 cm Vishnu Priya Mallipeddi Electronically signed by Diannah Late Mallipeddi Signature Date/Time: 07/30/2024/4:20:05 PM    Final    DG Chest Port 1 View Result Date: 07/29/2024 EXAM: 1 VIEW(S) XRAY OF THE CHEST 07/29/2024 06:50:01 PM COMPARISON: 10/11/2012  CLINICAL HISTORY: dyspnea FINDINGS: LUNGS AND PLEURA: Vascular congestion and bilateral perihilar and lower lobe airspace opacities  concerning for edema or infection. No pleural effusion. No pneumothorax. HEART AND MEDIASTINUM: Cardiomegaly, aortic atherosclerosis. BONES AND SOFT TISSUES: No acute osseous abnormality. IMPRESSION: 1. Vascular congestion and bilateral perihilar/lower lobe airspace opacities, suspicious for pulmonary edema versus infection. 2. Cardiomegaly. Electronically signed by: Franky Crease MD 07/29/2024 07:08 PM EDT RP Workstation: [id number]     Subjective: Patient reporting she is having no shortness of breath, no chest pain, no fever or chills and eager to discharge today.  Sitting up in a chair in no apparent distress.   Discharge Exam: Vitals:   08/02/24 0900 08/02/24 0951  BP: (!) 166/78 (!) 179/77  Pulse: 71   Resp: 19   Temp:    SpO2: 100%    Vitals:   08/02/24 0752 08/02/24 0850 08/02/24 0900 08/02/24 0951  BP: (!) 149/93  (!) 166/78 (!) 179/77  Pulse: 63  71   Resp: 17  19   Temp:      TempSrc:      SpO2: 100% 94% 100%   Weight:      Height:       General: Pt is alert, awake, not in acute distress Cardiovascular: normal S1/S2 +, no rubs, no gallops Respiratory: good air movement bilaterally, no wheezing, no rhonchi Abdominal: Soft, NT, ND, bowel sounds + Extremities: trace edema, no cyanosis   The results of significant diagnostics from this hospitalization (including imaging, microbiology, ancillary and laboratory) are listed below for reference.     Microbiology: Recent Results (from the past 240 hours)  Culture, blood (routine x 2)     Status: None (Preliminary result)   Collection Time: 07/29/24  6:06 PM   Specimen: Right Antecubital; Blood  Result Value Ref Range Status   Specimen Description RIGHT ANTECUBITAL  Final   Special Requests   Final    BOTTLES DRAWN AEROBIC AND ANAEROBIC Blood Culture adequate volume   Culture   Final    NO GROWTH 3 DAYS Performed at [facility], [address], [place], [place] [address]    Report Status PENDING  Incomplete  Culture,  blood (routine x 2)     Status: None (Preliminary result)   Collection Time: 07/29/24  6:13 PM   Specimen: BLOOD RIGHT HAND  Result Value Ref Range Status   Specimen Description BLOOD RIGHT HAND  Final   Special Requests AEROBIC BOTTLE ONLY Blood Culture adequate volume  Final   Culture   Final    NO GROWTH 3 DAYS Performed at [facility], [address] [place], [place] [address]    Report Status PENDING  Incomplete  Resp panel by RT-PCR (RSV, Flu A&B, Covid) Anterior Nasal Swab     Status: None   Collection Time: 07/29/24  7:19 PM   Specimen: Anterior Nasal Swab  Result Value Ref Range Status   SARS Coronavirus 2 by RT PCR NEGATIVE NEGATIVE Final    Comment: (NOTE) SARS-CoV-2 target nucleic acids are NOT DETECTED.  The SARS-CoV-2 RNA is generally detectable in upper respiratory specimens during the acute phase of infection. The lowest concentration of SARS-CoV-2 viral copies this assay can detect is 138 copies/mL. A negative result does not preclude SARS-Cov-2 infection and should not be used as the sole basis for treatment or other patient management decisions. A negative result may occur with  improper specimen collection/handling, submission of specimen other than nasopharyngeal swab, presence of viral mutation(s) within the  areas targeted by this assay, and inadequate number of viral copies(<138 copies/mL). A negative result must be combined with clinical observations, patient history, and epidemiological information. The expected result is Negative.  Fact Sheet for Patients:  bloggercourse.com  Fact Sheet for Healthcare Providers:  seriousbroker.it  This test is no t yet approved or cleared by the [place]  FDA and  has been authorized for detection and/or diagnosis of SARS-CoV-2 by FDA under an Emergency Use Authorization (EUA). This EUA will remain  in effect (meaning this test can be used) for the duration of  the COVID-19 declaration under Section 564(b)(1) of the Act, 21 U.S.C.section 360bbb-3(b)(1), unless the authorization is terminated  or revoked sooner.       Influenza A by PCR NEGATIVE NEGATIVE Final   Influenza B by PCR NEGATIVE NEGATIVE Final    Comment: (NOTE) The Xpert Xpress SARS-CoV-2/FLU/RSV plus assay is intended as an aid in the diagnosis of influenza from Nasopharyngeal swab specimens and should not be used as a sole basis for treatment. Nasal washings and aspirates are unacceptable for Xpert Xpress SARS-CoV-2/FLU/RSV testing.  Fact Sheet for Patients: bloggercourse.com  Fact Sheet for Healthcare Providers: seriousbroker.it  This test is not yet approved or cleared by the [place]  FDA and has been authorized for detection and/or diagnosis of SARS-CoV-2 by FDA under an Emergency Use Authorization (EUA). This EUA will remain in effect (meaning this test can be used) for the duration of the COVID-19 declaration under Section 564(b)(1) of the Act, 21 U.S.C. section 360bbb-3(b)(1), unless the authorization is terminated or revoked.     Resp Syncytial Virus by PCR NEGATIVE NEGATIVE Final    Comment: (NOTE) Fact Sheet for Patients: bloggercourse.com  Fact Sheet for Healthcare Providers: seriousbroker.it  This test is not yet approved or cleared by the [place]  FDA and has been authorized for detection and/or diagnosis of SARS-CoV-2 by FDA under an Emergency Use Authorization (EUA). This EUA will remain in effect (meaning this test can be used) for the duration of the COVID-19 declaration under Section 564(b)(1) of the Act, 21 U.S.C. section 360bbb-3(b)(1), unless the authorization is terminated or revoked.  Performed at [facility], [address], [place], [place] [address]   MRSA Next Gen by PCR, Nasal     Status: None   Collection Time: 07/29/24 10:04  PM   Specimen: Nasal Mucosa; Nasal Swab  Result Value Ref Range Status   MRSA by PCR Next Gen NOT DETECTED NOT DETECTED Final    Comment: (NOTE) The GeneXpert MRSA Assay (FDA approved for NASAL specimens only), is one component of a comprehensive MRSA colonization surveillance program. It is not intended to diagnose MRSA infection nor to guide or monitor treatment for MRSA infections. Test performance is not FDA approved in patients less than 37 years old. Performed at [facility], [address], [place], [place] [address]      Labs: BNP (last 3 results) No results for input(s): BNP in the last 8760 hours. Basic Metabolic Panel: Recent Labs  Lab 07/29/24 1745 07/30/24 0335 08/01/24 0428  NA 138 142 143  K 3.8 3.8 3.7  CL 99 100 96*  CO2 30 32 37*  GLUCOSE 116* 124* 134*  BUN 10 9 16   CREATININE 0.60 0.66 0.67  CALCIUM  9.1 8.7* 9.6  MG  --  2.0  --   PHOS  --  4.2  --    Liver Function Tests: Recent Labs  Lab 07/29/24 1745 07/30/24 0335  AST 17 15  ALT 10 10  ALKPHOS 84 73  BILITOT 0.3 0.3  PROT 7.7 7.1  ALBUMIN 4.5 4.3   No results for input(s): LIPASE, AMYLASE in the last 168 hours. No results for input(s): AMMONIA in the last 168 hours. CBC: Recent Labs  Lab 07/29/24 1745 07/30/24 0335  WBC 7.7 4.8  NEUTROABS 4.6  --   HGB 14.2 12.6  HCT 44.5 39.7  MCV 96.1 96.1  PLT 186 152   Cardiac Enzymes: No results for input(s): CKTOTAL, CKMB, CKMBINDEX, TROPONINI in the last 168 hours. BNP: Invalid input(s): POCBNP CBG: Recent Labs  Lab 08/01/24 0727 08/01/24 1116 08/01/24 1632 08/01/24 2114 08/02/24 0744  GLUCAP 118* 159* 199* 165* 127*   D-Dimer No results for input(s): DDIMER in the last 72 hours. Hgb A1c No results for input(s): HGBA1C in the last 72 hours. Lipid Profile No results for input(s): CHOL, HDL, LDLCALC, TRIG, CHOLHDL, LDLDIRECT in the last 72 hours. Thyroid  function studies No results for  input(s): TSH, T4TOTAL, T3FREE, THYROIDAB in the last 72 hours.  Invalid input(s): FREET3 Anemia work up No results for input(s): VITAMINB12, FOLATE, FERRITIN, TIBC, IRON, RETICCTPCT in the last 72 hours. Urinalysis    Component Value Date/Time   COLORURINE YELLOW 07/29/2024 2026   APPEARANCEUR CLEAR 07/29/2024 2026   LABSPEC 1.011 07/29/2024 2026   PHURINE 5.0 07/29/2024 2026   GLUCOSEU NEGATIVE 07/29/2024 2026   HGBUR NEGATIVE 07/29/2024 2026   BILIRUBINUR NEGATIVE 07/29/2024 2026   KETONESUR NEGATIVE 07/29/2024 2026   PROTEINUR >=300 (A) 07/29/2024 2026   UROBILINOGEN 1.0 10/30/2012 1336   NITRITE NEGATIVE 07/29/2024 2026   LEUKOCYTESUR NEGATIVE 07/29/2024 2026   Sepsis Labs Recent Labs  Lab 07/29/24 1745 07/30/24 0335  WBC 7.7 4.8   Microbiology Recent Results (from the past 240 hours)  Culture, blood (routine x 2)     Status: None (Preliminary result)   Collection Time: 07/29/24  6:06 PM   Specimen: Right Antecubital; Blood  Result Value Ref Range Status   Specimen Description RIGHT ANTECUBITAL  Final   Special Requests   Final    BOTTLES DRAWN AEROBIC AND ANAEROBIC Blood Culture adequate volume   Culture   Final    NO GROWTH 3 DAYS Performed at [facility], [address], [place], [place] [address]    Report Status PENDING  Incomplete  Culture, blood (routine x 2)     Status: None (Preliminary result)   Collection Time: 07/29/24  6:13 PM   Specimen: BLOOD RIGHT HAND  Result Value Ref Range Status   Specimen Description BLOOD RIGHT HAND  Final   Special Requests AEROBIC BOTTLE ONLY Blood Culture adequate volume  Final   Culture   Final    NO GROWTH 3 DAYS Performed at [facility], [address], [place], [place] [address]    Report Status PENDING  Incomplete  Resp panel by RT-PCR (RSV, Flu A&B, Covid) Anterior Nasal Swab     Status: None   Collection Time: 07/29/24  7:19 PM   Specimen: Anterior Nasal Swab  Result Value Ref Range  Status   SARS Coronavirus 2 by RT PCR NEGATIVE NEGATIVE Final    Comment: (NOTE) SARS-CoV-2 target nucleic acids are NOT DETECTED.  The SARS-CoV-2 RNA is generally detectable in upper respiratory specimens during the acute phase of infection. The lowest concentration of SARS-CoV-2 viral copies this assay can detect is 138 copies/mL. A negative result does not preclude SARS-Cov-2 infection and should not be used as the sole basis for treatment  or other patient management decisions. A negative result may occur with  improper specimen collection/handling, submission of specimen other than nasopharyngeal swab, presence of viral mutation(s) within the areas targeted by this assay, and inadequate number of viral copies(<138 copies/mL). A negative result must be combined with clinical observations, patient history, and epidemiological information. The expected result is Negative.  Fact Sheet for Patients:  bloggercourse.com  Fact Sheet for Healthcare Providers:  seriousbroker.it  This test is no t yet approved or cleared by the [place]  FDA and  has been authorized for detection and/or diagnosis of SARS-CoV-2 by FDA under an Emergency Use Authorization (EUA). This EUA will remain  in effect (meaning this test can be used) for the duration of the COVID-19 declaration under Section 564(b)(1) of the Act, 21 U.S.C.section 360bbb-3(b)(1), unless the authorization is terminated  or revoked sooner.       Influenza A by PCR NEGATIVE NEGATIVE Final   Influenza B by PCR NEGATIVE NEGATIVE Final    Comment: (NOTE) The Xpert Xpress SARS-CoV-2/FLU/RSV plus assay is intended as an aid in the diagnosis of influenza from Nasopharyngeal swab specimens and should not be used as a sole basis for treatment. Nasal washings and aspirates are unacceptable for Xpert Xpress SARS-CoV-2/FLU/RSV testing.  Fact Sheet for  Patients: bloggercourse.com  Fact Sheet for Healthcare Providers: seriousbroker.it  This test is not yet approved or cleared by the [place]  FDA and has been authorized for detection and/or diagnosis of SARS-CoV-2 by FDA under an Emergency Use Authorization (EUA). This EUA will remain in effect (meaning this test can be used) for the duration of the COVID-19 declaration under Section 564(b)(1) of the Act, 21 U.S.C. section 360bbb-3(b)(1), unless the authorization is terminated or revoked.     Resp Syncytial Virus by PCR NEGATIVE NEGATIVE Final    Comment: (NOTE) Fact Sheet for Patients: bloggercourse.com  Fact Sheet for Healthcare Providers: seriousbroker.it  This test is not yet approved or cleared by the [place]  FDA and has been authorized for detection and/or diagnosis of SARS-CoV-2 by FDA under an Emergency Use Authorization (EUA). This EUA will remain in effect (meaning this test can be used) for the duration of the COVID-19 declaration under Section 564(b)(1) of the Act, 21 U.S.C. section 360bbb-3(b)(1), unless the authorization is terminated or revoked.  Performed at [facility], [address] [place], [place] [address]   MRSA Next Gen by PCR, Nasal     Status: None   Collection Time: 07/29/24 10:04 PM   Specimen: Nasal Mucosa; Nasal Swab  Result Value Ref Range Status   MRSA by PCR Next Gen NOT DETECTED NOT DETECTED Final    Comment: (NOTE) The GeneXpert MRSA Assay (FDA approved for NASAL specimens only), is one component of a comprehensive MRSA colonization surveillance program. It is not intended to diagnose MRSA infection nor to guide or monitor treatment for MRSA infections. Test performance is not FDA approved in patients less than 43 years old. Performed at [facility], [address] [place], [place] [address]    Time coordinating discharge: 45  mins  SIGNED:  Afton Louder, MD  [facility] 08/02/2024, 10:40 AM How to contact the [facility] Attending or Consulting provider 7A - 7P or covering provider during after hours 7P -7A, for this patient?  Check the care team in [facility] and look for a) attending/consulting [facility] provider listed and b) the [facility] team listed Log into [email or web address] and use [facility] universal password to access. If you do not have the  password, please contact the hospital operator. Locate the [facility] provider you are looking for under [facility] and page to a number that you can be directly reached. If you still have difficulty reaching the provider, please page the [facility] (Director on Call) for the Hospitalists listed on amion for assistance.

## 2024-08-03 LAB — CULTURE, BLOOD (ROUTINE X 2)
Culture: NO GROWTH
Culture: NO GROWTH
Special Requests: ADEQUATE
Special Requests: ADEQUATE
